# Patient Record
Sex: Male | Born: 1955 | Race: Black or African American | Hispanic: No | Marital: Single
Health system: Southern US, Community
[De-identification: ages and names within clinical notes are randomized; demographics above are authoritative.]

## PROBLEM LIST (undated history)

## (undated) DIAGNOSIS — M1990 Unspecified osteoarthritis, unspecified site: Secondary | ICD-10-CM

## (undated) DIAGNOSIS — I10 Essential (primary) hypertension: Secondary | ICD-10-CM

## (undated) DIAGNOSIS — F419 Anxiety disorder, unspecified: Secondary | ICD-10-CM

## (undated) DIAGNOSIS — F32A Depression, unspecified: Secondary | ICD-10-CM

## (undated) DIAGNOSIS — F329 Major depressive disorder, single episode, unspecified: Secondary | ICD-10-CM

## (undated) DIAGNOSIS — Z89619 Acquired absence of unspecified leg above knee: Secondary | ICD-10-CM

## (undated) HISTORY — PX: OTHER SURGICAL HISTORY: SHX169

## (undated) HISTORY — DX: Essential (primary) hypertension: I10

## (undated) HISTORY — DX: Acquired absence of unspecified leg above knee: Z89.619

## (undated) HISTORY — PX: INGUINAL HERNIA REPAIR: SUR1180

---
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
## 2006-02-09 ENCOUNTER — Ambulatory Visit: Payer: Self-pay | Admitting: Internal Medicine

## 2006-02-16 ENCOUNTER — Ambulatory Visit: Payer: Self-pay | Admitting: Internal Medicine

## 2007-08-16 DIAGNOSIS — Z89519 Acquired absence of unspecified leg below knee: Secondary | ICD-10-CM | POA: Insufficient documentation

## 2007-08-16 DIAGNOSIS — Z87898 Personal history of other specified conditions: Secondary | ICD-10-CM | POA: Insufficient documentation

## 2007-08-22 ENCOUNTER — Ambulatory Visit: Payer: Self-pay | Admitting: Internal Medicine

## 2007-08-22 DIAGNOSIS — E669 Obesity, unspecified: Secondary | ICD-10-CM

## 2007-08-28 LAB — CONVERTED CEMR LAB
BUN: 11 mg/dL (ref 6–23)
Basophils Absolute: 0 10*3/uL (ref 0.0–0.1)
CO2: 30 meq/L (ref 19–32)
Cholesterol: 222 mg/dL (ref 0–200)
Creatinine, Ser: 1.1 mg/dL (ref 0.4–1.5)
Eosinophils Relative: 2.9 % (ref 0.0–5.0)
GFR calc Af Amer: 91 mL/min
Glucose, Bld: 80 mg/dL (ref 70–99)
HCT: 41.5 % (ref 39.0–52.0)
HDL: 45.2 mg/dL (ref >39.0)
Hemoglobin: 13.9 g/dL (ref 13.0–17.0)
Lymphocytes Relative: 41.1 % (ref 12.0–46.0)
MCHC: 33.5 g/dL (ref 30.0–36.0)
MCV: 83.2 fL (ref 78.0–100.0)
Monocytes Absolute: 0.4 10*3/uL (ref 0.2–0.7)
Neutro Abs: 2.8 10*3/uL (ref 1.4–7.7)
Neutrophils Relative %: 48.5 % (ref 43.0–77.0)
Potassium: 4.2 meq/L (ref 3.5–5.1)
RBC: 4.99 M/uL (ref 4.22–5.81)
VLDL: 23 mg/dL (ref 0–40)
WBC: 5.7 10*3/uL (ref 4.5–10.5)

## 2007-09-11 ENCOUNTER — Encounter: Payer: Self-pay | Admitting: Internal Medicine

## 2007-10-26 DIAGNOSIS — I10 Essential (primary) hypertension: Secondary | ICD-10-CM

## 2007-10-26 HISTORY — DX: Essential (primary) hypertension: I10

## 2008-04-25 ENCOUNTER — Encounter: Payer: Self-pay | Admitting: Internal Medicine

## 2008-07-24 ENCOUNTER — Telehealth: Payer: Self-pay | Admitting: Internal Medicine

## 2008-07-31 ENCOUNTER — Ambulatory Visit: Payer: Self-pay | Admitting: Internal Medicine

## 2008-07-31 DIAGNOSIS — I10 Essential (primary) hypertension: Secondary | ICD-10-CM | POA: Insufficient documentation

## 2008-08-06 LAB — CONVERTED CEMR LAB
Calcium: 9.5 mg/dL (ref 8.4–10.5)
GFR calc Af Amer: 82 mL/min
GFR calc non Af Amer: 68 mL/min

## 2008-08-28 ENCOUNTER — Encounter (INDEPENDENT_AMBULATORY_CARE_PROVIDER_SITE_OTHER): Payer: Self-pay | Admitting: *Deleted

## 2008-08-30 ENCOUNTER — Encounter: Payer: Self-pay | Admitting: Internal Medicine

## 2008-10-09 ENCOUNTER — Telehealth: Payer: Self-pay | Admitting: Internal Medicine

## 2009-02-26 ENCOUNTER — Encounter: Payer: Self-pay | Admitting: Internal Medicine

## 2009-03-20 ENCOUNTER — Encounter: Payer: Self-pay | Admitting: Internal Medicine

## 2009-03-21 ENCOUNTER — Telehealth (INDEPENDENT_AMBULATORY_CARE_PROVIDER_SITE_OTHER): Payer: Self-pay | Admitting: *Deleted

## 2009-08-27 ENCOUNTER — Ambulatory Visit: Payer: Self-pay | Admitting: Internal Medicine

## 2009-09-15 ENCOUNTER — Encounter: Payer: Self-pay | Admitting: Internal Medicine

## 2009-09-29 ENCOUNTER — Encounter: Payer: Self-pay | Admitting: Internal Medicine

## 2010-01-29 ENCOUNTER — Encounter: Payer: Self-pay | Admitting: Internal Medicine

## 2010-01-29 ENCOUNTER — Telehealth (INDEPENDENT_AMBULATORY_CARE_PROVIDER_SITE_OTHER): Payer: Self-pay | Admitting: *Deleted

## 2010-01-30 ENCOUNTER — Encounter (INDEPENDENT_AMBULATORY_CARE_PROVIDER_SITE_OTHER): Payer: Self-pay | Admitting: *Deleted

## 2010-04-22 ENCOUNTER — Ambulatory Visit: Payer: Self-pay | Admitting: Internal Medicine

## 2010-04-22 DIAGNOSIS — F528 Other sexual dysfunction not due to a substance or known physiological condition: Secondary | ICD-10-CM | POA: Insufficient documentation

## 2010-04-28 ENCOUNTER — Encounter (INDEPENDENT_AMBULATORY_CARE_PROVIDER_SITE_OTHER): Payer: Self-pay | Admitting: *Deleted

## 2010-05-06 ENCOUNTER — Ambulatory Visit: Payer: Self-pay | Admitting: Internal Medicine

## 2010-05-07 ENCOUNTER — Ambulatory Visit: Payer: Self-pay | Admitting: Internal Medicine

## 2010-05-07 DIAGNOSIS — S0003XA Contusion of scalp, initial encounter: Secondary | ICD-10-CM | POA: Insufficient documentation

## 2010-05-07 DIAGNOSIS — S0083XA Contusion of other part of head, initial encounter: Secondary | ICD-10-CM

## 2010-05-07 DIAGNOSIS — S1093XA Contusion of unspecified part of neck, initial encounter: Secondary | ICD-10-CM

## 2010-05-08 LAB — CONVERTED CEMR LAB
ALT: 17 units/L (ref 0–53)
Basophils Absolute: 0 10*3/uL (ref 0.0–0.1)
Basophils Relative: 0.6 % (ref 0.0–3.0)
Calcium: 9.6 mg/dL (ref 8.4–10.5)
Cholesterol: 244 mg/dL — ABNORMAL HIGH (ref 0–200)
Creatinine, Ser: 1.1 mg/dL (ref 0.4–1.5)
Eosinophils Relative: 2.7 % (ref 0.0–5.0)
Glucose, Bld: 98 mg/dL (ref 70–99)
HCT: 43.3 % (ref 39.0–52.0)
Hemoglobin: 14.1 g/dL (ref 13.0–17.0)
Lymphocytes Relative: 30.5 % (ref 12.0–46.0)
Lymphs Abs: 1.8 10*3/uL (ref 0.7–4.0)
MCHC: 32.4 g/dL (ref 30.0–36.0)
MCV: 85.3 fL (ref 78.0–100.0)
Monocytes Absolute: 0.5 10*3/uL (ref 0.1–1.0)
Monocytes Relative: 9.1 % (ref 3.0–12.0)
Neutro Abs: 3.3 10*3/uL (ref 1.4–7.7)
Neutrophils Relative %: 57.1 % (ref 43.0–77.0)
Platelets: 178 10*3/uL (ref 150.0–400.0)
Potassium: 4.1 meq/L (ref 3.5–5.1)
Total CHOL/HDL Ratio: 5
Triglycerides: 130 mg/dL (ref 0.0–149.0)

## 2010-05-15 ENCOUNTER — Telehealth: Payer: Self-pay | Admitting: Family Medicine

## 2010-05-16 ENCOUNTER — Ambulatory Visit: Payer: Self-pay | Admitting: Family Medicine

## 2010-05-16 DIAGNOSIS — R209 Unspecified disturbances of skin sensation: Secondary | ICD-10-CM | POA: Insufficient documentation

## 2010-05-16 DIAGNOSIS — S060XAA Concussion with loss of consciousness status unknown, initial encounter: Secondary | ICD-10-CM | POA: Insufficient documentation

## 2010-05-16 DIAGNOSIS — S060X9A Concussion with loss of consciousness of unspecified duration, initial encounter: Secondary | ICD-10-CM

## 2010-05-28 ENCOUNTER — Telehealth (INDEPENDENT_AMBULATORY_CARE_PROVIDER_SITE_OTHER): Payer: Self-pay | Admitting: *Deleted

## 2010-06-05 ENCOUNTER — Telehealth: Payer: Self-pay | Admitting: Internal Medicine

## 2010-06-08 ENCOUNTER — Encounter: Admission: RE | Admit: 2010-06-08 | Discharge: 2010-06-08 | Payer: Self-pay | Admitting: Internal Medicine

## 2010-06-08 ENCOUNTER — Encounter: Payer: Self-pay | Admitting: Internal Medicine

## 2010-06-15 ENCOUNTER — Ambulatory Visit: Payer: Self-pay | Admitting: Internal Medicine

## 2010-06-16 ENCOUNTER — Encounter: Payer: Self-pay | Admitting: Internal Medicine

## 2010-08-13 ENCOUNTER — Encounter: Payer: Self-pay | Admitting: Internal Medicine

## 2010-09-01 ENCOUNTER — Encounter: Payer: Self-pay | Admitting: Internal Medicine

## 2010-11-24 NOTE — Consult Note (Signed)
Summary: ulnar neuropathy, RX [facility]  [facility]   Imported By: Lanelle Bal 08/25/2010 10:16:03  _____________________________________________________________________  External Attachment:    Type:   Image     Comment:   External Document

## 2010-11-24 NOTE — Assessment & Plan Note (Signed)
Summary: headache/cbs   Vital Signs:  Patient profile:   55 year old male Weight:      254 pounds Temp:     97.3 degrees F oral Pulse rate:   67 / minute Pulse rhythm:   regular BP sitting:   140 / 88  (left arm) Cuff size:   large  Vitals Entered By: [name] CMA (May 07, 2010 2:10 PM) CC: Pt here hit head 3-4 weeks ago had a HA that went away now has returned.  Comments - He feels it on the top of his head. -Waking up with it in the am.    History of Present Illness: 4 weeks ago while going down the stairs, he had an accident and fell forward, he hit the wall with the top of his head. It hurt, he felt dizzy the rest of the day. The majority of the  pain went away in 48 hours however since then, on and off, he has a pain in the top of his head right at the place where he hit the wall.  ROS No further dizziness No neck pain No nausea vomiting  Allergies (verified): No Known Drug Allergies  Past History:  Past Medical History: Reviewed history from 07/31/2008 and no changes required. 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 ***R***  BKA PMH-FH-SH reviewed for relevance  Social History: Reviewed history from 04/22/2010 and no changes required. Single 4 kids, boys  tobacco-- no ETOH-- no exercise-- active  diet--  does watch  occupation-- painter   Review of Systems      See HPI  Physical Exam  General:  alert, well-developed, and well-nourished.   Head:  palpation of the top of the head showed no mass, crepitus, deformities Neurologic:  alert & oriented X3, cranial nerves II-XII intact, strength normal in all extremities, and gait normal.   Psych:  Oriented X3, good eye contact, not anxious appearing, and not depressed appearing.     Impression & Recommendations:  Problem # 1:  CONTUSION, HEAD (ICD-920) head contusion, pain seems more local (  skeletal)  that an  actual  brain contusion plan: Ibuprofen CT of the head without to be sure that area is okay If the CT is negative and the pain continued for more than 4 weeks, he will let me know  Orders: Radiology Referral (Radiology)  Complete Medication List: 1)  Prosthesis For The Right Leg  .... Reason: loss of le volume, current prosthesis is loose 2)  Felodipine 5 Mg Xr24h-tab (Felodipine) .[name].. 1 by mouth once daily 3)  Viagra 100 Mg Tabs (Sildenafil citrate) .... Half or one tablet as directed once a day

## 2010-11-24 NOTE — Progress Notes (Signed)
Summary: MRI ORDER FROM 05-16-2010  Phone Note Call from Patient Call back at [phone]   Caller: Patient Summary of Call: Pt left voice message requesting a return call re: status of MRI appt.  See phone note of 05/28/10.  Left message on machine to return my call. Nicki Guadalajara Fergerson CMA Duncan Dull)  June 05, 2010 12:05 PM   Follow-up for Phone Call        Pt needs his MRI scheduled- he states he has not heard anything. [name] CMA  June 05, 2010 2:21 PM   Additional Follow-up for Phone Call Additional follow up Details #1::        FYI......Marland KitchenDr. Drue Novel authorized MRI to be ordered through phone note on 05-28-2010, but MRI was suggested on 05-16-2010 when patient was seen by Seymour Bars, DO.  Referral was never entered into EMR, but per phone note 05-28-2010, looks like info was faxed to [facility] for them to contact patient, they do not contact patient's to schedule.  It has been almost 3 weeks since MRI was supposed to be scheduled.  Ok for me to enter the referral? Additional Follow-up by: Magdalen Spatz [facility],  June 05, 2010 2:49 PM    Additional Follow-up for Phone Call Additional follow up Details #2::    Yes. [name] CMA  June 05, 2010 2:52 PM yes , see previos note Jerami Tammen E. Jeslyn Amsler MD  June 07, 2010 10:14 AM     Additional Follow-up for Phone Call Additional follow up Details #3:: Details for Additional Follow-up Action Taken: PATIENT'S APPT IS 06-08-2010, ARRIVE 8:45PM FOR A 9PM MRI.  ALSO, APPROVED BY INSURANCE.  I WILL INFORM PATIENT.  Additional Follow-up by: Magdalen Spatz [facility],  June 08, 2010 9:00 AM

## 2010-11-24 NOTE — Letter (Signed)
Summary: [facility]  [facility]   Imported By: Lanelle Bal 02/04/2010 08:49:51  _____________________________________________________________________  External Attachment:    Type:   Image     Comment:   External Document

## 2010-11-24 NOTE — Letter (Signed)
Summary: *Referral Letter  [facility]  [address] [place], [place] [address]   Phone: [phone]  Fax: [phone]    06/16/2010 Neurology Thank you in advance for agreeing to see my patient:  Russell Garcia [address] [place], [place]  [address]  Phone: [phone]  Reason for Referral:  55 year old gentleman who fell down stairs ( late June) and injured his head; since then has  developed a headache . He was seen at the office 4 weeks after the accident on 05-07-10, a CT of the head was done and it was normal. He was re-evaluated later with numbness in the upper extremity, a  MRI was done  06-08-10 and is reported as follows:  Impression 1.  No evidence of acute ischemia. 2.  Non specific Frieze matter changes probably represent areas of ischemic gliosis due to small vessel disease due to hypertension and/or diabetes with less likely possibility of a demyelinating process or vasculitis. 3. Moderate inflammatory thickening of the mucosa of the paranasal sinuses. 4.  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  please evaluate the patient in reference to his persistent headache and upper extremity numbness.      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  Sincerely,  Jose E. Paz MD

## 2010-11-24 NOTE — Letter (Signed)
Summary: [facility]: Immunoassay Fecal Occult Blood (iFOB) Order Form  [facility]  [address] [place], [place] [address]   Phone: [phone]  Fax: [phone]       Lab: Immunoassay Fecal Occult Blood (iFOB) Order Form   April 28, 2010 MRN: [id number]   Kamryn Brisbon [birth year]   Physicican Name:____jose,paz md_____________________  Diagnosis Code:_____v76.51_____________________      [name] CMA

## 2010-11-24 NOTE — Assessment & Plan Note (Signed)
Summary: finger numbness///lch   Vital Signs:  Patient profile:   55 year old male Height:      72 inches Weight:      253.50 pounds BMI:     34.51 O2 Sat:      97 % on Room air Temp:     98.2 degrees F oral Pulse rate:   77 / minute BP sitting:   138 / 90  (left arm) Cuff size:   large  Vitals Entered By: Margaret Pyle, CMA (May 16, 2010 9:49 AM)  O2 Flow:  Room air CC: Numbness & Tingling of LT ring, little finger x 4-6 wks/ DBD   Primary Care Provider:  Nolon Rod. Paz MD  CC:  Numbness & Tingling of LT ring and little finger x 4-6 wks/ DBD.  History of Present Illness: 55 yo AAM presents for a head injury 6 wks ago.  He was walking up steps and hit his head on a brick wall over the top of his head.  He had a CT scan done 3 wks ago for new onset HAs and it was normal.  He started having L 5th lateral 4th digit on the L numbness and tingling x 4 days.  Denies any weakness in the hand.  he is R handed.  His headaches have continued.  Taking ASA and tylenol.  Has a little occiptial neck pain.  Denies any vision change.  No nausea.  Unsure of any memory problems.  He had no LOC with the original injury.  Allergies (verified): No Known Drug Allergies  Past History:  Past Medical History: Reviewed history from 07/31/2008 and no changes required. 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 ***R***  BKA  Social History: Reviewed history from 04/22/2010 and no changes required. Single 4 kids, boys  tobacco-- no ETOH-- no exercise-- active  diet--  does watch  occupation-- painter   Review of Systems      See HPI  Physical Exam  General:  alert, well-developed, well-nourished, and well-hydrated.   Head:  normocephalic and atraumatic.  tender midline parietal region Eyes:  pupils equal, pupils round, and pupils reactive to light.   Mouth:  pharynx pink and  moist.   Neck:  supple, full ROM, and no masses.   Lungs:  Normal respiratory effort, chest expands symmetrically. Lungs are clear to auscultation, no crackles or wheezes. Heart:  normal rate, regular rhythm, and no murmur.   Msk:  full L UE active ROM Pulses:  2+ radial and ulnar  pulses Extremities:  no UE edema Neurologic:  cranial nerves II-XII intact and gait normal.  subjective decreased sensation to touch over the L 4th and 5th digits with full strength and ROM Skin:  color normal.   Psych:  good eye contact, not anxious appearing, and not depressed appearing.     Impression & Recommendations:  Problem # 1:  NUMBNESS (ICD-782.0) Assessment New 4th and 5th digit subjective nubmenss, new with normal strength and ROM in the L hand.  BP at goal.  CT brain normal 3 wks ago.  Will need MRI brain to r/o central process.   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  Reviewed his normal Ct brain from 3 wks ago.  Will start him  on Topiramate for chronic daily HA - 25 mg at bedtime x 1 wk then increase to 50 mg at bedtime in order to cut back on use of ASA and NSAIDs.  He is to f/u with Dr Drue Novel in the next wk.  Should be able to taper off Topiramate as post concussive HAs improve. Orders: T-MRI Head w/o contrast ([address])  Complete Medication List: 1)  Prosthesis For The Right Leg  .... Reason: loss of le volume, current prosthesis is loose 2)  Felodipine 5 Mg Xr24h-tab (Felodipine) .[name].. 1 by mouth once daily 3)  Viagra 100 Mg Tabs (Sildenafil citrate) .... Half or one tablet as directed once a day 4)  Topiramate 25 Mg Tabs (Topiramate) .[name].. 1 tab by mouth at bedtime x 1 wk then increase to 2 tabs by mouth qhs 5)  Tramadol Hcl 50 Mg Tabs (Tramadol hcl) .[name].. 1 tab by mouth two times a day as needed headache pain  Patient Instructions: 1)  Will set you MRI brain for next  wk thru Dr Leta Jungling office. 2)  Start on Topiramate at bedtime for HA prevention. 3)  Take 1 tab for the first wk then go up to 2 tabs. 4)  For HA pain, use Tramadol as needed. 5)  Return to see Dr Drue Novel for post concussive syndrome, finger numbness  next wk. Prescriptions: TRAMADOL HCL 50 MG TABS (TRAMADOL HCL) 1 tab by mouth two times a day as needed headache pain  #40 x 0   Entered and Authorized by:   Seymour Bars DO   Signed by:   Seymour Bars DO on 05/16/2010   Method used:   [facility]        [facility] [address] [phone]* (retail)       [address]       [place], [place]  [address]       Ph: [phone] or [phone]       Fax: [phone]   RxID:   [id number] TOPIRAMATE 25 MG TABS (TOPIRAMATE) 1 tab by mouth at bedtime x 1 wk then increase to 2 tabs by mouth qhs  #60 x 0   Entered and Authorized by:   Seymour Bars DO   Signed by:   Seymour Bars DO on 05/16/2010   Method used:   [facility]        [facility] [address] [phone]* (retail)       [address]       [place], [place]  [address]       Ph: [phone] or [phone]       Fax: [phone]   RxID:   [id number]

## 2010-11-24 NOTE — Miscellaneous (Signed)
Summary: Orders Update   Clinical Lists Changes  Orders: Added new Referral order of Radiology Referral (Radiology) - Signed 

## 2010-11-24 NOTE — Progress Notes (Signed)
Summary: Finger numbness  Phone Note Call from Patient Call back at Home Phone [phone]   Caller: Patient Summary of Call: Patient called c/o of numbesss in 3 of fingers in his left hand. He also need his BP medicine refilled because he lost it. FELODIPINE 5 MG XR24H-TAB. Please advise. Initial call taken by: Harold Barban,  May 15, 2010 3:31 PM  Follow-up for Phone Call        pt needs to be seen at Saturday clinic or UC for evaluation of sxs.  unable to give advice over the phone w/out hx- elbow/shoulder/neck/wrist injury that could be compressing nerve.  ok to refill meds Follow-up by: Neena Rhymes MD,  May 15, 2010 3:53 PM  Additional Follow-up for Phone Call Additional follow up Details #1::        Patient is ok with a Saturday clinic appt.   [facility] [address]  Additional Follow-up by: Harold Barban,  May 15, 2010 4:17 PM    Additional Follow-up for Phone Call Additional follow up Details #2::    Patient appt has been made for sat clinic and per Dr. Beverely Low it is ok to refill. Done. Follow-up by: Lucious Groves CMA,  May 15, 2010 4:23 PM  Prescriptions: FELODIPINE 5 MG XR24H-TAB (FELODIPINE) 1 by mouth once daily  #30 Tablet x 1   Entered by:   Lucious Groves CMA   Authorized by:   Neena Rhymes MD   Signed by:   Lucious Groves CMA on 05/15/2010   Method used:   [facility]        [facility] [address] [phone]* (retail)       [address]       [place], [place]  [address]       Ph: [phone] or [phone]       Fax: [phone]   RxID:   [id number]

## 2010-11-24 NOTE — Progress Notes (Signed)
Summary: due cpx  Phone Note Outgoing Call Call back at [facility] Phone [phone] Call back at Work Phone [phone]   Summary of Call: Patient is due CPX with Dr. Alanson Aly [facility]  January 29, 2010 8:55 AM     Additional Follow-up for Phone Call Additional follow up Details #2::    LMTCB Follow-up by: Barb Merino,  January 29, 2010 8:57 AM  Additional Follow-up for Phone Call Additional follow up Details #3:: Details for Additional Follow-up Action Taken: mailed a letter Additional Follow-up by: Barb Merino,  January 30, 2010 10:26 AM

## 2010-11-24 NOTE — Assessment & Plan Note (Signed)
Summary: discuss MRI results//lch   Vital Signs:  Patient profile:   55 year old male Weight:      259 pounds Pulse rate:   90 / minute Pulse rhythm:   regular BP sitting:   132 / 80  (left arm) Cuff size:   large  Vitals Entered By: [name] CMA (June 15, 2010 8:32 AM) CC: Discuss MRI results- Fasting    History of Present Illness: here to discuss the recent brain MRI   Current Medications (verified): 1)  Prosthesis For The Right Leg .... Reason: Loss of Le Volume, Current Prosthesis Is Loose 2)  Felodipine 5 Mg Xr24h-Tab (Felodipine) .[name].. 1 By Mouth Once Daily 3)  Viagra 100 Mg Tabs (Sildenafil Citrate) .... Half or One Tablet As Directed Once A Day 4)  Tramadol Hcl 50 Mg Tabs (Tramadol Hcl) .[name].. 1 Tab By Mouth Two Times A Day As Needed Headache Pain  Allergies (verified): No Known Drug Allergies  Past History:  Past Medical History: Reviewed history from 07/31/2008 and no changes required. 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 ***R***  BKA  Social History: Reviewed history from 04/22/2010 and no changes required. Single 4 kids, boys  tobacco-- no ETOH-- no exercise-- active  diet--  does watch  occupation-- painter   Review of Systems       Continue with headaches daily he also developed   left hand numbness, symptoms resolved except for persistent numbness of the fourth and fifth left fingers Mild neck pain, denies left elbow pain he was prescribed Topamax for prevention of headaches but he never tried them He was prescribed Ultram for pain as well but it doesn't help any better than an aspirin does. Patient self discontinued  Physical Exam  General:  alert and well-developed.   Neurologic:  alert & oriented X3.   EOMI motor and face symetric DTRs UEs normal pinprick UE normal as well  Psych:  Oriented X3, memory intact for recent  and remote, normally interactive, good eye contact, not anxious appearing, and not depressed appearing.     Impression & Recommendations:  Problem # 1:  CONCUSSION (ICD-850.9)  persistent headache after fall and head injury now also complaining of left upper extremity paresthesias MRI is not completely normal ----> see report  Plan: neurology referral, see   letter  Orders: Neurology Referral (Neuro)  Problem # 2:  NUMBNESS (ICD-782.0) see #1  Complete Medication List: 1)  Prosthesis For The Right Leg  .... Reason: loss of le volume, current prosthesis is loose 2)  Felodipine 5 Mg Xr24h-tab (Felodipine) .[name].. 1 by mouth once daily 3)  Viagra 100 Mg Tabs (Sildenafil citrate) .... Half or one tablet as directed once a day 4)  Tramadol Hcl 50 Mg Tabs (Tramadol hcl) .[name].. 1 tab by mouth two times a day as needed headache pain

## 2010-11-24 NOTE — Assessment & Plan Note (Signed)
Summary: CPX,WILL BE FASTING, [employer] INS/RH......   Vital Signs:  Patient profile:   55 year old male Height:      72 inches Weight:      255.50 pounds BMI:     34.78 Pulse rate:   74 / minute Pulse rhythm:   regular BP sitting:   142 / 88  (left arm) Cuff size:   large  Vitals Entered By: [name] CMA (April 22, 2010 12:46 PM) CC: CPX: not fasting   History of Present Illness: CPX c/o ED since he started BP meds decrease quality of erections no problems w/  libido   Allergies (verified): No Known Drug Allergies  Past History:  Past Medical History: Reviewed history from 07/31/2008 and no changes required. 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 leukemia-- F MI-- mother (early 55 y/o?) DM--no colon ca--no prostate ca--no  Social History: Single 4 kids, boys  tobacco-- no ETOH-- no exercise-- active  diet--  does watch  occupation-- Education administrator   Review of Systems General:  Denies fatigue and fever; some wt loss, eating healthier . CV:  Denies chest pain or discomfort and swelling of feet. Resp:  Denies cough and shortness of breath. GI:  Denies bloody stools, diarrhea, nausea, and vomiting. GU:  Denies dysuria, hematuria, urinary frequency, and urinary hesitancy. Psych:  Denies anxiety and depression.  Physical Exam  General:  alert and well-developed.   Neck:  no masses, no thyromegaly, and normal carotid upstroke.   Lungs:  normal respiratory effort, no intercostal retractions, no accessory muscle use, and normal breath sounds.   Heart:  normal rate, regular rhythm, and no murmur.   Abdomen:  soft, non-tender, no distention, no masses, no guarding, and no rigidity.   Rectal:  No external abnormalities noted. Normal sphincter tone. No rectal masses or tenderness. Hemoccult negative Prostate:  Prostate gland firm  and smooth, no enlargement, nodularity, tenderness, mass, asymmetry or induration. Extremities:  no lower extremity edema on the left Psych:  Cognition and judgment appear intact. Alert and cooperative with normal attention span and concentration. not anxious appearing and not depressed appearing.     Impression & Recommendations:  Problem # 1:  HEALTH SCREENING (ICD-V70.0) Td 07 never Cscope  Colonoscopy Vs.iFOB cards reviewed w/ pt. Provided  iFOB but he will  call if he decides to have a  colonoscopy  encouraged to continue his healthy lifestyle, labs  Problem # 2:  HYPERTENSION (ICD-401.9) see instructions His updated medication list for this problem includes:    Felodipine 5 Mg Xr24h-tab (Felodipine) .[name]... 1 by mouth once daily  BP today: 142/88 Prior BP: 170/110 (08/27/2009)  Labs Reviewed: K+: 3.7 (07/31/2008) Creat: : 1.2 (07/31/2008)   Chol: 222 (08/22/2007)   HDL: 45.2 (08/22/2007)   LDL: DEL (08/22/2007)   TG: 113 (08/22/2007)  Problem # 5:  ERECTILE DYSFUNCTION, NON-ORGANIC (ICD-302.72) problems with erections since he stopped taking BP meds Trial with Viagra, how to use it  and side effects discussed  His updated medication list for this problem includes:    Viagra 100 Mg Tabs (Sildenafil citrate) ..... Half or one tablet as directed once a day  Problem # 6:  AMPUTATION, BELOW KNEE, RIGHT, HX OF (ICD-V49.75) has a relatively new prosthesis Complaining of sweating a lot with it He will call if he needs a prescription to switch to  a new model that make sweating less of a problem  Complete Medication List: 1)  Prosthesis For The Right Leg  .... Reason: loss of le volume, current prosthesis is loose 2)  Felodipine 5 Mg Xr24h-tab (Felodipine) .[name].. 1 by mouth once daily 3)  Viagra 100 Mg Tabs (Sildenafil citrate) .... Half or one tablet as directed once a day  Patient Instructions: 1)  Check your blood pressure 2 or 3 times a week. If it is more than 140/85  consistently,please let us know 2)  ------------------------------------------------ 3)  come back fasting for blood work on him 4)  Dx V70: FLP, AST, ALT, BMP, CBC, TSH, PSA 5)  ------------------------------------------------ 6)  Please schedule a follow-up appointment in 6 months .  Prescriptions: VIAGRA 100 MG TABS (SILDENAFIL CITRATE) half or one tablet as directed once a day  #10 x 6   Entered and Authorized by:   Elita Quick E. Paz MD   Signed by:   Nolon Rod. Paz MD on 04/22/2010   Method used:   Print then Give to Patient   RxID:   [id number]

## 2010-11-24 NOTE — Letter (Signed)
Summary: Primary Care Appointment Letter  [facility]  [address] [place], [place] [address]   Phone: [phone]  Fax: [phone]    01/30/2010 MRN: [id number]  [facility] [place] [address] [place], [place]  [address]  Dear Mr. TIPPS,   Your Primary Care Physician Mayfield E. Paz MD has indicated that:    ____x___it is time to schedule an appointment. Due for a physical    _______you missed your appointment on______ and need to call and          reschedule.    _______you need to have lab work done.    _______you need to schedule an appointment discuss lab or test results.    _______you need to call to reschedule your appointment that is                       scheduled on _________.     Please call our office as soon as possible. Our phone number is [phone]______. Please press option 1. Our office is open 8a-12noon and 1p-5p, Monday through Friday.     Thank you,    [facility]

## 2010-11-24 NOTE — Progress Notes (Signed)
Summary: MRI  Phone Note Call from Patient Call back at Home Phone [phone]   Caller: Patient Summary of Call: Patient called about his MRI that was supposed to sch. He was seen @ the Saturday clinic on 7.23.11 for numbness in his fingers. The order was put in for the MRI but never set up. Not sure if patient was given the order to go to the hospital to have it done or if it was overlooked. Please adivse.  Initial call taken by: Harold Barban,  May 28, 2010 9:24 AM  Follow-up for Phone Call        okay to schedule the MRI as planned Be sure he has a followup with me after the MRI tell patient that if symptoms severe or getting worse, he needs to call us Follow-up by: [facility] E. Paz MD,  May 29, 2010 10:33 AM  Additional Follow-up for Phone Call Additional follow up Details #1::        Left detailed message informing him the order is sent to [facility] and they will be contacting him to sch appt.  Additional Follow-up by: Harold Barban,  May 29, 2010 10:40 AM

## 2011-02-19 ENCOUNTER — Other Ambulatory Visit: Payer: Self-pay | Admitting: Internal Medicine

## 2011-03-09 ENCOUNTER — Encounter: Payer: Self-pay | Admitting: Internal Medicine

## 2011-03-09 ENCOUNTER — Ambulatory Visit (INDEPENDENT_AMBULATORY_CARE_PROVIDER_SITE_OTHER): Payer: BC Managed Care – PPO | Admitting: Internal Medicine

## 2011-03-09 DIAGNOSIS — L309 Dermatitis, unspecified: Secondary | ICD-10-CM

## 2011-03-09 DIAGNOSIS — L259 Unspecified contact dermatitis, unspecified cause: Secondary | ICD-10-CM

## 2011-03-09 DIAGNOSIS — S0003XA Contusion of scalp, initial encounter: Secondary | ICD-10-CM

## 2011-03-09 DIAGNOSIS — S1093XA Contusion of unspecified part of neck, initial encounter: Secondary | ICD-10-CM

## 2011-03-09 DIAGNOSIS — Z0000 Encounter for general adult medical examination without abnormal findings: Secondary | ICD-10-CM | POA: Insufficient documentation

## 2011-03-09 DIAGNOSIS — E785 Hyperlipidemia, unspecified: Secondary | ICD-10-CM

## 2011-03-09 LAB — LIPID PANEL
Cholesterol: 194 mg/dL (ref 0–200)
LDL Cholesterol: 125 mg/dL — ABNORMAL HIGH (ref 0–99)
Total CHOL/HDL Ratio: 4
VLDL: 18.6 mg/dL (ref 0.0–40.0)

## 2011-03-09 NOTE — Assessment & Plan Note (Signed)
LDL in the 160s, + FH of CAD Recheck cholesterol, goal LDL <130, 100 if possible

## 2011-03-09 NOTE — Assessment & Plan Note (Signed)
He was seen last year after a concussion with persistent headache and upper extremity paresthesias. MRI 05/2010 was as follows: 1.  No evidence of acute ischemia. 2.  Non specific [place] matter changes probably represent areas of ischemic gliosis due to small vessel disease due to hypertension and/or diabetes with less likely possibility of a demyelinating process or vasculitis. 3. Moderate inflammatory thickening of the mucosa of the paranasal sinuses. 4.  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 He subsequently saw neurology. 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 It was felt that the problem was not related with a head concussion.

## 2011-03-09 NOTE — Progress Notes (Signed)
  Subjective:    Patient ID: Russell Garcia, male    DOB: 02-Nov-1955, 55 y.o.   MRN: [id number]  HPI Recently he lost a friend, he is quite concerned about his own health. Having a lot of problems with the skin in the stump, R leg . Has been using OTC cortisone creams. They are manufacturing a new prosthesis but in the meantime he is concerned. He wonders about skin cancer. He also has a skin lesion on the right eye that has been there for a while and would like it out. Also request a colonoscopy.  Past Medical History  Diagnosis Date  . Hypertension 2009  . 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 R bka      Review of Systems No chest pain or shortness of breath No nausea vomiting or diarrhea. Labs are reviewed, cholesterol was noted to be elevated in the past, he has a history of heart disease in the family. See assessment and plan.    Objective:   Physical Exam Alert oriented in no apparent distress. Skin at the tip of the right stump with chronic changes, slight maceration, no hyperpigmentation. He has a 2 mm cystlike lesion in the corner of the right eye.         Assessment & Plan:

## 2011-03-09 NOTE — Assessment & Plan Note (Signed)
Due for a CPX 6-12 but likes to go ahead and be referred for a Cscope----> done

## 2011-03-09 NOTE — Assessment & Plan Note (Addendum)
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 cyst in the corner of the right eye.  Refer to derm to be sure nothing serious is going on on the stump and for consideration of excision of a cyst close to the R eye although that may need to be performed by ophthalmology.

## 2011-03-10 ENCOUNTER — Encounter: Payer: Self-pay | Admitting: Internal Medicine

## 2011-03-11 ENCOUNTER — Telehealth: Payer: Self-pay | Admitting: *Deleted

## 2011-03-11 NOTE — Telephone Encounter (Signed)
Message copied by [name] on Thu Mar 11, 2011  9:49 AM ------      Message from: Russell Garcia      Created: Thu Mar 11, 2011  6:38 AM       Advise patient:      Cholesterol is better, good results!

## 2011-03-11 NOTE — Telephone Encounter (Signed)
Message left for patient to return my call.  

## 2011-03-12 NOTE — Telephone Encounter (Signed)
Message left for patient to return my call.  

## 2011-03-15 NOTE — Telephone Encounter (Signed)
Message left for patient to return my call.  

## 2011-03-17 ENCOUNTER — Encounter: Payer: Self-pay | Admitting: *Deleted

## 2011-03-17 NOTE — Telephone Encounter (Signed)
Will mail letter.  

## 2011-03-30 ENCOUNTER — Other Ambulatory Visit: Payer: Self-pay | Admitting: Internal Medicine

## 2011-03-31 ENCOUNTER — Telehealth: Payer: Self-pay | Admitting: *Deleted

## 2011-03-31 MED ORDER — FELODIPINE ER 5 MG PO TB24: 5.0000 mg | ORAL_TABLET | Freq: Every day | ORAL | Status: DC

## 2011-03-31 NOTE — Telephone Encounter (Signed)
Has CPX and labs for 05/31/2011

## 2011-03-31 NOTE — Telephone Encounter (Signed)
Sorry- sent in a 3 month supply. Yes just needs august appt.

## 2011-03-31 NOTE — Telephone Encounter (Signed)
Does patient need followup appt from 5/15 visit??   or ----can he wait until August because directions on 5/15 visit say to "return in about 3 months (around 06/09/2011) for a physical, fasting."  He says he keeps getting a one-month supply for his Felodipine instead of three months---if he makes an August appt, can he get a three month supply??

## 2011-03-31 NOTE — Telephone Encounter (Signed)
Pt is due for an appt.  

## 2011-04-05 ENCOUNTER — Telehealth: Payer: Self-pay | Admitting: *Deleted

## 2011-04-05 NOTE — Telephone Encounter (Signed)
Pt called and would like to speak only with doctor about recent visit and referral that was done. Please call pt.

## 2011-04-06 NOTE — Telephone Encounter (Signed)
LMOM. Asked pt to call in AM

## 2011-04-09 ENCOUNTER — Encounter: Payer: Self-pay | Admitting: Gastroenterology

## 2011-04-09 ENCOUNTER — Ambulatory Visit (AMBULATORY_SURGERY_CENTER): Payer: BC Managed Care – PPO | Admitting: *Deleted

## 2011-04-09 VITALS — Ht 72.0 in | Wt 262.0 lb

## 2011-04-09 DIAGNOSIS — Z1211 Encounter for screening for malignant neoplasm of colon: Secondary | ICD-10-CM

## 2011-04-09 MED ORDER — PEG-KCL-NACL-NASULF-NA ASC-C 100 G PO SOLR: ORAL | Status: DC

## 2011-04-23 ENCOUNTER — Other Ambulatory Visit: Payer: BC Managed Care – PPO | Admitting: Gastroenterology

## 2011-04-30 ENCOUNTER — Telehealth: Payer: Self-pay

## 2011-04-30 ENCOUNTER — Encounter: Payer: Self-pay | Admitting: Internal Medicine

## 2011-04-30 ENCOUNTER — Other Ambulatory Visit: Payer: BC Managed Care – PPO | Admitting: Internal Medicine

## 2011-04-30 NOTE — Telephone Encounter (Signed)
Multiple unsuccessful attempts made to reach pt

## 2011-05-31 ENCOUNTER — Encounter: Payer: BC Managed Care – PPO | Admitting: Internal Medicine

## 2011-05-31 DIAGNOSIS — Z0289 Encounter for other administrative examinations: Secondary | ICD-10-CM

## 2011-06-08 ENCOUNTER — Telehealth: Payer: Self-pay | Admitting: Internal Medicine

## 2011-06-08 NOTE — Telephone Encounter (Signed)
Pt called left msg on voicemail says he was returning Dr. Leta Jungling phone call don't see any information to indicate that Dr. Drue Novel had called except for on 04/05/11.   Left msg on voicemail to have pt return call.

## 2011-09-10 ENCOUNTER — Other Ambulatory Visit: Payer: Self-pay | Admitting: Internal Medicine

## 2011-09-10 MED ORDER — FELODIPINE ER 5 MG PO TB24: 5.0000 mg | ORAL_TABLET | Freq: Every day | ORAL | Status: DC

## 2011-09-10 NOTE — Telephone Encounter (Signed)
DONE

## 2011-11-27 ENCOUNTER — Other Ambulatory Visit: Payer: Self-pay | Admitting: Internal Medicine

## 2011-11-29 NOTE — Telephone Encounter (Signed)
Refill done.  

## 2012-02-02 ENCOUNTER — Telehealth: Payer: Self-pay | Admitting: Internal Medicine

## 2012-02-02 NOTE — Telephone Encounter (Signed)
Thank you :)

## 2012-02-02 NOTE — Telephone Encounter (Signed)
Patient made a CPE 5.17.13 @ 1pm, please note patient no longer has insurance & I did tell him he would have to pay $145.00 up front. I also told him to call pharmacy for refills, they would send electronic.

## 2012-02-27 ENCOUNTER — Other Ambulatory Visit: Payer: Self-pay | Admitting: Internal Medicine

## 2012-02-28 NOTE — Telephone Encounter (Signed)
Refill done.  

## 2012-03-10 ENCOUNTER — Encounter: Payer: BC Managed Care – PPO | Admitting: Internal Medicine

## 2012-03-10 DIAGNOSIS — Z0289 Encounter for other administrative examinations: Secondary | ICD-10-CM

## 2012-03-14 ENCOUNTER — Ambulatory Visit (INDEPENDENT_AMBULATORY_CARE_PROVIDER_SITE_OTHER): Payer: Self-pay | Admitting: Internal Medicine

## 2012-03-14 VITALS — BP 148/98 | HR 68 | Temp 97.5°F | Wt 267.0 lb

## 2012-03-14 DIAGNOSIS — S88119A Complete traumatic amputation at level between knee and ankle, unspecified lower leg, initial encounter: Secondary | ICD-10-CM

## 2012-03-14 DIAGNOSIS — I10 Essential (primary) hypertension: Secondary | ICD-10-CM

## 2012-03-14 MED ORDER — FELODIPINE ER 5 MG PO TB24: 5.0000 mg | ORAL_TABLET | Freq: Every day | ORAL | Status: DC

## 2012-03-14 NOTE — Assessment & Plan Note (Signed)
Restart medications, prescription provided. See  instructions.

## 2012-03-14 NOTE — Progress Notes (Signed)
  Subjective:    Patient ID: Russell Garcia, male    DOB: 25-Jan-1956, 56 y.o.   MRN: [id number]  HPI Routine office visit Hypertension, run out of BP meds few months ago, not ambulatory BPs here at BP today elevated. He has chronic leg pain, wonders about disability.  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 Tobacco-- no EtOH--no Occupation --Education administrator    Review of Systems  no chest pain or shortness of breath Occasional headache in the mornings.     Objective:   Physical Exam General -- alert, well-developed.No apparent distress.  Lungs -- normal respiratory effort, no intercostal retractions, no accessory muscle use, and normal breath sounds.   Heart-- normal rate, regular rhythm, no murmur, and no gallop.   Neurologic-- alert & oriented X3 Psych-- Cognition and judgment appear intact. Alert and cooperative with normal attention span and concentration.  not anxious appearing and not depressed appearing.      Assessment & Plan:

## 2012-03-14 NOTE — Patient Instructions (Addendum)
Check the  blood pressure 4 times a week, be sure it is between 110/60 and 140/85. If it is consistently higher or lower, let me know Schedule a fasting physical exam in 4 weeks

## 2012-03-14 NOTE — Assessment & Plan Note (Signed)
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 Wonders about disability. I recommended first to contact a lawyer if he is going to start the process. He will need a full PT/OT eval at some point

## 2012-03-15 ENCOUNTER — Encounter: Payer: Self-pay | Admitting: Internal Medicine

## 2012-06-21 ENCOUNTER — Ambulatory Visit: Payer: Self-pay | Admitting: Internal Medicine

## 2012-06-21 DIAGNOSIS — Z0289 Encounter for other administrative examinations: Secondary | ICD-10-CM

## 2012-09-20 ENCOUNTER — Telehealth: Payer: Self-pay | Admitting: Internal Medicine

## 2012-09-20 NOTE — Telephone Encounter (Signed)
Spoke with pt & he states he has personal questions for Dr. Drue Novel. Pt would also like to know if Dr. Drue Novel has received the papers for disability. Pt states that he does not have insurance at the time but will be getting some type of disability insurance and would like to if Dr. Drue Novel will continue to be his PCP. If so, the pt states he has noticed some blood in his urine & would like to schedule an appointment. Please advise.

## 2012-09-20 NOTE — Telephone Encounter (Signed)
Please schedule an appointment; if he has fever, chills or blood in the urine that is persistent or severe, needs to go to the ER.

## 2012-09-20 NOTE — Telephone Encounter (Signed)
Left detailed msg on pt's vmail.  

## 2012-09-20 NOTE — Telephone Encounter (Signed)
Pt called requesting to speak with Dr. Drue Novel regarding his prosthetic leg. Please call back at [phone]

## 2012-09-20 NOTE — Telephone Encounter (Signed)
lmovm for pt to return call.  

## 2012-09-22 ENCOUNTER — Telehealth: Payer: Self-pay | Admitting: Internal Medicine

## 2012-09-22 ENCOUNTER — Ambulatory Visit (INDEPENDENT_AMBULATORY_CARE_PROVIDER_SITE_OTHER): Payer: Self-pay | Admitting: Internal Medicine

## 2012-09-22 VITALS — BP 150/90 | HR 78 | Temp 97.7°F | Wt 260.0 lb

## 2012-09-22 DIAGNOSIS — S88119A Complete traumatic amputation at level between knee and ankle, unspecified lower leg, initial encounter: Secondary | ICD-10-CM

## 2012-09-22 DIAGNOSIS — R310 Gross hematuria: Secondary | ICD-10-CM

## 2012-09-22 DIAGNOSIS — I10 Essential (primary) hypertension: Secondary | ICD-10-CM

## 2012-09-22 LAB — POCT URINALYSIS DIPSTICK
Blood, UA: NEGATIVE
Glucose, UA: NEGATIVE
Nitrite, UA: NEGATIVE
Urobilinogen, UA: 0.2
pH, UA: 5

## 2012-09-22 MED ORDER — FELODIPINE ER 5 MG PO TB24: 10.0000 mg | ORAL_TABLET | Freq: Every day | ORAL | Status: DC

## 2012-09-22 NOTE — Assessment & Plan Note (Signed)
2 episodes of painless, gross hematuria this week, no other symptoms. Udip (-). Will check a urine culture, if culture negative will need further eval.

## 2012-09-22 NOTE — Telephone Encounter (Signed)
Patient returning a call, had a message that he needed to see Dr. Drue Novel but wasn't given a time today.  Per EPIC review, he had called on Wedneday with blood in his urine.  Scheduled for 1345 today with Dr. Drue Novel.

## 2012-09-22 NOTE — Assessment & Plan Note (Signed)
BP today slightly elevated, BP at home 140-150/85 Plan: Increase Plendil to 2 tablets a day. Strongly encouraged to come back for a physical within a month.

## 2012-09-22 NOTE — Patient Instructions (Addendum)
Increase plendil to 2 tablets daily. Check the  blood pressure 2 or 3 times a week, be sure it is between 110/60 and 140/80. If it is consistently higher or lower, let me know. You are overdue for a physical exam. Please schedule that within a month

## 2012-09-22 NOTE — Assessment & Plan Note (Signed)
To have a disability eval next week.

## 2012-09-22 NOTE — Progress Notes (Signed)
  Subjective:    Patient ID: Russell Garcia, male    DOB: 1956-03-06, 56 y.o.   MRN: [id number]  HPI Acute visit 2 days ago saw blood in the urine twice, since then the urine color is normal. No history of kidney stones but reports that he did have hematuria "sometimes" when he was a boy, unclear if he had a workup Also, he will have a disability eval next week related to the below knee amputation He still has some skin issues and make very difficult to use the prosthesis. BP noted to be elevated, see a/p   Past Medical History  Diagnosis Date  . Hypertension 2009  . 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 R bka   . Inguinal hernia repair    Past Surgical History  Procedure Date  . R bka   . Inguinal hernia repair      Review of Systems Denies fever or chills No nausea, vomiting, diarrhea. No abdominal pain, flank pain. No difficulty urinating.     Objective:   Physical Exam General -- alert, well-developed, and well-nourished.   Lungs -- normal respiratory effort, no intercostal retractions, no accessory muscle use, and normal breath sounds.   Heart-- normal rate, regular rhythm, no murmur, and no gallop.   Abdomen--soft, non-tender, no distention, no masses, no HSM, no guarding, and no rigidity.  No CVA tenderness Extremities-- stump with some chronic skin changes, at the tip. Some skin irritation throughout. Rectal-- No external abnormalities noted. Normal sphincter tone. No rectal masses or tenderness. Brown stool Prostate:  Prostate gland firm and smooth, no enlargement, nodularity, tenderness, mass, asymmetry or induration. Psych-- Cognition and judgment appear intact. Alert and cooperative with normal attention span and concentration.  not anxious appearing and not depressed appearing.       Assessment & Plan:

## 2012-09-24 ENCOUNTER — Encounter: Payer: Self-pay | Admitting: Internal Medicine

## 2012-09-24 LAB — URINE CULTURE: Organism ID, Bacteria: NO GROWTH

## 2012-09-28 NOTE — Addendum Note (Signed)
Addended by: Edwena Felty T on: 09/28/2012 03:25 PM   Modules accepted: Orders

## 2012-11-24 ENCOUNTER — Encounter: Payer: Self-pay | Admitting: Internal Medicine

## 2012-12-04 ENCOUNTER — Telehealth: Payer: Self-pay | Admitting: Internal Medicine

## 2012-12-04 ENCOUNTER — Encounter: Payer: Self-pay | Admitting: Internal Medicine

## 2012-12-04 NOTE — Telephone Encounter (Signed)
In reference to Urology referral entered on 09/28/12, patient was contacted to schedule an appointment with [facility] and did not, stated he would call them back in January-2014 to do so.  I have since left messages for patient, and mailed him a letter.  Patient will not respond.

## 2012-12-04 NOTE — Telephone Encounter (Signed)
I will send another letter, thx

## 2014-11-22 DIAGNOSIS — Z1211 Encounter for screening for malignant neoplasm of colon: Secondary | ICD-10-CM | POA: Diagnosis not present

## 2014-11-22 DIAGNOSIS — D123 Benign neoplasm of transverse colon: Secondary | ICD-10-CM | POA: Diagnosis not present

## 2014-11-22 DIAGNOSIS — K648 Other hemorrhoids: Secondary | ICD-10-CM | POA: Diagnosis not present

## 2014-11-22 DIAGNOSIS — K635 Polyp of colon: Secondary | ICD-10-CM | POA: Diagnosis not present

## 2014-11-22 LAB — HM COLONOSCOPY

## 2015-01-12 DIAGNOSIS — Y999 Unspecified external cause status: Secondary | ICD-10-CM | POA: Diagnosis not present

## 2015-01-12 DIAGNOSIS — S20362A Insect bite (nonvenomous) of left front wall of thorax, initial encounter: Secondary | ICD-10-CM | POA: Diagnosis not present

## 2015-01-12 DIAGNOSIS — S80862A Insect bite (nonvenomous), left lower leg, initial encounter: Secondary | ICD-10-CM | POA: Diagnosis not present

## 2015-01-12 DIAGNOSIS — S1096XA Insect bite of unspecified part of neck, initial encounter: Secondary | ICD-10-CM | POA: Diagnosis not present

## 2015-01-12 DIAGNOSIS — I10 Essential (primary) hypertension: Secondary | ICD-10-CM | POA: Diagnosis not present

## 2016-04-07 DIAGNOSIS — L03317 Cellulitis of buttock: Secondary | ICD-10-CM | POA: Insufficient documentation

## 2016-04-07 DIAGNOSIS — Z7982 Long term (current) use of aspirin: Secondary | ICD-10-CM | POA: Insufficient documentation

## 2016-04-07 DIAGNOSIS — I10 Essential (primary) hypertension: Secondary | ICD-10-CM | POA: Insufficient documentation

## 2016-04-07 DIAGNOSIS — E785 Hyperlipidemia, unspecified: Secondary | ICD-10-CM | POA: Insufficient documentation

## 2016-04-07 DIAGNOSIS — Z79899 Other long term (current) drug therapy: Secondary | ICD-10-CM | POA: Insufficient documentation

## 2016-04-07 NOTE — ED Notes (Signed)
Pt in with co abscess to left buttocks since yest, has drained some.

## 2016-04-08 ENCOUNTER — Emergency Department
Admission: EM | Admit: 2016-04-08 | Discharge: 2016-04-08 | Disposition: A | Discharge status: Home / Self Care | Payer: Medicare Other | Attending: Emergency Medicine | Admitting: Emergency Medicine

## 2016-04-08 DIAGNOSIS — L03317 Cellulitis of buttock: Secondary | ICD-10-CM

## 2016-04-08 MED ORDER — CEPHALEXIN 500 MG PO CAPS: 500.0000 mg | ORAL_CAPSULE | Freq: Four times a day (QID) | ORAL | Status: AC

## 2016-04-08 MED ORDER — SULFAMETHOXAZOLE-TRIMETHOPRIM 800-160 MG PO TABS
2.0000 | ORAL_TABLET | Freq: Once | ORAL | Status: AC
  Administered 2016-04-08: 2 via ORAL
  Filled 2016-04-08: qty 2

## 2016-04-08 MED ORDER — CEPHALEXIN 500 MG PO CAPS
500.0000 mg | ORAL_CAPSULE | Freq: Once | ORAL | Status: AC
  Administered 2016-04-08: 500 mg via ORAL
  Filled 2016-04-08: qty 1

## 2016-04-08 MED ORDER — SULFAMETHOXAZOLE-TRIMETHOPRIM 800-160 MG PO TABS: 2.0000 | ORAL_TABLET | Freq: Two times a day (BID) | ORAL | Status: DC

## 2016-04-08 NOTE — ED Provider Notes (Signed)
[facility] Emergency Department Provider Note   ____________________________________________  Time seen: Approximately 1:30 AM  I have reviewed the triage vital signs and the nursing notes.   HISTORY  Chief Complaint Abscess   HPI Russell Garcia is a 60 y.o. male with a history of hypertension who is presenting to the emergency department today with a left buttock abscess. He says there has been a small amount of drainage of pus and blood. He says that the skin around the areas hardened. He said he is also had itching to the area and thinks that something bit him there. He says that he actually felt the bite at the time but did not see the bug. Denies any fever. Denies having diabetes.   Past Medical History  Diagnosis Date  . Hypertension 2009  . 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 Gross hematuria 09/22/2012  . General medical examination 03/09/2011  . Dermatitis 03/09/2011  . Hyperlipidemia 03/09/2011  . NUMBNESS 05/16/2010  . 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 HYPERTENSION 07/31/2008  . OBESITY NOS 08/22/2007  . HERNIA, HX OF 08/16/2007  . AMPUTATION, BELOW KNEE, RIGHT, HX OF 08/16/2007    Past Surgical History  Procedure Laterality Date  . R bka    . Inguinal hernia repair      Current Outpatient Rx  Name  Route  Sig  Dispense  Refill  . aspirin 81 MG tablet   Oral   Take 81 mg by mouth every other day.         . felodipine (PLENDIL) 5 MG 24 hr tablet   Oral   Take 2 tablets (10 mg total) by mouth daily.   60 tablet   1   . Omega-3 Fatty Acids (FISH OIL) 500 MG CAPS   Oral   Take 1 capsule by mouth daily.             Allergies Review of patient's allergies indicates no known allergies.  Family History  Problem Relation Age of Onset  . Leukemia Father   . Heart attack Mother 61  . Diabetes Neg Hx   . Colon cancer Neg Hx    . Prostate cancer Neg Hx     Social History Social History  Substance Use Topics  . Smoking status: Never Smoker   . Smokeless tobacco: Never Used  . Alcohol Use: No    Review of Systems Constitutional: No fever/chills Eyes: No visual changes. ENT: No sore throat. Cardiovascular: Denies chest pain. Respiratory: Denies shortness of breath. Gastrointestinal: No abdominal pain.  No nausea, no vomiting.  No diarrhea.  No constipation. Genitourinary: Negative for dysuria. Musculoskeletal: Negative for back pain. Skin: As above Neurological: Negative for headaches, focal weakness or numbness.  10-point ROS otherwise negative.  ____________________________________________   PHYSICAL EXAM:  VITAL SIGNS: ED Triage Vitals  Enc Vitals Group     BP 04/07/16 2306 184/121 mmHg     Pulse Rate 04/07/16 2305 69     Resp 04/07/16 2305 18     Temp 04/07/16 2305 97.5 F (36.4 C)     Temp Source 04/07/16 2305 Oral     SpO2 04/07/16 2305 97 %     Weight 04/07/16 2305 265 lb (120.203 kg)     Height 04/07/16 2305 6' (1.829 m)     Head Cir --  Peak Flow --      Pain Score 04/07/16 2306 2     Pain Loc --      Pain Edu? --      Excl. in [place]? --     Constitutional: Alert and oriented. Well appearing and in no acute distress. Eyes: Conjunctivae are normal. PERRL. EOMI. Head: Atraumatic. Nose: No congestion/rhinnorhea. Mouth/Throat: Mucous membranes are moist.   Neck: No stridor.   Cardiovascular: Normal rate, regular rhythm. Grossly normal heart sounds.   Respiratory: Normal respiratory effort.  No retractions. Lungs CTAB. Gastrointestinal: Soft and nontender. No distention.  Musculoskeletal: No lower extremity tenderness nor edema.  No joint effusions. Neurologic:  Normal speech and language. No gross focal neurologic deficits are appreciated. No gait instability. Skin:  Left buttock with medial and superior induration of an area about 4 x 10 cm. No fluctuance. Towards the  superior end of the induration and there is a small head about 2 mm in diameter and round with a very small amount of pus overlying. I tried to express pus and was unsuccessful. Psychiatric: Mood and affect are normal. Speech and behavior are normal.  ____________________________________________   LABS (all labs ordered are listed, but only abnormal results are displayed)  Labs Reviewed - No data to display ____________________________________________  EKG   ____________________________________________  RADIOLOGY   ____________________________________________   PROCEDURES    ____________________________________________   INITIAL IMPRESSION / ASSESSMENT AND PLAN / ED COURSE  Pertinent labs & imaging results that were available during my care of the patient were reviewed by me and considered in my medical decision making (see chart for details).  Patient with cellulitis. We'll give Keflex as well as Bactrim. Given strict return precautions and knows to return if any worsening or concerning symptoms especially in the next 24-48 hours. I do not see a drainable pocket of pus. Examined his only cellulitis at this time without abscess. ____________________________________________   FINAL CLINICAL IMPRESSION(S) / ED DIAGNOSES  Left buttock cellulitis.    NEW MEDICATIONS STARTED DURING THIS VISIT:  New Prescriptions   No medications on file     Note:  This document was prepared using Dragon voice recognition software and may include unintentional dictation errors.    Orbie Pyo, MD 04/08/16 [phone]

## 2016-04-08 NOTE — Discharge Instructions (Signed)

## 2016-05-14 ENCOUNTER — Ambulatory Visit (INDEPENDENT_AMBULATORY_CARE_PROVIDER_SITE_OTHER): Payer: Medicare Other | Admitting: Family Medicine

## 2016-05-14 ENCOUNTER — Ambulatory Visit (INDEPENDENT_AMBULATORY_CARE_PROVIDER_SITE_OTHER): Payer: Medicare Other

## 2016-05-14 ENCOUNTER — Encounter: Payer: Self-pay | Admitting: Family Medicine

## 2016-05-14 ENCOUNTER — Other Ambulatory Visit: Payer: Self-pay | Admitting: Family Medicine

## 2016-05-14 VITALS — BP 196/136 | HR 76 | Ht 72.0 in | Wt 279.0 lb

## 2016-05-14 DIAGNOSIS — M949 Disorder of cartilage, unspecified: Secondary | ICD-10-CM

## 2016-05-14 DIAGNOSIS — F528 Other sexual dysfunction not due to a substance or known physiological condition: Secondary | ICD-10-CM | POA: Diagnosis not present

## 2016-05-14 DIAGNOSIS — M899 Disorder of bone, unspecified: Secondary | ICD-10-CM

## 2016-05-14 DIAGNOSIS — M25551 Pain in right hip: Secondary | ICD-10-CM

## 2016-05-14 DIAGNOSIS — S79911A Unspecified injury of right hip, initial encounter: Secondary | ICD-10-CM | POA: Diagnosis not present

## 2016-05-14 DIAGNOSIS — M5416 Radiculopathy, lumbar region: Secondary | ICD-10-CM | POA: Diagnosis not present

## 2016-05-14 DIAGNOSIS — M1611 Unilateral primary osteoarthritis, right hip: Secondary | ICD-10-CM

## 2016-05-14 DIAGNOSIS — I10 Essential (primary) hypertension: Secondary | ICD-10-CM

## 2016-05-14 DIAGNOSIS — E785 Hyperlipidemia, unspecified: Secondary | ICD-10-CM

## 2016-05-14 DIAGNOSIS — M2578 Osteophyte, vertebrae: Secondary | ICD-10-CM

## 2016-05-14 DIAGNOSIS — R739 Hyperglycemia, unspecified: Secondary | ICD-10-CM

## 2016-05-14 DIAGNOSIS — M545 Low back pain: Secondary | ICD-10-CM | POA: Diagnosis not present

## 2016-05-14 DIAGNOSIS — R209 Unspecified disturbances of skin sensation: Secondary | ICD-10-CM

## 2016-05-14 MED ORDER — LISINOPRIL-HYDROCHLOROTHIAZIDE 10-12.5 MG PO TABS: 1.0000 | ORAL_TABLET | Freq: Every day | ORAL | Status: DC

## 2016-05-14 NOTE — Progress Notes (Signed)
Russell Garcia is a 60 y.o. male who presents to [name] today for establish care, discuss right hip pain, left leg radiating pain, and hypertension.  Patient has a long history of hypertension. In the past she's been treated with calcium channel blockers that he did not tolerate because of worsening erectile dysfunction. He is asymptomatic with his hypertension would be chest pains palpitations or shortness of breath. He feels well.  He does note however right hip pain for the last 3 months. He denies any injury. He notes the pain is primarily located in the groin and is worse with hip motion and activity. He denies any radiating pain. 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 He's never had much problems with his hips until recently.  Additionally he notes left lumbar radiculopathy. He notes a pain and numbness radiating down the posterior leg to the lateral calf and foot. He denies any injury weakness or bowel bladder dysfunction. No fevers or chills.   Past Medical History  Diagnosis Date  . Hypertension 2009  . 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 R bka    . Inguinal hernia repair     Social History  Substance Use Topics  . Smoking status: Never Smoker   . Smokeless tobacco: Never Used  . Alcohol Use: No   family history includes Heart attack (age of onset: 62) in his mother; Leukemia in his father. There is no history of Diabetes, Colon cancer, or Prostate cancer.  ROS as above: No headache, visual changes, nausea, vomiting, diarrhea, constipation, dizziness, abdominal pain, skin rash, fevers, chills, night sweats, weight loss, swollen lymph nodes, body aches, joint swelling, muscle aches, chest pain, shortness of breath, mood  changes, visual or auditory hallucinations.    Medications: Current Outpatient Prescriptions  Medication Sig Dispense Refill  . Aspirin-Salicylamide-Caffeine (ARTHRITIS STRENGTH BC POWDER PO) Take by mouth.    [name] aspirin 81 MG tablet Take 81 mg by mouth every other day. Reported on 05/14/2016    . lisinopril-hydrochlorothiazide (PRINZIDE,ZESTORETIC) 10-12.5 MG tablet Take 1 tablet by mouth daily. 30 tablet 1   No current facility-administered medications for this visit.   No Known Allergies   Exam:  BP 196/136 mmHg  Pulse 76  Ht 6' (1.829 m)  Wt 279 lb (126.554 kg)  BMI 37.83 kg/m2 Gen: Well NAD HEENT: EOMI,  MMM Lungs: Normal work of breathing. CTABL Heart: RRR no MRG Abd: NABS, Soft. Nondistended, Nontender Exts: Brisk capillary refill, warm and well perfused Left Right BKA  MSK: Right hip normal-appearing nontender. Slight decreased range of motion in flexion and internal and external rotation limited by pain Strength is intact   L-spine: Nontender Normal motion. Normal strength Abnormal gait   Procedure: Real-time Ultrasound Guided Injection of right hip  Device: GE Logiq E  Images permanently stored and available for review in the ultrasound unit. Verbal informed consent obtained. Discussed risks and benefits of procedure. Warned about infection bleeding damage to structures skin hypopigmentation and fat atrophy among others. Patient expresses understanding and agreement Time-out conducted.  Noted no overlying erythema, induration, or other signs of local infection.  Skin prepped in a sterile fashion.  Local anesthesia: Topical Ethyl chloride.  With sterile technique and under real time ultrasound guidance: 80 mg  of Kenalog and 4 mL of Marcaine injected easily.  Completed without difficulty  Pain immediately resolved suggesting accurate placement of the medication.  Advised to call if fevers/chills, erythema, induration, drainage, or persistent  bleeding.  Images permanently stored and available for review in the ultrasound unit.  Impression: Technically successful ultrasound guided injection.  X-ray right hip:  Significant DJD present Awaiting formal radiology review  X-ray L-spine:  DJD present normal anatomical alignment. Awaiting formal radiology review  No results found for this or any previous visit (from the past 24 hour(s)). No results found.    Assessment and Plan: 60 y.o. male with   1) hypertension: Not well controlled: Plan to obtain basic fasting labs and start lisinopril/hydrochlorothiazide. Recheck in one month.  2) right hip pain: Almost certainly due to DJD. Injection helped today. If injection does not last very long I suspect hip replacements probably the best option.  3) left lumbar radiculopathy: Very likely due to the left L5 nerve root. Patient has degenerative changes and this is a very bothersome problem for him. He has failed some conservative management including home exercise program. Plan to obtain an MRI to further evaluate the cause of the pain.  4) erectile dysfunction: Check testosterone   Discussed warning signs or symptoms. Please see discharge instructions. Patient expresses understanding.

## 2016-05-14 NOTE — Progress Notes (Signed)
Quick Note:  Significant hip arthritis is presently we discussed. ______

## 2016-05-14 NOTE — Patient Instructions (Addendum)
Thank you for coming in today. Get morning fasting labs soon.  Start the blood pressure medicines.  Return a few days after MRI to discuss results.  Get MRI soon.   Call or go to the ER if you develop a large red swollen joint with extreme pain or oozing puss.  Come back or go to the emergency room if you notice new weakness new numbness problems walking or bowel or bladder problems.  Hydrochlorothiazide, HCTZ; Lisinopril tablets What is this medicine? HYDROCHLOROTHIAZIDE; LISINOPRIL (hye droe klor oh THYE a zide; lyse IN oh pril) is a combination of a diuretic and an ACE inhibitor. It is used to treat high blood pressure. This medicine may be used for other purposes; ask your health care provider or pharmacist if you have questions. What should I tell my health care provider before I take this medicine? They need to know if you have any of these conditions: -bone marrow disease -decreased urine -heart or blood vessel disease -if you are on a special diet like a low salt diet -immune system problems, like lupus -kidney disease -liver disease -previous swelling of the tongue, face, or lips with difficulty breathing, difficulty swallowing, hoarseness, or tightening of the throat -recent heart attack or stroke -an unusual or allergic reaction to lisinopril, hydrochlorothiazide, sulfa drugs, other medicines, insect venom, foods, dyes, or preservatives -pregnant or trying to get pregnant -breast-feeding How should I use this medicine? Take this medicine by mouth with a glass of water. Follow the directions on the prescription label. You can take it with or without food. If it upsets your stomach, take it with food. Take your medicine at regular intervals. Do not take it more often than directed. Do not stop taking except on your doctor's advice. Talk to your pediatrician regarding the use of this medicine in children. Special care may be needed. Overdosage: If you think you have taken too  much of this medicine contact a poison control center or emergency room at once. NOTE: This medicine is only for you. Do not share this medicine with others. What if I miss a dose? If you miss a dose, take it as soon as you can. If it is almost time for your next dose, take only that dose. Do not take double or extra doses. What may interact with this medicine? -barbiturates like phenobarbital -blood pressure medicines -corticosteroids like prednisone -diabetic medications -diuretics, especially triamterene, spironolactone or amiloride -lithium -NSAIDs like ibuprofen -potassium salts or potassium supplements -prescription pain medicines -skeletal muscle relaxants like tubocurarine -some cholesterol lowering medications like cholestyramine or colestipol This list may not describe all possible interactions. Give your health care provider a list of all the medicines, herbs, non-prescription drugs, or dietary supplements you use. Also tell them if you smoke, drink alcohol, or use illegal drugs. Some items may interact with your medicine. What should I watch for while using this medicine? Visit your doctor or health care professional for regular checks on your progress. Check your blood pressure as directed. Ask your doctor or health care professional what your blood pressure should be and when you should contact him or her. Call your doctor or health care professional if you notice an irregular or fast heart beat. You must not get dehydrated. Ask your doctor or health care professional how much fluid you need to drink a day. Check with him or her if you get an attack of severe diarrhea, nausea and vomiting, or if you sweat a lot. The loss of too  much body fluid can make it dangerous for you to take this medicine. Women should inform their doctor if they wish to become pregnant or think they might be pregnant. There is a potential for serious side effects to an unborn child. Talk to your health care  professional or pharmacist for more information. You may get drowsy or dizzy. Do not drive, use machinery, or do anything that needs mental alertness until you know how this drug affects you. Do not stand or sit up quickly, especially if you are an older patient. This reduces the risk of dizzy or fainting spells. Alcohol can make you more drowsy and dizzy. Avoid alcoholic drinks. This medicine may affect your blood sugar level. If you have diabetes, check with your doctor or health care professional before changing the dose of your diabetic medicine. Avoid salt substitutes unless you are told otherwise by your doctor or health care professional. This medicine can make you more sensitive to the sun. Keep out of the sun. If you cannot avoid being in the sun, wear protective clothing and use sunscreen. Do not use sun lamps or tanning beds/booths. Do not treat yourself for coughs, colds, or pain while you are taking this medicine without asking your doctor or health care professional for advice. Some ingredients may increase your blood pressure. What side effects may I notice from receiving this medicine? Side effects that you should report to your doctor or health care professional as soon as possible: -changes in vision -confusion, dizziness, light headedness or fainting spells -decreased amount of urine passed -difficulty breathing or swallowing, hoarseness, or tightening of the throat -eye pain -fast or irregular heart beat, palpitations, or chest pain -muscle cramps -nausea and vomiting -persistent dry cough -redness, blistering, peeling or loosening of the skin, including inside the mouth -stomach pain -swelling of your face, lips, tongue, hands, or feet -unusual rash, bleeding or bruising, or pinpoint red spots on the skin -worsened gout pain -yellowing of the eyes or skin Side effects that usually do not require medical attention (report to your doctor or health care professional if they  continue or are bothersome): -change in sex drive or performance -cough -headache This list may not describe all possible side effects. Call your doctor for medical advice about side effects. You may report side effects to FDA at 1-800-FDA-1088. Where should I keep my medicine? Keep out of the reach of children. Store at room temperature between 20 and 25 degrees C (68 and 77 degrees F). Protect from moisture and excessive light. Keep container tightly closed. Throw away any unused medicine after the expiration date. NOTE: This sheet is a summary. It may not cover all possible information. If you have questions about this medicine, talk to your doctor, pharmacist, or health care provider.    2016, [employer]vier/[employer]. (2010-07-01 13:33:52)

## 2016-05-14 NOTE — Progress Notes (Signed)
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 ______

## 2016-05-31 ENCOUNTER — Ambulatory Visit (INDEPENDENT_AMBULATORY_CARE_PROVIDER_SITE_OTHER): Payer: Medicare Other

## 2016-05-31 DIAGNOSIS — M4807 Spinal stenosis, lumbosacral region: Secondary | ICD-10-CM | POA: Diagnosis not present

## 2016-05-31 DIAGNOSIS — M4806 Spinal stenosis, lumbar region: Secondary | ICD-10-CM | POA: Diagnosis not present

## 2016-05-31 DIAGNOSIS — M5136 Other intervertebral disc degeneration, lumbar region: Secondary | ICD-10-CM

## 2016-05-31 DIAGNOSIS — M5416 Radiculopathy, lumbar region: Secondary | ICD-10-CM

## 2016-06-01 ENCOUNTER — Ambulatory Visit (INDEPENDENT_AMBULATORY_CARE_PROVIDER_SITE_OTHER): Payer: Medicare Other | Admitting: Family Medicine

## 2016-06-01 ENCOUNTER — Encounter: Payer: Self-pay | Admitting: Family Medicine

## 2016-06-01 VITALS — BP 170/110 | HR 65 | Wt 278.0 lb

## 2016-06-01 DIAGNOSIS — E785 Hyperlipidemia, unspecified: Secondary | ICD-10-CM

## 2016-06-01 DIAGNOSIS — M25551 Pain in right hip: Secondary | ICD-10-CM | POA: Diagnosis not present

## 2016-06-01 DIAGNOSIS — I10 Essential (primary) hypertension: Secondary | ICD-10-CM | POA: Diagnosis not present

## 2016-06-01 DIAGNOSIS — M5416 Radiculopathy, lumbar region: Secondary | ICD-10-CM

## 2016-06-01 MED ORDER — LISINOPRIL-HYDROCHLOROTHIAZIDE 20-25 MG PO TABS: 1.0000 | ORAL_TABLET | Freq: Every day | ORAL | 1 refills | Status: DC

## 2016-06-01 NOTE — Patient Instructions (Signed)
Thank you for coming in today. Get fasting labs soon Attend physical therapy and back injections Increased blood pressure medications Return in 1 month.] Call or go to the emergency room if you get worse, have trouble breathing, have chest pains, or palpitations.

## 2016-06-01 NOTE — Progress Notes (Signed)
Russell Garcia is a 60 y.o. male who presents to [facility]: [place] today for follow-up hypertension, right hip pain, lumbar radiculopathy.  Hypertension: Doing well with lisinopril/hydrochlorothiazide 10/12.5. No chest pains palpitations or shortness of breath.  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 He had a diagnostic and therapeutic ultrasound guided interarticular right hip injection. He noted immediate pain relief on the injection lasted a day or 2. His pain has returned and continues to be problematic. The pain is worse with motion and better with rest..  Lumbar radiculopathy: Patient notes ongoing pain radiating from his back to his buttocks to his lateral calf and foot. Symptoms are worse with activity and sometimes better with rest. No weakness or numbness or loss of function. In the interim he has had an MRI which showed degenerative disc disease with neural impingement especially at the left L5 and S1 nerve roots. He notes continued ongoing pain that is bothersome.   Past Medical History:  Diagnosis Date  . 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 Hypertension 2009   Past Surgical History:  Procedure Laterality Date  . INGUINAL HERNIA REPAIR    . R BKA     Social History  Substance Use Topics  . Smoking status: Never Smoker  . Smokeless tobacco: Never Used  . Alcohol use No   family history includes Heart attack (age of onset: 71) in his mother; Leukemia in his father.  ROS as above:  Medications: Current Outpatient Prescriptions  Medication Sig Dispense Refill  . aspirin 81 MG tablet Take 81 mg by mouth every other day. Reported on 05/14/2016    . Aspirin-Salicylamide-Caffeine (ARTHRITIS STRENGTH BC POWDER PO) Take by mouth.    [name] lisinopril-hydrochlorothiazide (PRINZIDE,ZESTORETIC) 20-25 MG  tablet Take 1 tablet by mouth daily. 30 tablet 1   No current facility-administered medications for this visit.    No Known Allergies   Exam:  BP (!) 170/110   Pulse 65   Wt 278 lb (126.1 kg)   BMI 37.70 kg/m  Gen: Well NAD HEENT: EOMI,  MMM Lungs: Normal work of breathing. CTABL Heart: RRR no MRG Abd: NABS, Soft. Nondistended, Nontender Exts: Brisk capillary refill, warm and well perfused. Left lower extremity. Right BKA Right hip: Pain with motion Antalgic gait.  No results found for this or any previous visit (from the past 24 hour(s)). Mr Lumbar Spine Wo Contrast  Result Date: 05/31/2016 CLINICAL DATA:  Low back pain with left leg pain extending down of the foot. Numbness and burning. Symptoms approximately 9 months. EXAM: MRI LUMBAR SPINE WITHOUT CONTRAST TECHNIQUE: Multiplanar, multisequence MR imaging of the lumbar spine was performed. No intravenous contrast was administered. COMPARISON:  05/14/2016 FINDINGS: Segmentation: The lowest lumbar type non-rib-bearing vertebra is labeled as L5. Alignment:  Unremarkable Vertebrae: Nonspecific 6 mm focus of T2 signal hyperintensity in knee left posterior L4 vertebral body near the pedicle, image 14/5, technically nonspecific although statistically likely to be a benign vascular lesion or small atypical hemangioma. Mild disc desiccation at the T12-L1 and L4-5 levels. Congenitally short pedicles in the lumbar spine. Conus medullaris: Extends to the L1 level and appears normal. Paraspinal and other soft tissues: Bilateral renal fluid signal intensity lesions favor cysts. Disc levels: L1-2: Borderline bilateral foraminal narrowing due to the short pedicles. L2-3: Mild central narrowing of the thecal sac along  with mild left and borderline right foraminal stenosis due to short pedicles and mild disc bulge. L3-4: Prominent central narrowing of the thecal sac with moderate left and mild right foraminal stenosis due to short pedicles, disc bulge, and  facet arthropathy. Cross-sectional area of the thecal sac is 0.4 cm^2. Mild displacement of the L3 nerves in the lateral extraforaminal space due to the disc bulge. Small left facet joint effusion. Small synovial cyst from the left facet joint extends posteriorly. L4-5: Prominent central narrowing of the thecal sac with moderate right and mild left foraminal stenosis due to disc bulge, congenitally short pedicles, and facet arthropathy. Cross-sectional area of the thecal sac 0.3 cm^2. L5-S1: Moderate left and mild right foraminal stenosis with mild left subarticular lateral recess stenosis and borderline central narrowing of the thecal sac due to short pedicles, disc bulge, and left greater than right facet arthropathy. IMPRESSION: 1. Lumbar congenitally short pedicles, spondylosis, and degenerative disc disease cause prominent impingement at L3-4 and L4-5; moderate impingement at L5-S1; and mild impingement at L2- 3, as detailed above. 2. 6 mm T2 hyperintense lesion in the left posterior L4 vertebral body, probably a small atypical hemangioma or similar benign vascular lesion, although technically nonspecific. Electronically Signed   By: Van Clines M.D.   On: 05/31/2016 09:12      Assessment and Plan: 60 y.o. male with   1) hypertension: Improved but still not well controlled. Increase lisinopril/hydrochlorothiazide. Obtain fasting labs. Recheck in 1 month  2) right hip pain: DJD. Discussed options. Consider referral to orthopedic surgery for total hip replacement.  3) left lumbar radiculopathy. Plan for epidural steroid injection and physical therapy. Recheck in one month.   Orders Placed This Encounter  Procedures  . DG Epidurography    Order Specific Question:   Reason for Exam (SYMPTOM  OR DIAGNOSIS REQUIRED)    Answer:   Left L5 or S1 nerve root    Order Specific Question:   Preferred imaging location?    Answer:   [id number]  . Ambulatory referral to Physical Therapy     Referral Priority:   Routine    Referral Type:   Physical Medicine    Referral Reason:   Specialty Services Required    Requested Specialty:   Physical Therapy    Number of Visits Requested:   1    Discussed warning signs or symptoms. Please see discharge instructions. Patient expresses understanding.

## 2016-06-15 ENCOUNTER — Ambulatory Visit (INDEPENDENT_AMBULATORY_CARE_PROVIDER_SITE_OTHER): Payer: Medicare Other | Admitting: Family Medicine

## 2016-06-15 VITALS — BP 170/100 | HR 64 | Resp 16 | Wt 278.0 lb

## 2016-06-15 DIAGNOSIS — Z23 Encounter for immunization: Secondary | ICD-10-CM | POA: Diagnosis not present

## 2016-06-15 DIAGNOSIS — M25551 Pain in right hip: Secondary | ICD-10-CM | POA: Diagnosis not present

## 2016-06-15 MED ORDER — METOPROLOL SUCCINATE ER 50 MG PO TB24: 50.0000 mg | ORAL_TABLET | Freq: Every day | ORAL | 1 refills | Status: DC

## 2016-06-15 NOTE — Progress Notes (Signed)
Russell Garcia is a 60 y.o. male who presents to [facility]: [place] today for follow-up right hip pain, and blood pressure.   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 He was seen by first for this or x-ray showed no significant DJD. He received a ultrasound guided interarticular right hip injection which had immediate pain relief which lasted about 2 weeks. He notes the pain has returned and become severe in the right groin. Pain is worse with motion. He has a pertinent orthopedic issue with right BKA.   Hypertension: Patient notes continued hypertension. He denies any chest pain palpitations shortness of breath. No lightheadedness or dizziness. He currently takes lisinopril/hydrochlorothiazide 20/25 mg.    Past Medical History:  Diagnosis Date  . 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 Hypertension 2009   Past Surgical History:  Procedure Laterality Date  . INGUINAL HERNIA REPAIR    . R BKA     Social History  Substance Use Topics  . Smoking status: Never Smoker  . Smokeless tobacco: Never Used  . Alcohol use No   family history includes Heart attack (age of onset: 75) in his mother; Leukemia in his father.  ROS as above:  Medications: Current Outpatient Prescriptions  Medication Sig Dispense Refill  . aspirin 81 MG tablet Take 81 mg by mouth every other day. Reported on 05/14/2016    . Aspirin-Salicylamide-Caffeine (ARTHRITIS STRENGTH BC POWDER PO) Take by mouth.    [name] lisinopril-hydrochlorothiazide (PRINZIDE,ZESTORETIC) 20-25 MG tablet Take 1 tablet by mouth daily. 30 tablet 1  . metoprolol succinate (TOPROL-XL) 50 MG 24 hr tablet Take 1 tablet (50 mg total) by mouth daily. Take with or immediately following a meal. 30 tablet 1   No current facility-administered medications for this visit.    No Known Allergies   Exam:  BP  (!) 170/100   Pulse 64   Resp 16   Wt 278 lb (126.1 kg)   SpO2 99%   BMI 37.70 kg/m   Gen: Well NAD HEENT: EOMI,  MMM Lungs: Normal work of breathing. CTABL Heart: RRR no MRG Abd: NABS, Soft. Nondistended, Nontender Exts: Brisk capillary refill, warm and well perfused Left leg Right hip: Pain with motion including flexion and external and internal rotation  Procedure: Real-time Ultrasound Guided Injection of Right Hip  Device: GE Logiq E  Images permanently stored and available for review in the ultrasound unit. Verbal informed consent obtained. Discussed risks and benefits of procedure. Warned about infection bleeding damage to structures skin hypopigmentation and fat atrophy among others. Patient expresses understanding and agreement Time-out conducted.  Noted no overlying erythema, induration, or other signs of local infection.  Skin prepped in a sterile fashion.  Local anesthesia: Topical Ethyl chloride.  With sterile technique and under real time ultrasound guidance: 80mg  Kenalog and 4 mL of Marcaine injected easily.  Completed without difficulty  Pain immediately resolved suggesting accurate placement of the medication.  Advised to call if fevers/chills, erythema, induration, drainage, or persistent bleeding.  Images permanently stored and available for review in the ultrasound unit.  Impression: Technically successful ultrasound guided injection.  Lot Numbers Kenalog: [id number] Marcaine: [id number]  No results found for this or any previous visit (from the past 24 hour(s)). No results found.    Assessment and Plan: 60 y.o. male with  1) right hip pain: Very likely DJD  related. Refer to orthopedic surgery for evaluation of THR. Repeat Marcaine/Kenalog injection today for pain control until hip replacement can be arranged.  2) hypertension: Continue lisinopril/hydrochlorothiazide. Add metoprolol. Recheck in one month.   Orders Placed This Encounter    Procedures  . Tdap vaccine greater than or equal to 7yo IM  . Flu Vaccine QUAD 36+ mos PF IM (Fluarix & Fluzone Quad PF)  . Ambulatory referral to Orthopedic Surgery    Referral Priority:   Routine    Referral Type:   Surgical    Referral Reason:   Specialty Services Required    Referred to Provider:   Mcarthur Rossetti, MD    Requested Specialty:   Orthopedic Surgery    Number of Visits Requested:   1    Discussed warning signs or symptoms. Please see discharge instructions. Patient expresses understanding.

## 2016-06-15 NOTE — Patient Instructions (Signed)
Thank you for coming in today. Return in 1 month.  Get those fasting labs.  You should hear from [facility] soon.   Call or go to the ER if you develop a large red swollen joint with extreme pain or oozing puss.

## 2016-06-16 ENCOUNTER — Ambulatory Visit (INDEPENDENT_AMBULATORY_CARE_PROVIDER_SITE_OTHER): Payer: Medicare Other | Admitting: Physical Therapy

## 2016-06-16 ENCOUNTER — Encounter: Payer: Self-pay | Admitting: Physical Therapy

## 2016-06-16 DIAGNOSIS — M5442 Lumbago with sciatica, left side: Secondary | ICD-10-CM

## 2016-06-16 DIAGNOSIS — M6281 Muscle weakness (generalized): Secondary | ICD-10-CM

## 2016-06-16 DIAGNOSIS — M25551 Pain in right hip: Secondary | ICD-10-CM

## 2016-06-16 NOTE — Therapy (Signed)
[place], [place], [address] Phone: [phone]   Fax:  [phone]  Physical Therapy Evaluation  Patient Details  Name: Russell Garcia MRN: [id number] Date of Birth: 04-Oct-1956 Referring Provider: Dr Georgina Snell  Encounter Date: 06/16/2016      PT End of Session - 06/16/16 1017    Visit Number 1   Number of Visits 12   Date for PT Re-Evaluation 07/28/16   PT Start Time 1017   PT Stop Time 1129   PT Time Calculation (min) 72 min   Activity Tolerance Patient tolerated treatment well      Past Medical History:  Diagnosis Date  . 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 Hypertension 2009    Past Surgical History:  Procedure Laterality Date  . INGUINAL HERNIA REPAIR    . R BKA      There were no vitals filed for this visit.       Subjective Assessment - 06/16/16 1018    Subjective Pt report he has a bulging disc in his back, he can walk about 5-6 min then the Lt leg goes numb to the foot and he has to sit. 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 Currently works  part time. The back has been an ongoing issue however now the Rt hip is really bothering him, he is seeing an ortho MD to see if he needs a replacement.     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    How long can you sit comfortably? no problems   How long can you stand comfortably? worse than walking   How long can you walk comfortably? 5-6 min   Diagnostic tests MRI - showed some disc issues   Patient Stated Goals relieve some pain before the surgery   Currently in Pain? Yes   Pain Score 2   no back pain at this time.    Pain Location Hip   Pain Orientation Right   Pain Descriptors / Indicators Aching   Pain Type Chronic pain   Aggravating Factors  walking and lifting   Pain Relieving Factors rest,  medication over the counter.             [facility] PT Assessment - 06/16/16 0001      Assessment   Medical Diagnosis lumbar radiculopathy   Referring Provider Dr Georgina Snell   Onset Date/Surgical Date 06/17/15   Prior Therapy not for his back     Precautions   Precautions None     Balance Screen   Has the patient fallen in the past 6 months No   Has the patient had a decrease in activity level because of a fear of falling?  No   Is the patient reluctant to leave their home because of a fear of falling?  No     Home Environment   Living Environment Private residence   Home Access Stairs to enter   [place] One level     Prior Function   Level of Independence Independent   Vocation Part time employment   Vocation Requirements lifting as needed for work   Leisure travel,      Observation/Other Assessments   Focus on Therapeutic Outcomes (FOTO)  62% limited  Posture/Postural Control   Posture/Postural Control Postural limitations   Postural Limitations Increased lumbar lordosis     ROM / Strength   AROM / PROM / Strength Strength;AROM     AROM   AROM Assessment Site Lumbar   Lumbar Flexion WNL   Lumbar Extension decreased 50% per his baseling   Lumbar - Right Side Bend WNL   Lumbar - Left Side Bend WNL  hip pain   Lumbar - Right Rotation WNL   Lumbar - Left Rotation WNL     Strength   Overall Strength Comments bilat LE's WNL   Strength Assessment Site Lumbar   Lumbar Flexion --  TA poor bilat   Lumbar Extension --  multifidi Lt fair, Rt poor     Palpation   Spinal mobility hypomobile in lumbar spine, pain with CPA mobs L3-4 and Lt UPA mobs L3-4   Palpation comment tightness in Lt lumbar paraspinals and upper gluts.      Special Tests    Special Tests --  (-) SLR and slump Lt                    [facility] Adult PT Treatment/Exercise - 06/16/16 0001      Exercises   Exercises Lumbar     Lumbar Exercises: Stretches   Double Knee to Chest Stretch  1 rep   Lower Trunk Rotation --  10 reps     Lumbar Exercises: Supine   Clam 10 reps   Bent Knee Raise 10 reps     Lumbar Exercises: Prone   Other Prone Lumbar Exercises prone on elbows, pelvis press x 10 and pelvic press with hip ext x 10      Modalities   Modalities Electrical Stimulation;Cryotherapy     Cryotherapy   Number Minutes Cryotherapy 15 Minutes   Cryotherapy Location Lumbar Spine;Hip  Rt hip   Type of Cryotherapy Ice pack     Electrical Stimulation   Electrical Stimulation Location Rt hip   Electrical Stimulation Action IFC    Electrical Stimulation Parameters to tolerance   Electrical Stimulation Goals Pain                PT Education - 06/16/16 1104    Education provided Yes   Education Details HEP , TENs machine   Person(s) Educated Patient   Methods Explanation;Demonstration;Handout   Comprehension Returned demonstration;Verbalized understanding             PT Long Term Goals - 06/16/16 1125      PT LONG TERM GOAL #1   Title I with advanced HEP ( 07/28/16)   Time 6   Period Weeks   Status New     PT LONG TERM GOAL #2   Title be able to walk through a store without having pain/symptoms into his Lt LE (07/28/16)    Time 6   Period Weeks   Status New     PT LONG TERM GOAL #3   Title perform core stability exercise with good pelvic stability/alignment ( 07/28/16)    Time 6   Period Weeks   Status New     PT LONG TERM GOAL #4   Title improve FOTO =/< 44% limited, CK level ( 07/28/16)    Time 6   Period Weeks   Status New               Plan - 06/16/16 1121    Clinical Impression Statement 60 yo male presents with c/o  back pain with radiculopathy into Lt LE, he also has severe Rt hip OA - needs a THA and is 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  He has his prosthesis adjusted as needed to [facility] proper length.  He has significant core weakness, extra abdominal girth, hypomobility in the lumbar spine with muscle tightness  and gait abnormalities due to Rt hip pain/issues.    Rehab Potential Good   PT Frequency 2x / week   PT Duration 6 weeks   PT Treatment/Interventions Moist Heat;Traction;Ultrasound;Therapeutic exercise;Dry needling;Taping;Manual techniques;Cryotherapy;Electrical Stimulation;Patient/family education   PT Next Visit Plan core stability, manual work to lumbar spine, possible TDN   Consulted and Agree with Plan of Care Patient      Patient will benefit from skilled therapeutic intervention in order to improve the following deficits and impairments:  Decreased strength, Pain, Hypomobility, Obesity, Increased muscle spasms, Difficulty walking  Visit Diagnosis: Lumbago with sciatica, left side - Plan: PT plan of care cert/re-cert  Pain in right hip - Plan: PT plan of care cert/re-cert  Muscle weakness (generalized) - Plan: PT plan of care cert/re-cert     Problem List Patient Active Problem List   Diagnosis Date Noted  . Right hip pain 05/14/2016  . Lumbar radiculopathy, chronic 05/14/2016  . Gross hematuria 09/22/2012  . Dermatitis 03/09/2011  . Hyperlipidemia 03/09/2011  . NUMBNESS 05/16/2010  . 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 Essential hypertension 07/31/2008  . OBESITY NOS 08/22/2007  . HERNIA, HX OF 08/16/2007  . AMPUTATION, BELOW KNEE, RIGHT, HX OF 08/16/2007    Jeral Pinch PT  06/16/2016, 11:38 AM  [facility] [place], [place], [address] Phone: [phone]   Fax:  [phone]  Name: Russell Garcia MRN: [id number] Date of Birth: 01-28-1956

## 2016-06-16 NOTE — Patient Instructions (Addendum)
Elbow Prop (Extension)    Prop body up on elbows for __60__ seconds. Slowly lower it. Repeat __1__ times. Do _1-2___ sessions per day.  Pelvic Press    Place hands under belly between navel and pubic bone, palms up. Feel pressure on hands. Increase pressure on hands by pressing pelvis down. This is NOT a pelvic tilt. Hold __5_ seconds. Relax. Repeat _10__ times.  Leg Lift: One-Leg    Press pelvis down. Keep knee straight; lengthen and lift one leg (from waist). Do not twist body. Keep other leg down. Hold _1-2__ seconds. Relax. Repeat 10 time. Repeat with other leg. Repeat once a day.   Knee to Chest: Transverse Plane Stability   Bring one knee up, then return. Be sure pelvis does not roll side to side. Keep pelvis still. Lift knee __10_ times each leg. Restabilize pelvis. Repeat with other leg. Do _1-2__ sets, _1__ times per day.  Hip External Rotation With Pillow: Transverse Plane Stability   One knee bent, one leg straight, on pillow. Slowly roll bent knee out. Be sure pelvis does not rotate. Do _10__ times. Restabilize pelvis. Repeat with other leg. Do _1-2__ sets, _1__ times per day.  Trunk: Knees to Chest - can use a strap under knees to help get more pull     Lie on firm, flat surface. Keep head and shoulders flat on surface. Tuck hands behind knees and pull to chest. Hold _30-60___ seconds. Repeat _1-2___ times. Do __1__ sessions per day. CAUTION: Movement should be gentle and slow.   Lower Trunk Rotation Stretch    Keeping back flat and feet together, rotate knees to left side. Hold _3-5___ seconds. Rotate to the other side Repeat __10__ times per set. Do __1__ sets per session. Do __2__ sessions per day. Copyright  VHI. All rights reserved.  [facility] [place], [place] [address]  [phone] (office) [phone] (fax)   TENS UNIT: This is helpful for muscle pain and spasm.    Search and Purchase a TENS 7000 2nd edition at www.tenspros.com. It should be less than $30.     TENS unit instructions: Do not shower or bathe with the unit on Turn the unit off before removing electrodes or batteries If the electrodes lose stickiness add a drop of water to the electrodes after they are disconnected from the unit and place on plastic sheet. If you continued to have difficulty, call the TENS unit company to purchase more electrodes. Do not apply lotion on the skin area prior to use. Make sure the skin is clean and dry as this will help prolong the life of the electrodes. After use, always check skin for unusual red areas, rash or other skin difficulties. If there are any skin problems, does not apply electrodes to the same area. Never remove the electrodes from the unit by pulling the wires. Do not use the TENS unit or electrodes other than as directed. Do not change electrode placement without consultating your therapist or physician. Keep 2 fingers with between each electrode. Wear time ratio is 2:1, on to off times.    For example on for 30 minutes off for 15 minutes and then on for 30 minutes off for 15 minutes

## 2016-06-17 ENCOUNTER — Encounter: Payer: Self-pay | Admitting: Family Medicine

## 2016-06-22 ENCOUNTER — Ambulatory Visit (INDEPENDENT_AMBULATORY_CARE_PROVIDER_SITE_OTHER): Payer: Medicare Other | Admitting: Physical Therapy

## 2016-06-22 DIAGNOSIS — M6281 Muscle weakness (generalized): Secondary | ICD-10-CM

## 2016-06-22 DIAGNOSIS — M5442 Lumbago with sciatica, left side: Secondary | ICD-10-CM | POA: Diagnosis present

## 2016-06-22 DIAGNOSIS — M25551 Pain in right hip: Secondary | ICD-10-CM

## 2016-06-22 NOTE — Therapy (Addendum)
[facility] [place] [address] [place], [place], [address] Phone: [phone]   Fax:  [phone]  Physical Therapy Treatment  Patient Details  Name: Russell Garcia MRN: [id number] Date of Birth: November 30, 1955 Referring Provider: Dr Denyse Amass  Encounter Date: 06/22/2016      PT End of Session - 06/22/16 1204    Visit Number 2   Number of Visits 12   Date for PT Re-Evaluation 07/28/16   PT Start Time 1201   PT Stop Time 1248   PT Time Calculation (min) 47 min   Activity Tolerance Patient tolerated treatment well      Past Medical History:  Diagnosis Date  . 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 Hypertension 2009    Past Surgical History:  Procedure Laterality Date  . INGUINAL HERNIA REPAIR    . R BKA      There were no vitals filed for this visit.      Subjective Assessment - 06/22/16 1202    Subjective Pt worked today so he is in pain, saw ortho MD and they are doing to schedule a Rt THA.    Patient Stated Goals relieve some pain before the surgery   Currently in Pain? Yes   Pain Score 6    Pain Location Back   Pain Orientation Right   Pain Descriptors / Indicators Aching;Burning   Pain Type Chronic pain   Pain Radiating Towards Lt leg   Pain Onset More than a month ago   Pain Frequency Constant   Aggravating Factors  lifting   Pain Relieving Factors rest                         [facility] Adult PT Treatment/Exercise - 06/22/16 0001      Lumbar Exercises: Supine   Bridge 10 reps   Straight Leg Raise 10 reps  each side   Isometric Hip Flexion 10 reps;5 seconds  each side, contralateral & ipsolateral      Lumbar Exercises: Prone   Opposite Arm/Leg Raise Left arm/Right leg;Right arm/Left leg;20 reps   Other Prone Lumbar Exercises POE then 5 reps press ups.    Other Prone Lumbar Exercises 20 reps upper body lifts     Modalities   Modalities Traction     Traction   Type of Traction Lumbar   Min (lbs) 35   Max (lbs) 50   Hold Time 60   Rest Time 20   Time 12                PT Education - 06/22/16 1206    Education provided Yes   Education Details TENS unit   Person(s) Educated Patient   Methods Explanation;Handout   Comprehension Verbalized understanding             PT Long Term Goals - 06/22/16 1210      PT LONG TERM GOAL #1   Title I with advanced HEP ( 07/28/16)   Status On-going     PT LONG TERM GOAL #2   Title be able to walk through a store without having pain/symptoms into his Lt LE (07/28/16)    Status On-going     PT LONG TERM GOAL #3   Title perform core stability exercise with good pelvic stability/alignment ( 07/28/16)    Status On-going     PT LONG TERM GOAL #4   Title  improve FOTO =/< 44% limited, CK level ( 07/28/16)    Status On-going               Plan - 06/22/16 1211    Clinical Impression Statement This is Russell Garcia's second visit, he worked yesterday and it flared up his pain.  Responds well to extension based exercise.    Rehab Potential Good   PT Frequency 2x / week   PT Duration 6 weeks   PT Treatment/Interventions Moist Heat;Traction;Ultrasound;Therapeutic exercise;Dry needling;Taping;Manual techniques;Cryotherapy;Electrical Stimulation;Patient/family education   PT Next Visit Plan assess tolerance to traction - he will be out of town for the next week, driving to [employer] and Agree with Plan of Care Patient      Patient will benefit from skilled therapeutic intervention in order to improve the following deficits and impairments:  Decreased strength, Pain, Hypomobility, Obesity, Increased muscle spasms, Difficulty walking  Visit Diagnosis: Lumbago with sciatica, left side  Pain in right hip  Muscle weakness (generalized)     Problem List Patient Active Problem List   Diagnosis Date Noted  . Right hip pain 05/14/2016  . Lumbar radiculopathy, chronic 05/14/2016  . Gross  hematuria 09/22/2012  . Dermatitis 03/09/2011  . Hyperlipidemia 03/09/2011  . NUMBNESS 05/16/2010  . 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 Essential hypertension 07/31/2008  . OBESITY NOS 08/22/2007  . HERNIA, HX OF 08/16/2007  . AMPUTATION, BELOW KNEE, RIGHT, HX OF 08/16/2007    Jeral Pinch PT  06/22/2016, 1:02 PM  [facility] [place], [place], [address] Phone: [phone]   Fax:  [phone]  Name: Russell Garcia MRN: [id number] Date of Birth: 1956-03-15   PHYSICAL THERAPY DISCHARGE SUMMARY  Visits from Start of Care: 2  Current functional level related to goals / functional outcomes: unknown   Remaining deficits: unknown   Education / Equipment: Initial HEP Plan:                                                    Patient goals were not met. Patient is being discharged due to not returning since the last visit.  ?????    Jeral Pinch, PT 07/26/16 10:33 AM

## 2016-06-22 NOTE — Patient Instructions (Signed)

## 2016-06-23 ENCOUNTER — Encounter: Payer: Medicare Other | Admitting: Physical Therapy

## 2016-06-25 ENCOUNTER — Encounter: Payer: Medicare Other | Admitting: Physical Therapy

## 2016-06-29 ENCOUNTER — Encounter: Payer: Medicare Other | Admitting: Physical Therapy

## 2016-07-02 ENCOUNTER — Ambulatory Visit: Payer: Medicare Other | Admitting: Family Medicine

## 2016-07-02 ENCOUNTER — Encounter: Payer: Medicare Other | Admitting: Physical Therapy

## 2016-08-04 ENCOUNTER — Other Ambulatory Visit: Payer: Self-pay | Admitting: Family Medicine

## 2016-08-25 ENCOUNTER — Other Ambulatory Visit: Payer: Self-pay | Admitting: Family Medicine

## 2016-09-14 ENCOUNTER — Other Ambulatory Visit: Payer: Self-pay | Admitting: Family Medicine

## 2016-10-28 ENCOUNTER — Other Ambulatory Visit: Payer: Self-pay | Admitting: *Deleted

## 2016-10-28 MED ORDER — LISINOPRIL-HYDROCHLOROTHIAZIDE 10-12.5 MG PO TABS: ORAL_TABLET | ORAL | 0 refills | Status: DC

## 2016-10-29 ENCOUNTER — Other Ambulatory Visit: Payer: Self-pay | Admitting: *Deleted

## 2016-12-06 ENCOUNTER — Telehealth: Payer: Self-pay | Admitting: Family Medicine

## 2016-12-06 NOTE — Telephone Encounter (Signed)
I called and left a VM for patient to call and schedule a f/u on BP appt with Dr.Corey in order to get BP meds refilled

## 2017-06-01 ENCOUNTER — Telehealth (INDEPENDENT_AMBULATORY_CARE_PROVIDER_SITE_OTHER): Payer: Self-pay

## 2017-06-01 NOTE — Telephone Encounter (Signed)
Patient called stating that he needs a Rx sent to [facility], [place] for liners and sleeve.  Cb# is [phone].  Please advise.  Thank You.

## 2017-06-03 ENCOUNTER — Telehealth (INDEPENDENT_AMBULATORY_CARE_PROVIDER_SITE_OTHER): Payer: Self-pay | Admitting: Orthopedic Surgery

## 2017-06-03 ENCOUNTER — Encounter (INDEPENDENT_AMBULATORY_CARE_PROVIDER_SITE_OTHER): Payer: Self-pay | Admitting: Family

## 2017-06-03 ENCOUNTER — Ambulatory Visit (INDEPENDENT_AMBULATORY_CARE_PROVIDER_SITE_OTHER): Payer: Medicare Other | Admitting: Family

## 2017-06-03 VITALS — Ht 72.0 in | Wt 278.0 lb

## 2017-06-03 DIAGNOSIS — Z89511 Acquired absence of right leg below knee: Secondary | ICD-10-CM | POA: Diagnosis not present

## 2017-06-03 NOTE — Progress Notes (Signed)
Office Visit Note   Patient: Russell Garcia           Date of Birth: 1955/12/25           MRN: [id number] Visit Date: 06/03/2017              Requested by: Gregor Hams, [address] [place], [place] [address] PCP: Gregor Hams, MD  Chief Complaint  Patient presents with  . 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 Currently is in a him right below the knee prosthetic. 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 He was last seen by Dr. Ninfa Linden in 2017. States his prosthetic is 61 years old and has been giving him several problems.  Complaining of clicking and a loose ankle. The socket gets loose and he must use increased apply. Currently is in a liner that has completely broken down there are tears and holes. His socket is cracked and broken down.  The patient is a very active and high demand individual.  Assessment & Plan: Visit Diagnoses:  1. Status post below knee amputation of right lower extremity ([place])     Plan: Provided him with an order to [facility] for new liners and sleeves. Will benefit from a new prosthetic and the long term. I provided an order for this as well.   Follow-Up Instructions: Return if symptoms worsen or fail to improve.   Ortho Exam  Patient is alert, oriented, no adenopathy, well-dressed, normal affect, normal respiratory effort. On examination of the right residual limb this is well consolidated well healed no callus no broken down skin no redness.  Imaging: No results found. No images are attached to the encounter.  Labs: Lab Results  Component Value Date   LABORGA NO GROWTH 09/22/2012    Orders:  No orders of the defined types were placed in this encounter.  No orders of the defined types were placed in this encounter.    Procedures: No  procedures performed  Clinical Data: No additional findings.  ROS:  All other systems negative, except as noted in the HPI. Review of Systems  Objective: Vital Signs: Ht 6' (1.829 m)   Wt 278 lb (126.1 kg)   BMI 37.70 kg/m   Specialty Comments:  No specialty comments available.  PMFS History: Patient Active Problem List   Diagnosis Date Noted  . Right hip pain 05/14/2016  . Lumbar radiculopathy, chronic 05/14/2016  . Gross hematuria 09/22/2012  . Dermatitis 03/09/2011  . Hyperlipidemia 03/09/2011  . NUMBNESS 05/16/2010  . 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 Essential hypertension 07/31/2008  . OBESITY NOS 08/22/2007  . HERNIA, HX OF 08/16/2007  . Status post below knee amputation ([place]) 08/16/2007   Past Medical History:  Diagnosis Date  . 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 Hypertension 2009    Family History  Problem Relation Age of Onset  . Leukemia Father   . Heart attack Mother 53  . Diabetes Neg Hx   . Colon cancer Neg Hx   . Prostate cancer Neg Hx     Past Surgical History:  Procedure Laterality Date  . INGUINAL HERNIA REPAIR    . R BKA  Social History   Occupational History  . painter    Social History Main Topics  . Smoking status: Never Smoker  . Smokeless tobacco: Never Used  . Alcohol use No  . Drug use: No  . Sexual activity: Yes    Partners: Female

## 2017-06-03 NOTE — Telephone Encounter (Signed)
Can you please call pt and make appt? He has not been seen in the office since 11/09/12 by Dr. Sharol Given and 06/21/16 by Dr. Ninfa Linden. For insurance he needs to be seen each year. Please make appt.

## 2017-06-03 NOTE — Telephone Encounter (Signed)
Patient called asking for more prosthetic liners and sleeves. CB # [phone]

## 2017-06-06 DIAGNOSIS — L538 Other specified erythematous conditions: Secondary | ICD-10-CM | POA: Diagnosis not present

## 2017-06-06 DIAGNOSIS — I10 Essential (primary) hypertension: Secondary | ICD-10-CM | POA: Diagnosis not present

## 2017-06-07 NOTE — Telephone Encounter (Signed)
Rx sent this morning to [facility]

## 2017-07-05 ENCOUNTER — Encounter (HOSPITAL_COMMUNITY): Payer: Self-pay

## 2017-07-05 DIAGNOSIS — W57XXXA Bitten or stung by nonvenomous insect and other nonvenomous arthropods, initial encounter: Secondary | ICD-10-CM | POA: Insufficient documentation

## 2017-07-05 DIAGNOSIS — L259 Unspecified contact dermatitis, unspecified cause: Secondary | ICD-10-CM | POA: Diagnosis not present

## 2017-07-05 DIAGNOSIS — I10 Essential (primary) hypertension: Secondary | ICD-10-CM | POA: Diagnosis not present

## 2017-07-05 DIAGNOSIS — R21 Rash and other nonspecific skin eruption: Secondary | ICD-10-CM | POA: Diagnosis present

## 2017-07-05 DIAGNOSIS — Z79899 Other long term (current) drug therapy: Secondary | ICD-10-CM | POA: Insufficient documentation

## 2017-07-05 DIAGNOSIS — Z89519 Acquired absence of unspecified leg below knee: Secondary | ICD-10-CM | POA: Diagnosis not present

## 2017-07-05 DIAGNOSIS — S20369A Insect bite (nonvenomous) of unspecified front wall of thorax, initial encounter: Secondary | ICD-10-CM | POA: Diagnosis not present

## 2017-07-05 NOTE — ED Triage Notes (Signed)
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  Onset 3-4 days ago pt bombed house with bug spray, next day red raised bumps, widespread, itches.  Pt started Benadryl yesterday, last dose 1pm today, not effective.  Pt BP high, reports took BP med yesterday, pt reports he sometimes forgets to take BP.

## 2017-07-06 ENCOUNTER — Emergency Department (HOSPITAL_COMMUNITY)
Admission: EM | Admit: 2017-07-06 | Discharge: 2017-07-06 | Disposition: A | Discharge status: Home / Self Care | Payer: Medicare Other | Attending: Emergency Medicine | Admitting: Emergency Medicine

## 2017-07-06 DIAGNOSIS — L259 Unspecified contact dermatitis, unspecified cause: Secondary | ICD-10-CM

## 2017-07-06 DIAGNOSIS — I10 Essential (primary) hypertension: Secondary | ICD-10-CM

## 2017-07-06 DIAGNOSIS — W57XXXA Bitten or stung by nonvenomous insect and other nonvenomous arthropods, initial encounter: Secondary | ICD-10-CM

## 2017-07-06 LAB — I-STAT CHEM 8, ED
BUN: 19 mg/dL (ref 6–20)
CHLORIDE: 103 mmol/L (ref 101–111)
CREATININE: 1.5 mg/dL — AB (ref 0.61–1.24)
Calcium, Ion: 1.23 mmol/L (ref 1.15–1.40)
GLUCOSE: 104 mg/dL — AB (ref 65–99)
HEMATOCRIT: 41 % (ref 39.0–52.0)
HEMOGLOBIN: 13.9 g/dL (ref 13.0–17.0)
POTASSIUM: 3.7 mmol/L (ref 3.5–5.1)
Sodium: 142 mmol/L (ref 135–145)
TCO2: 25 mmol/L (ref 22–32)

## 2017-07-06 MED ORDER — CLONIDINE HCL 0.2 MG PO TABS: 0.2000 mg | ORAL_TABLET | Freq: Two times a day (BID) | ORAL | 0 refills | Status: DC

## 2017-07-06 MED ORDER — CLONIDINE HCL 0.2 MG PO TABS
0.2000 mg | ORAL_TABLET | Freq: Once | ORAL | Status: AC
  Administered 2017-07-06: 0.2 mg via ORAL
  Filled 2017-07-06: qty 1

## 2017-07-06 MED ORDER — METHYLPREDNISOLONE 4 MG PO TBPK: ORAL_TABLET | ORAL | 0 refills | Status: DC

## 2017-07-06 MED ORDER — AMLODIPINE BESYLATE 5 MG PO TABS
5.0000 mg | ORAL_TABLET | Freq: Once | ORAL | Status: AC
  Administered 2017-07-06: 5 mg via ORAL
  Filled 2017-07-06: qty 1

## 2017-07-06 MED ORDER — HYDROXYZINE HCL 25 MG PO TABS: 25.0000 mg | ORAL_TABLET | Freq: Four times a day (QID) | ORAL | 0 refills | Status: DC

## 2017-07-06 MED ORDER — HYDROXYZINE HCL 25 MG PO TABS: 25.0000 mg | ORAL_TABLET | Freq: Four times a day (QID) | ORAL | 0 refills | Status: DC | PRN

## 2017-07-06 MED ORDER — PREDNISONE 20 MG PO TABS
60.0000 mg | ORAL_TABLET | Freq: Once | ORAL | Status: AC
  Administered 2017-07-06: 60 mg via ORAL
  Filled 2017-07-06: qty 3

## 2017-07-06 NOTE — ED Provider Notes (Signed)
[facility] DEPT Provider Note   CSN: [id number] Arrival date & time: 07/05/17  1944     History   Chief Complaint Chief Complaint  Patient presents with  . Rash    HPI Russell Garcia is a 61 y.o. male.  Patient presents to the emergency department for evaluation of rash. Patient reports that he did some work in a house that had many roaches and bedbugs. He set off a bug bomb in the house and they went back the next day to do work in the house. He was in the house for some time. The next day started having itching all over his body. He has not had any tongue swelling, throat swelling, difficulty breathing.      Past Medical History:  Diagnosis Date  . 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 Hypertension 2009    Patient Active Problem List   Diagnosis Date Noted  . Right hip pain 05/14/2016  . Lumbar radiculopathy, chronic 05/14/2016  . Gross hematuria 09/22/2012  . Dermatitis 03/09/2011  . Hyperlipidemia 03/09/2011  . NUMBNESS 05/16/2010  . 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 Essential hypertension 07/31/2008  . OBESITY NOS 08/22/2007  . HERNIA, HX OF 08/16/2007  . 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 INGUINAL HERNIA REPAIR    . R BKA         Home Medications    Prior to Admission medications   Medication Sig Start Date End Date Taking? Authorizing Provider  lisinopril-hydrochlorothiazide (PRINZIDE,ZESTORETIC) 10-12.5 MG tablet TAKE 1 TABLET BY MOUTH DAILY. 08/04/16  Yes Gregor Hams, MD  cloNIDine (CATAPRES) 0.2 MG tablet Take 1 tablet (0.2 mg total) by mouth 2 (two) times daily. 07/06/17   Orpah Greek, MD  hydrOXYzine (ATARAX/VISTARIL) 25 MG tablet Take 1 tablet (25 mg total) by mouth every 6 (six) hours. 07/06/17   Orpah Greek, MD  lisinopril-hydrochlorothiazide (PRINZIDE,ZESTORETIC) 20-25 MG tablet Take 1 tablet by mouth  daily. Patient not taking: Reported on 07/06/2017 06/01/16   Gregor Hams, MD  methylPREDNISolone (MEDROL DOSEPAK) 4 MG TBPK tablet As directed 07/06/17   Orpah Greek, MD  metoprolol succinate (TOPROL-XL) 50 MG 24 hr tablet Take 1 tablet (50 mg total) by mouth daily. Take with or immediately following a meal. Patient not taking: Reported on 07/06/2017 06/15/16   Gregor Hams, MD    Family History Family History  Problem Relation Age of Onset  . Leukemia Father   . Heart attack Mother 50  . Diabetes Neg Hx   . Colon cancer Neg Hx   . Prostate cancer Neg Hx     Social History Social History  Substance Use Topics  . Smoking status: Never Smoker  . Smokeless tobacco: Never Used  . Alcohol use No     Allergies   Patient has no known allergies.   Review of Systems Review of Systems  Skin: Positive for rash.  All other systems reviewed and are negative.    Physical Exam Updated Vital Signs BP (!) 180/98   Pulse 82   Temp 97.9 F (36.6 C) (Oral)   Resp 18   Ht 6' (1.829 m)   Wt 122.5 kg (270 lb)   SpO2 (!) 85%   BMI 36.62 kg/m   Physical Exam  Constitutional: He is oriented to person, place, and time. He appears well-developed and  well-nourished. No distress.  HENT:  Head: Normocephalic and atraumatic.  Right Ear: Hearing normal.  Left Ear: Hearing normal.  Nose: Nose normal.  Mouth/Throat: Oropharynx is clear and moist and mucous membranes are normal.  Eyes: Pupils are equal, round, and reactive to light. Conjunctivae and EOM are normal.  Neck: Normal range of motion. Neck supple.  Cardiovascular: Regular rhythm, S1 normal and S2 normal.  Exam reveals no gallop and no friction rub.   No murmur heard. Pulmonary/Chest: Effort normal and breath sounds normal. No respiratory distress. He exhibits no tenderness.  Abdominal: Soft. Normal appearance and bowel sounds are normal. There is no hepatosplenomegaly. There is no tenderness. There is no rebound, no  guarding, no tenderness at McBurney's point and negative Murphy's sign. No hernia.  Musculoskeletal: Normal range of motion.  Neurological: He is alert and oriented to person, place, and time. He has normal strength. No cranial nerve deficit or sensory deficit. Coordination normal. GCS eye subscore is 4. GCS verbal subscore is 5. GCS motor subscore is 6.  Skin: Skin is warm, dry and intact. Rash noted. No cyanosis.  Numerous raised, slightly erythematous lesions on extremities with excoriations  Raised, erythematous, confluent patches on chest and back  Psychiatric: He has a normal mood and affect. His speech is normal and behavior is normal. Thought content normal.  Nursing note and vitals reviewed.    ED Treatments / Results  Labs (all labs ordered are listed, but only abnormal results are displayed) Labs Reviewed  I-STAT CHEM 8, ED - Abnormal; Notable for the following:       Result Value   Creatinine, Ser 1.50 (*)    Glucose, Bld 104 (*)    All other components within normal limits    EKG  EKG Interpretation None       Radiology No results found.  Procedures Procedures (including critical care time)  Medications Ordered in ED Medications  predniSONE (DELTASONE) tablet 60 mg (not administered)  amLODipine (NORVASC) tablet 5 mg (5 mg Oral Given 07/06/17 0327)  cloNIDine (CATAPRES) tablet 0.2 mg (0.2 mg Oral Given 07/06/17 0523)     Initial Impression / Assessment and Plan / ED Course  I have reviewed the triage vital signs and the nursing notes.  Pertinent labs & imaging results that were available during my care of the patient were reviewed by me and considered in my medical decision making (see chart for details).     Patient with rash on arms that looks very consistent with bedbug bites. These areas are very itchy, no sign of infection. He also, however, has a raised, patchy rash on his chest and back does not look consistent with dates. Cannot rule out allergic  reaction to the chemical that he used the other day, but no difficulty breathing, tongue swelling. Patient is very hypertensive today. He reports that he takes lisinopril. He has not had any recent changes in his medications. No headache, blurred vision, chest pain, shortness of breath.  Baseline blood work performed. He does have a creatinine 1.5. I do not have any previous labs since 2012, at which time his creatinine was 1.1. I suspect that he has been hypertensive for some time. He was administered Norvasc and clonidine in the ER with only some improvement. Suspect that he will require multiple medication adjustments to gain good blood pressure control. Will prescribe clonidine to be used as an outpatient until he can follow-up with primary care doctor.  Final Clinical Impressions(s) / ED Diagnoses  Final diagnoses:  Contact dermatitis, unspecified contact dermatitis type, unspecified trigger  Bedbug bite, initial encounter  Essential hypertension    New Prescriptions New Prescriptions   CLONIDINE (CATAPRES) 0.2 MG TABLET    Take 1 tablet (0.2 mg total) by mouth 2 (two) times daily.   HYDROXYZINE (ATARAX/VISTARIL) 25 MG TABLET    Take 1 tablet (25 mg total) by mouth every 6 (six) hours.   METHYLPREDNISOLONE (MEDROL DOSEPAK) 4 MG TBPK TABLET    As directed     Orpah Greek, MD 07/06/17 [phone]

## 2018-11-01 DIAGNOSIS — R69 Illness, unspecified: Secondary | ICD-10-CM | POA: Diagnosis not present

## 2018-12-12 DIAGNOSIS — I10 Essential (primary) hypertension: Secondary | ICD-10-CM | POA: Diagnosis not present

## 2018-12-12 DIAGNOSIS — I160 Hypertensive urgency: Secondary | ICD-10-CM | POA: Diagnosis not present

## 2018-12-12 DIAGNOSIS — Z125 Encounter for screening for malignant neoplasm of prostate: Secondary | ICD-10-CM | POA: Diagnosis not present

## 2018-12-12 DIAGNOSIS — G4733 Obstructive sleep apnea (adult) (pediatric): Secondary | ICD-10-CM | POA: Diagnosis not present

## 2018-12-12 DIAGNOSIS — Z131 Encounter for screening for diabetes mellitus: Secondary | ICD-10-CM | POA: Diagnosis not present

## 2018-12-12 DIAGNOSIS — M25551 Pain in right hip: Secondary | ICD-10-CM | POA: Diagnosis not present

## 2018-12-12 DIAGNOSIS — R351 Nocturia: Secondary | ICD-10-CM | POA: Diagnosis not present

## 2018-12-12 DIAGNOSIS — Z136 Encounter for screening for cardiovascular disorders: Secondary | ICD-10-CM | POA: Diagnosis not present

## 2018-12-13 ENCOUNTER — Other Ambulatory Visit: Payer: Self-pay | Admitting: Internal Medicine

## 2018-12-13 ENCOUNTER — Ambulatory Visit
Admission: RE | Admit: 2018-12-13 | Discharge: 2018-12-13 | Disposition: A | Discharge status: Home / Self Care | Payer: Medicare HMO | Source: Ambulatory Visit | Attending: Internal Medicine | Admitting: Internal Medicine

## 2018-12-13 DIAGNOSIS — M25551 Pain in right hip: Secondary | ICD-10-CM

## 2018-12-13 DIAGNOSIS — M1611 Unilateral primary osteoarthritis, right hip: Secondary | ICD-10-CM | POA: Diagnosis not present

## 2018-12-15 DIAGNOSIS — R69 Illness, unspecified: Secondary | ICD-10-CM | POA: Diagnosis not present

## 2018-12-19 ENCOUNTER — Encounter (INDEPENDENT_AMBULATORY_CARE_PROVIDER_SITE_OTHER): Payer: Self-pay | Admitting: Orthopedic Surgery

## 2018-12-19 ENCOUNTER — Ambulatory Visit (INDEPENDENT_AMBULATORY_CARE_PROVIDER_SITE_OTHER): Payer: Medicare Other | Admitting: Orthopedic Surgery

## 2018-12-19 VITALS — Ht 72.0 in | Wt 270.0 lb

## 2018-12-19 DIAGNOSIS — M1611 Unilateral primary osteoarthritis, right hip: Secondary | ICD-10-CM

## 2018-12-19 DIAGNOSIS — R351 Nocturia: Secondary | ICD-10-CM | POA: Diagnosis not present

## 2018-12-19 DIAGNOSIS — G4733 Obstructive sleep apnea (adult) (pediatric): Secondary | ICD-10-CM | POA: Diagnosis not present

## 2018-12-19 DIAGNOSIS — M25551 Pain in right hip: Secondary | ICD-10-CM | POA: Diagnosis not present

## 2018-12-19 DIAGNOSIS — Z89511 Acquired absence of right leg below knee: Secondary | ICD-10-CM

## 2018-12-19 DIAGNOSIS — I160 Hypertensive urgency: Secondary | ICD-10-CM | POA: Diagnosis not present

## 2018-12-19 DIAGNOSIS — I10 Essential (primary) hypertension: Secondary | ICD-10-CM | POA: Diagnosis not present

## 2018-12-19 NOTE — Progress Notes (Signed)
Office Visit Note   Patient: Russell Garcia           Date of Birth: [birth year]           MRN: [id number] Visit Date: 12/19/2018              Requested by: Gregor Hams, MD [address] [place], [place] [address] PCP: Gregor Hams, MD  Chief Complaint  Patient presents with  . Right Leg - Follow-up    BKA [employer] prosthetic evaluation       HPI: Patient is a 63 year old gentleman who presents for 2 separate issues #1 he has a worn-out prosthesis on the right and needs a new socket new liner new sleeves.  Patient also complains of increasing pain in the right hip he has pain with activities of daily living.  Patient states that his socket is cracked and due to decreased residual volume he is unable to safely ambulate in his current socket.  Assessment & Plan: Visit Diagnoses:  1. Unilateral primary osteoarthritis, right hip   2. S/P below knee amputation, right (HCC)     Plan: Prescription was provided for [employer] for new socket new liner new sleeve.  Discussed with the patient his degenerative arthritic changes of the right hip.  Discussed that he would not be a candidate for an anterior approach since he has a transtibial amputation.  Recommended proceeding with a posterior approach for the total hip arthroplasty discussed that he is at an increased risk of infection neurovascular injury DVT risk for dislocation.  Patient states he understands and wishes to proceed at this time.  Follow-Up Instructions: Return in about 2 weeks (around 01/02/2019).   Ortho Exam  Patient is alert, oriented, no adenopathy, well-dressed, normal affect, normal respiratory effort. Examination patient has an antalgic gait.  He is only about 30 degrees of internal and external rotation of right hip and this reproduces pain.  He has a negative straight leg raise.  Review of the radiographs of the right hip shows destructive arthritic changes with bone-on-bone contact large bony cyst and  shortening of the leg.  Patient's residual leg has a stable transtibial amputation he has no rotational stability with the socket due to loss of residual volume.  He does have an antalgic gait.  Imaging: No results found. No images are attached to the encounter.  Labs: Lab Results  Component Value Date   LABORGA NO GROWTH 09/22/2012     No results found for: ALBUMIN, PREALBUMIN, LABURIC  Body mass index is 36.62 kg/m.  Orders:  No orders of the defined types were placed in this encounter.  No orders of the defined types were placed in this encounter.    Procedures: No procedures performed  Clinical Data: No additional findings.  ROS:  All other systems negative, except as noted in the HPI. Review of Systems  Objective: Vital Signs: Ht 6' (1.829 m)   Wt 270 lb (122.5 kg)   BMI 36.62 kg/m   Specialty Comments:  No specialty comments available.  PMFS History: Patient Active Problem List   Diagnosis Date Noted  . Right hip pain 05/14/2016  . Lumbar radiculopathy, chronic 05/14/2016  . Gross hematuria 09/22/2012  . Dermatitis 03/09/2011  . Hyperlipidemia 03/09/2011  . NUMBNESS 05/16/2010  . 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 Essential hypertension 07/31/2008  . OBESITY NOS 08/22/2007  . HERNIA, HX OF 08/16/2007  . 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 Hypertension 2009    Family History  Problem Relation Age of Onset  . Leukemia Father   . Heart attack Mother 66  . Diabetes Neg Hx   . Colon cancer Neg Hx   . Prostate cancer Neg Hx     Past Surgical History:  Procedure Laterality Date  . INGUINAL HERNIA REPAIR    . R BKA     Social History   Occupational History  . Occupation: Curator  Tobacco Use  . Smoking status: Never Smoker  . Smokeless tobacco: Never Used  Substance and Sexual Activity  . Alcohol  use: No    Alcohol/week: 0.0 standard drinks  . Drug use: No  . Sexual activity: Yes    Partners: Female

## 2018-12-26 ENCOUNTER — Ambulatory Visit (INDEPENDENT_AMBULATORY_CARE_PROVIDER_SITE_OTHER): Payer: Medicare Other | Admitting: Orthopaedic Surgery

## 2018-12-26 ENCOUNTER — Other Ambulatory Visit (HOSPITAL_BASED_OUTPATIENT_CLINIC_OR_DEPARTMENT_OTHER): Payer: Self-pay

## 2018-12-26 DIAGNOSIS — R5383 Other fatigue: Secondary | ICD-10-CM

## 2018-12-26 DIAGNOSIS — G4700 Insomnia, unspecified: Secondary | ICD-10-CM

## 2018-12-26 DIAGNOSIS — R0683 Snoring: Secondary | ICD-10-CM

## 2019-01-08 DIAGNOSIS — R399 Unspecified symptoms and signs involving the genitourinary system: Secondary | ICD-10-CM | POA: Diagnosis not present

## 2019-01-08 DIAGNOSIS — I160 Hypertensive urgency: Secondary | ICD-10-CM | POA: Diagnosis not present

## 2019-01-08 DIAGNOSIS — Z77090 Contact with and (suspected) exposure to asbestos: Secondary | ICD-10-CM | POA: Diagnosis not present

## 2019-01-08 DIAGNOSIS — Z23 Encounter for immunization: Secondary | ICD-10-CM | POA: Diagnosis not present

## 2019-01-08 DIAGNOSIS — M25551 Pain in right hip: Secondary | ICD-10-CM | POA: Diagnosis not present

## 2019-01-08 DIAGNOSIS — J302 Other seasonal allergic rhinitis: Secondary | ICD-10-CM | POA: Diagnosis not present

## 2019-01-08 DIAGNOSIS — Z0001 Encounter for general adult medical examination with abnormal findings: Secondary | ICD-10-CM | POA: Diagnosis not present

## 2019-01-08 DIAGNOSIS — G4733 Obstructive sleep apnea (adult) (pediatric): Secondary | ICD-10-CM | POA: Diagnosis not present

## 2019-01-08 DIAGNOSIS — Z136 Encounter for screening for cardiovascular disorders: Secondary | ICD-10-CM | POA: Diagnosis not present

## 2019-01-08 DIAGNOSIS — I10 Essential (primary) hypertension: Secondary | ICD-10-CM | POA: Diagnosis not present

## 2019-01-15 ENCOUNTER — Encounter (HOSPITAL_BASED_OUTPATIENT_CLINIC_OR_DEPARTMENT_OTHER): Payer: Medicare HMO

## 2019-02-05 DIAGNOSIS — I10 Essential (primary) hypertension: Secondary | ICD-10-CM | POA: Diagnosis not present

## 2019-02-05 DIAGNOSIS — Z23 Encounter for immunization: Secondary | ICD-10-CM | POA: Diagnosis not present

## 2019-02-05 DIAGNOSIS — Z136 Encounter for screening for cardiovascular disorders: Secondary | ICD-10-CM | POA: Diagnosis not present

## 2019-02-05 DIAGNOSIS — Z77090 Contact with and (suspected) exposure to asbestos: Secondary | ICD-10-CM | POA: Diagnosis not present

## 2019-02-05 DIAGNOSIS — J302 Other seasonal allergic rhinitis: Secondary | ICD-10-CM | POA: Diagnosis not present

## 2019-02-05 DIAGNOSIS — I160 Hypertensive urgency: Secondary | ICD-10-CM | POA: Diagnosis not present

## 2019-02-05 DIAGNOSIS — G4733 Obstructive sleep apnea (adult) (pediatric): Secondary | ICD-10-CM | POA: Diagnosis not present

## 2019-02-05 DIAGNOSIS — Z1211 Encounter for screening for malignant neoplasm of colon: Secondary | ICD-10-CM | POA: Diagnosis not present

## 2019-02-05 DIAGNOSIS — R399 Unspecified symptoms and signs involving the genitourinary system: Secondary | ICD-10-CM | POA: Diagnosis not present

## 2019-02-05 DIAGNOSIS — Z131 Encounter for screening for diabetes mellitus: Secondary | ICD-10-CM | POA: Diagnosis not present

## 2019-03-15 ENCOUNTER — Ambulatory Visit: Payer: Medicare HMO | Admitting: Orthopaedic Surgery

## 2019-03-15 ENCOUNTER — Other Ambulatory Visit: Payer: Self-pay

## 2019-03-15 ENCOUNTER — Encounter: Payer: Self-pay | Admitting: Orthopaedic Surgery

## 2019-03-15 VITALS — Ht 73.5 in | Wt 295.2 lb

## 2019-03-15 DIAGNOSIS — M1611 Unilateral primary osteoarthritis, right hip: Secondary | ICD-10-CM | POA: Diagnosis not present

## 2019-03-15 DIAGNOSIS — M25551 Pain in right hip: Secondary | ICD-10-CM

## 2019-03-15 DIAGNOSIS — Z89511 Acquired absence of right leg below knee: Secondary | ICD-10-CM | POA: Diagnosis not present

## 2019-03-15 NOTE — Progress Notes (Signed)
Office Visit Note   Patient: Russell Garcia           Date of Birth: October 16, 1956           MRN: [id number] Visit Date: 03/15/2019              Requested by: Gregor Hams, MD [address] [place], [place] [address] PCP: Gregor Hams, MD   Assessment & Plan: Visit Diagnoses:  1. Unilateral primary osteoarthritis, right hip   2. Pain of right hip joint     Plan: We had a long and thorough discussion about hip replacement surgery.  He would like this done through direct anterior approach.  The only complicating feature is the fact that he does have a below-knee prosthesis on his right side.  It is a well fitted socket prosthesis.  It is one that we can easily put a traction boot on and we feel comfortable with pulling traction through that leg based on what we did in the office.  I had my partner Dr. Sharol Given even pull hard on the leg to see if we could separated and we could not.  With that being said I feel that if we leave the prosthesis on and even secure it more at the time of surgery that we would be able to still have the leverage we need to perform direct anterior hip surgery on the [facility] operative table.  I showed him a hip model explained in detail what the surgery involves.  We had a long and thorough discussion about the risk and benefits as well.  I feel confident and comfortable being able to do this the direct anterior approach with his prosthetic leg in place.  All questions concerns were answered and addressed.  We will work on getting this scheduled.  If for some reason we are not successful on the femur side at the time of surgery we could certainly replace the femoral side we do a second incision through an anterior lateral approach.  We just need to move into a separate operating table for that but hopefully that is an extremely low likelihood of that needing to happen.  Follow-Up Instructions: Return for 2 weeks post-op.   Orders:  No orders of the defined types were  placed in this encounter.  No orders of the defined types were placed in this encounter.     Procedures: No procedures performed   Clinical Data: No additional findings.   Subjective: Chief Complaint  Patient presents with  . Right Hip - Pain  The patient is very well-known to me.  He is a 63 year old gentleman who is been dealing with worsening right hip arthritis for the last 5 years.  We first saw him in 2015 for this.  His x-rays for the last 5 years have shown worsening bone-on-bone wear of the right hip with a normal left hip.  At this point his right hip is essentially shows no joint space remaining.  At this point his pain can be 10 out of 10.  It is detrimentally affect his mobility, his quality of life and his activities daily living.  He has had multiple intra-articular injections over the years of a steroid under fluoroscopy into the right hip.  He is not a diabetic.  His BMI is 38.  At this point given the failure of conservative treatment for over 5 years he does wish to proceed with a total hip arthroplasty if that is possible.  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  He has a well fitting socket based prosthesis on his right lower extremity which is the side that he has the debilitating arthritis in his right hip.  HPI  Review of Systems He currently denies any headache, chest pain, shortness of breath, fever, chills, nausea, vomiting  Objective: Vital Signs: Ht 6' 1.5" (1.867 m)   Wt 295 lb 3.2 oz (133.9 kg)   BMI 38.42 kg/m   Physical Exam He is alert and orient x3 and in no acute distress Ortho Exam Examination of his left hip is normal examination of his right hip shows actually pretty decent rotation but certainly severe pain with internal and external rotation.  There is stiffness with rotation as well. Specialty Comments:  No specialty comments available.  Imaging:  No results found. X-rays from as far back as 2015 to his most recent in February of this year show severe end-stage arthritis of the right hip.  There is complete loss of the joint space.  There are sclerotic changes as well as large particular osteophytes.  There is also cystic changes on both sides of the joint.  PMFS History: Patient Active Problem List   Diagnosis Date Noted  . Unilateral primary osteoarthritis, right hip 03/15/2019  . Right hip pain 05/14/2016  . Lumbar radiculopathy, chronic 05/14/2016  . Gross hematuria 09/22/2012  . Dermatitis 03/09/2011  . Hyperlipidemia 03/09/2011  . NUMBNESS 05/16/2010  . 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 Essential hypertension 07/31/2008  . OBESITY NOS 08/22/2007  . HERNIA, HX OF 08/16/2007  . 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 Hypertension 2009    Family History  Problem Relation Age of Onset  . Leukemia Father   . Heart attack Mother 49  . Diabetes Neg Hx   . Colon cancer Neg Hx   . Prostate cancer Neg Hx     Past Surgical History:  Procedure Laterality Date  . INGUINAL HERNIA REPAIR    . R BKA     Social History   Occupational History  . Occupation: Curator  Tobacco Use  . Smoking status: Never Smoker  . Smokeless tobacco: Never Used  Substance and Sexual Activity  . Alcohol use: No    Alcohol/week: 0.0 standard drinks  . Drug use: No  . Sexual activity: Yes    Partners: Female

## 2019-03-24 ENCOUNTER — Telehealth: Payer: Self-pay | Admitting: *Deleted

## 2019-03-24 DIAGNOSIS — Z20822 Contact with and (suspected) exposure to covid-19: Secondary | ICD-10-CM

## 2019-03-24 NOTE — Telephone Encounter (Signed)
I called pt and let him know that he may have been potentially exposed to an employee who later tested positive for COVID-19.  We are offering free testing. He did want to be tested so I scheduled him for Sunday Mar 25, 2019 at 10:00 at the [facility] location in [place]. I instructed him to stay in the car and wear a mask.  I sent this to the [facility] pool so the agents could put the appt on the schedule.   I placed the order.

## 2019-03-25 ENCOUNTER — Other Ambulatory Visit: Payer: Medicare HMO

## 2019-03-25 DIAGNOSIS — Z20822 Contact with and (suspected) exposure to covid-19: Secondary | ICD-10-CM

## 2019-03-26 LAB — NOVEL CORONAVIRUS, NAA: SARS-CoV-2, NAA: NOT DETECTED

## 2019-04-11 ENCOUNTER — Other Ambulatory Visit: Payer: Self-pay

## 2019-04-12 ENCOUNTER — Other Ambulatory Visit: Payer: Self-pay | Admitting: Physician Assistant

## 2019-04-19 ENCOUNTER — Encounter (HOSPITAL_COMMUNITY): Payer: Self-pay

## 2019-04-19 ENCOUNTER — Encounter (HOSPITAL_COMMUNITY)
Admission: RE | Admit: 2019-04-19 | Discharge: 2019-04-19 | Disposition: A | Discharge status: Home / Self Care | Payer: Medicare HMO | Source: Ambulatory Visit | Attending: Orthopaedic Surgery | Admitting: Orthopaedic Surgery

## 2019-04-19 ENCOUNTER — Other Ambulatory Visit: Payer: Self-pay

## 2019-04-19 DIAGNOSIS — Z01818 Encounter for other preprocedural examination: Secondary | ICD-10-CM | POA: Diagnosis not present

## 2019-04-19 DIAGNOSIS — Z1159 Encounter for screening for other viral diseases: Secondary | ICD-10-CM | POA: Insufficient documentation

## 2019-04-19 HISTORY — DX: Anxiety disorder, unspecified: F41.9

## 2019-04-19 HISTORY — DX: Unspecified osteoarthritis, unspecified site: M19.90

## 2019-04-19 HISTORY — DX: Depression, unspecified: F32.A

## 2019-04-19 LAB — BASIC METABOLIC PANEL
Anion gap: 11 (ref 5–15)
BUN: 20 mg/dL (ref 8–23)
CO2: 25 mmol/L (ref 22–32)
Calcium: 9.7 mg/dL (ref 8.9–10.3)
Chloride: 105 mmol/L (ref 98–111)
Creatinine, Ser: 1.48 mg/dL — ABNORMAL HIGH (ref 0.61–1.24)
GFR calc Af Amer: 58 mL/min — ABNORMAL LOW (ref >60)
GFR calc non Af Amer: 50 mL/min — ABNORMAL LOW (ref >60)
Glucose, Bld: 94 mg/dL (ref 70–99)
Potassium: 3.8 mmol/L (ref 3.5–5.1)
Sodium: 141 mmol/L (ref 135–145)

## 2019-04-19 LAB — CBC
HCT: 42.6 % (ref 39.0–52.0)
Hemoglobin: 13.3 g/dL (ref 13.0–17.0)
MCH: 26.5 pg (ref 26.0–34.0)
MCHC: 31.2 g/dL (ref 30.0–36.0)
MCV: 84.9 fL (ref 80.0–100.0)
Platelets: 176 10*3/uL (ref 150–400)
RBC: 5.02 MIL/uL (ref 4.22–5.81)
RDW: 13.8 % (ref 11.5–15.5)
WBC: 5.1 10*3/uL (ref 4.0–10.5)
nRBC: 0 % (ref 0.0–0.2)

## 2019-04-19 LAB — SURGICAL PCR SCREEN
MRSA, PCR: NEGATIVE
Staphylococcus aureus: POSITIVE — AB

## 2019-04-19 NOTE — Progress Notes (Signed)
This patient scored at an elevated risk an elevated for OSA during a pre-surgical visit using the STOP BANG TOOL. A score of 5 or greater is considered an elevated risk.He scored a 6.

## 2019-04-19 NOTE — Progress Notes (Signed)
This patient has screened at an elevated risk for obstructive sleep apnea using the STOP-Bang tool during a pre-surgical visit.

## 2019-04-19 NOTE — Progress Notes (Signed)
I called a prescription for Mupirocin ointment to [facility] [address], [place].

## 2019-04-19 NOTE — Progress Notes (Signed)
PCP - Sherene Sires MD Cardiologist - none  Chest x-ray -  EKG - 04-19-2019 Stress Test - n/a ECHO - n/a Cardiac Cath -n/a   Sleep Study -n/a  Positive score on OSA screening CPAP -   Fasting Blood Sugar - n/a Checks Blood Sugar _____ times a day  Blood Thinner Instructions n/a: Aspirin Instructions:Instructed to call surgeon's office for instructions  Anesthesia review:   Patient denies shortness of breath, fever, cough and chest pain at PAT appointment   Patient verbalized understanding of instructions that were given to them at the PAT appointment. Patient was also instructed that they will need to review over the PAT instructions again at home before surgery.

## 2019-04-19 NOTE — Progress Notes (Signed)
Sleep apnea score sent to PCP.

## 2019-04-19 NOTE — Pre-Procedure Instructions (Signed)
Russell Garcia  04/19/2019      [id number] [place] [place] [address] [place] [address] Phone: [phone] Fax: [phone]    Your procedure is scheduled on 04-24-2019  Tuesday   Report to [facility] Admitting at [facility] this number if you have problems the morning of surgery:  [phone]   Remember:  Do not eat food  after midnight.     You may drink clear liquids until 11:15 AM.  Clear liquids allowed are:                    Water, Juice (non-citric and without pulp), Carbonated beverages, Clear Tea, Black Coffee only, Gatorade and Plain Popsicles only    Complete the pre surgical  drink you were given at your pre-admission appointment by 11:15 AM    Take these medicines the morning of surgery with A SIP OF WATER  Flonase nasal spry if needed Labetalol(Normodyne)   STOP TAKING ANY ASPIRIN (UNLESS OTHERWISE INSTRUCTED BY YOUR SURGEON),ANTIINFLAMATORIES (IBUPROFEN,ALEVE,MOTRIN,ADVIL,GOODY'S POWDERS),HERBAL SUPPLEMENTS,FISH OIL,AND VITAMINS 5-7 DAYS PRIOR TO SURGERY  Stop mobic  If no instructions were given call your surgeon's office for instructions      Do not wear jewelry, .  Do not wear lotions, powders, or perfumes, or deodorant.   .  Men may shave face and neck.  Do not bring valuables to the hospital.  [facility] is not responsible for any belongings or valuables.  Contacts, dentures or bridgework may not be worn into surgery.   .  For patients admitted to the hospital, discharge time will be determined by your treatment team.  Patients discharged the day of surgery will not be allowed to drive home.    [facility] - Preparing for Surgery  Before surgery, you can play an important role.  Because skin is not sterile, your skin needs to be as free of germs as possible.  You can reduce the number of germs on you skin by washing with CHG  (chlorahexidine gluconate) soap before surgery.  CHG is an antiseptic cleaner which kills germs and bonds with the skin to continue killing germs even after washing.  Oral Hygiene is also important in reducing the risk of infection.  Remember to brush your teeth with your regular toothpaste the morning of surgery.  Please DO NOT use if you have an allergy to CHG or antibacterial soaps.  If your skin becomes reddened/irritated stop using the CHG and inform your nurse when you arrive at Short Stay.  Do not shave (including legs and underarms) for at least 48 hours prior to the first CHG shower.  You may shave your face.  Please follow these instructions carefully:   1.  Shower with CHG Soap the night before surgery and the morning of Surgery.  2.  If you choose to wash your hair, wash your hair first as usual with your normal shampoo.  3.  After you shampoo, rinse your hair and body thoroughly to remove the shampoo. 4.  Use CHG as you would any other liquid soap.  You can apply chg directly to the skin and wash gently with a      scrungie or washcloth.           5.  Apply the CHG Soap to your body ONLY FROM THE NECK DOWN.   Do not use on open  wounds or open sores. Avoid contact with your eyes, ears, mouth and genitals (private parts).  Wash genitals (private parts) with your normal soap.  6.  Wash thoroughly, paying special attention to the area where your surgery will be performed.  7.  Thoroughly rinse your body with warm water from the neck down.  8.  DO NOT shower/wash with your normal soap after using and rinsing off the CHG Soap.  9.  Pat yourself dry with a clean towel.            10.  Wear clean pajamas.            11.  Place clean sheets on your bed the night of your first shower and do not sleep with pets.  Day of Surgery  Do not apply any lotions/deoderants the morning of surgery.   Please wear clean clothes to the hospital/surgery center. Remember to brush your teeth with  toothpaste.    Please read over the following fact sheets that you were given. Pain Booklet and Surgical Site Infection Prevention

## 2019-04-20 ENCOUNTER — Other Ambulatory Visit (HOSPITAL_COMMUNITY)
Admission: RE | Admit: 2019-04-20 | Discharge: 2019-04-20 | Disposition: A | Discharge status: Home / Self Care | Payer: Medicare HMO | Source: Ambulatory Visit | Attending: Orthopaedic Surgery | Admitting: Orthopaedic Surgery

## 2019-04-20 ENCOUNTER — Encounter (HOSPITAL_COMMUNITY): Payer: Self-pay | Admitting: Vascular Surgery

## 2019-04-20 DIAGNOSIS — Z1159 Encounter for screening for other viral diseases: Secondary | ICD-10-CM | POA: Diagnosis not present

## 2019-04-20 DIAGNOSIS — Z01818 Encounter for other preprocedural examination: Secondary | ICD-10-CM | POA: Diagnosis not present

## 2019-04-20 LAB — SARS CORONAVIRUS 2 (TAT 6-24 HRS): SARS Coronavirus 2: NEGATIVE

## 2019-04-20 NOTE — Progress Notes (Signed)
Anesthesia Chart Review:  Case: [id number] Date/Time: 04/24/19 1350   Procedure: RIGHT TOTAL HIP ARTHROPLASTY ANTERIOR APPROACH (Right )   Anesthesia type: Spinal   Pre-op diagnosis: Osteoarthritis right hip   Location: [facility] ROOM 04 / [facility]   Surgeon: Mcarthur Rossetti, MD      DISCUSSION: Patient is a 63 year old male scheduled for the above procedure.  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 No reported CKD, but Cr 1.48 is similar to labs in 2018.   Presurgical COVID test on 04/20/19.  BP Readings from Last 3 Encounters:  04/19/19 (!) 184/106  07/06/17 (!) 196/120  06/15/16 (!) 170/100  Med list includes: labetalol 300 mg TID.  I attempted to contact patient to clarify his PCP and BP trends as a significantly elevated BP on the day of surgery could lead to case cancellation. I don't have any recent BP readings to compare the one reading of 184/106 done at PAT, but historically has had some similarly high reading in the past. Will re-attempt to contract patient on Monday and forward BP reading to Dr. Ninfa Linden.   VS: BP (!) 184/106 Comment: notified Cynthia RN  Pulse 63   Temp 36.6 C   Resp 20   Ht 6' (1.829 m)   SpO2 98%   BMI 40.04 kg/m  Unfortunately, BP not rechecked at PAT.   PROVIDERS: Audley Hose, MD is listed as PCP. PAT RN listed Lynne Leader, MD as PCP, but last visit seen was on 06/15/16.    LABS: Preoperative labs noted. Cr 1.48, previously 1.50 on 07/06/17. (all labs ordered are listed, but only abnormal results are displayed)  Labs Reviewed  SURGICAL PCR SCREEN - Abnormal; Notable for the following components:      Result Value   Staphylococcus aureus POSITIVE (*)    All other components within normal limits  BASIC METABOLIC PANEL - Abnormal; Notable for the following components:   Creatinine, Ser 1.48 (*)    GFR calc non Af Amer 50 (*)    GFR calc Af Amer 58 (*)    All other components within normal limits  CBC     EKG: 04/19/19: Sinus bradycardia at 57 BPM Nonspecific T wave abnormality Abnormal ECG No old tracing to compare Confirmed by Candee Furbish [phone]) on 04/20/2019 2:23:06 PM   CV: Denied.  Past Medical History:  Diagnosis Date  . Anxiety   . Arthritis   . Complication of anesthesia   . Depression   . 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 Hypertension 2009    Past Surgical History:  Procedure Laterality Date  . INGUINAL HERNIA REPAIR    . R BKA      MEDICATIONS: . aspirin EC 81 MG tablet  . fluticasone (FLONASE) 50 MCG/ACT nasal spray  . labetalol (NORMODYNE) 300 MG tablet  . meloxicam (MOBIC) 15 MG tablet   No current facility-administered medications for this encounter.     Myra Gianotti, PA-C Surgical Short Stay/Anesthesiology [facility] Phone [phone] [facility] Phone [phone] 04/20/2019 5:19 PM

## 2019-04-23 MED ORDER — DEXTROSE 5 % IV SOLN
3.0000 g | INTRAVENOUS | Status: DC
  Filled 2019-04-23 (×2): qty 3000

## 2019-04-24 ENCOUNTER — Ambulatory Visit (HOSPITAL_COMMUNITY): Admission: RE | Admit: 2019-04-24 | Payer: Medicare HMO | Source: Home / Self Care | Admitting: Orthopaedic Surgery

## 2019-04-24 ENCOUNTER — Encounter (HOSPITAL_COMMUNITY): Admission: RE | Payer: Self-pay | Source: Home / Self Care

## 2019-04-24 SURGERY — ARTHROPLASTY, HIP, TOTAL, ANTERIOR APPROACH
Anesthesia: Spinal | Laterality: Right

## 2019-04-26 ENCOUNTER — Encounter: Payer: Self-pay | Admitting: Family Medicine

## 2019-04-26 ENCOUNTER — Other Ambulatory Visit: Payer: Self-pay

## 2019-04-26 ENCOUNTER — Ambulatory Visit (INDEPENDENT_AMBULATORY_CARE_PROVIDER_SITE_OTHER): Payer: Medicare HMO | Admitting: Family Medicine

## 2019-04-26 VITALS — BP 170/115 | HR 64 | Temp 98.0°F | Wt 294.0 lb

## 2019-04-26 DIAGNOSIS — I10 Essential (primary) hypertension: Secondary | ICD-10-CM | POA: Diagnosis not present

## 2019-04-26 DIAGNOSIS — M1611 Unilateral primary osteoarthritis, right hip: Secondary | ICD-10-CM | POA: Diagnosis not present

## 2019-04-26 MED ORDER — LISINOPRIL-HYDROCHLOROTHIAZIDE 20-25 MG PO TABS: 1.0000 | ORAL_TABLET | Freq: Every day | ORAL | 0 refills | Status: DC

## 2019-04-26 MED ORDER — CARVEDILOL 12.5 MG PO TABS: 12.5000 mg | ORAL_TABLET | Freq: Two times a day (BID) | ORAL | 3 refills | Status: DC

## 2019-04-26 MED ORDER — AMLODIPINE BESYLATE 10 MG PO TABS: 10.0000 mg | ORAL_TABLET | Freq: Every day | ORAL | 1 refills | Status: DC

## 2019-04-26 NOTE — Progress Notes (Signed)
Russell Garcia is a 63 y.o. male who presents to [facility]: [facility] today for hypertension.  I last saw [name] about 3 years ago.  He was having hip pain and hypertension.  He was somewhat lost to follow-up as he transitioned his medical care to a different primary care provider.  However he like to reestablish care.  He is just about to be scheduled for right total hip replacement however it was canceled because his blood pressure was too high.  He is currently managed with labetalol 300 mg 3 times daily and lisinopril/hydrochlorothiazide 20/25.  He also occasionally takes meloxicam for pain.  He does not check his blood pressure regularly.  He denies chest pain palpitation shortness of breath.    ROS as above:  Exam:  BP (!) 170/115   Pulse 64   Temp 98 F (36.7 C) (Oral)   Wt 294 lb (133.4 kg)   BMI 39.87 kg/m  Wt Readings from Last 5 Encounters:  04/26/19 294 lb (133.4 kg)  03/15/19 295 lb 3.2 oz (133.9 kg)  12/19/18 270 lb (122.5 kg)  07/05/17 270 lb (122.5 kg)  06/03/17 278 lb (126.1 kg)    Gen: Well NAD HEENT: EOMI,  MMM Lungs: Normal work of breathing. CTABL Heart: RRR no MRG Abd: NABS, Soft. Nondistended, Nontender Extremities: Nonedematous left lower extremity.  Right below the knee amputation   Lab and Radiology Results Recent Results (from the past 2160 hour(s))  Novel Coronavirus, NAA ([facility])     Status: None   Collection Time: 03/25/19  9:25 AM  Result Value Ref Range   SARS-CoV-2, NAA Not Detected Not Detected    Comment: This test was developed and its performance characteristics determined by [employer]. This test has not been FDA cleared or approved. This test has been authorized by FDA under an Emergency Use Authorization (EUA). This test is only authorized for the duration of time the declaration that circumstances exist justifying  the authorization of the emergency use of in vitro diagnostic tests for detection of SARS-CoV-2 virus and/or diagnosis of COVID-19 infection under section 564(b)(1) of the Act, 21 U.S.C. 235TIR-4(E)(3), unless the authorization is terminated or revoked sooner. When diagnostic testing is negative, the possibility of a false negative result should be considered in the context of a patient's recent exposures and the presence of clinical signs and symptoms consistent with COVID-19. An individual without symptoms of COVID-19 and who is not shedding SARS-CoV-2 virus would expect to have a negative (not detected) result in this assay.   Basic metabolic panel     Status: Abnormal   Collection Time: 04/19/19 12:59 PM  Result Value Ref Range   Sodium 141 135 - 145 mmol/L   Potassium 3.8 3.5 - 5.1 mmol/L   Chloride 105 98 - 111 mmol/L   CO2 25 22 - 32 mmol/L   Glucose, Bld 94 70 - 99 mg/dL   BUN 20 8 - 23 mg/dL   Creatinine, Ser 1.48 (H) 0.61 - 1.24 mg/dL   Calcium 9.7 8.9 - 10.3 mg/dL   GFR calc non Af Amer 50 (L) >60 mL/min   GFR calc Af Amer 58 (L) >60 mL/min   Anion gap 11 5 - 15    Comment: Performed at [facility], [place] [address] [place], [place] [address]  CBC     Status: None   Collection Time: 04/19/19 12:59 PM  Result Value Ref Range  WBC 5.1 4.0 - 10.5 K/uL   RBC 5.02 4.22 - 5.81 MIL/uL   Hemoglobin 13.3 13.0 - 17.0 g/dL   HCT 42.6 39.0 - 52.0 %   MCV 84.9 80.0 - 100.0 fL   MCH 26.5 26.0 - 34.0 pg   MCHC 31.2 30.0 - 36.0 g/dL   RDW 13.8 11.5 - 15.5 %   Platelets 176 150 - 400 K/uL    Comment: REPEATED TO VERIFY   nRBC 0.0 0.0 - 0.2 %    Comment: Performed at [facility], [place] [address] [place], [place] [address]  Surgical pcr screen     Status: Abnormal   Collection Time: 04/19/19 12:59 PM   Specimen: Nasal Mucosa; Nasal Swab  Result Value Ref Range   MRSA, PCR NEGATIVE NEGATIVE   Staphylococcus aureus POSITIVE (A) NEGATIVE    Comment: (NOTE) The  Xpert SA Assay (FDA approved for NASAL specimens in patients 38 years of age and older), is one component of a comprehensive surveillance program. It is not intended to diagnose infection nor to guide or monitor treatment. Performed at [facility], [place] [address], [place], [place] [address]   SARS Coronavirus 2 (Performed in [place] hospital lab)     Status: None   Collection Time: 04/20/19  9:43 AM   Specimen: Nasal Swab  Result Value Ref Range   SARS Coronavirus 2 NEGATIVE NEGATIVE    Comment: (NOTE) SARS-CoV-2 target nucleic acids are NOT DETECTED. The SARS-CoV-2 RNA is generally detectable in upper and lower respiratory specimens during the acute phase of infection. Negative results do not preclude SARS-CoV-2 infection, do not rule out co-infections with other pathogens, and should not be used as the sole basis for treatment or other patient management decisions. Negative results must be combined with clinical observations, patient history, and epidemiological information. The expected result is Negative. Fact Sheet for Patients: SugarRoll.be Fact Sheet for Healthcare Providers: https://www.woods-mathews.com/ This test is not yet approved or cleared by the [place] FDA and  has been authorized for detection and/or diagnosis of SARS-CoV-2 by FDA under an Emergency Use Authorization (EUA). This EUA will remain  in effect (meaning this test can be used) for the duration of the COVID-19 declaration under Section 56 4(b)(1) of the Act, 21 U.S.C. section 360bbb-3(b)(1), unless the authorization is terminated or revoked sooner. Performed at [facility], [place] [address] [place], [place] [address]       Assessment and Plan: 63 y.o. male with  Hypertension: Not controlled.  Plan to continue lisinopril/hydrochlorothiazide.  Will discontinue labetalol this is likely not very effective and has 3 times daily dosing.  We  will switch to Coreg 12.5 twice daily.  Additionally will add amlodipine.  Check back in 1 week.  Advance regimen as tolerated based on heart rate and blood pressure.  Plan for prompt return to surgery if possible.   Patient has multiple other issues that need to be followed up as well including lipid screening obesity management colonoscopy etc.  PDMP not reviewed this encounter. No orders of the defined types were placed in this encounter.  No orders of the defined types were placed in this encounter.    Historical information moved to improve visibility of documentation.  Past Medical History:  Diagnosis Date  . Anxiety   . Arthritis   . Complication of anesthesia   . Depression   . Hx of leg amputation ([place])    lost due to a birth defect  R (BKA)  . Hypertension 2009   Past Surgical History:  Procedure Laterality Date  . INGUINAL HERNIA REPAIR    . R BKA     Social History   Tobacco Use  . Smoking status: Never Smoker  . Smokeless tobacco: Never Used  Substance Use Topics  . Alcohol use: No    Alcohol/week: 0.0 standard drinks   family history includes Heart attack (age of onset: 9) in his mother; Leukemia in his father.  Medications: Current Outpatient Medications  Medication Sig Dispense Refill  . aspirin EC 81 MG tablet Take 81 mg by mouth daily.    . fluticasone (FLONASE) 50 MCG/ACT nasal spray Place 1 spray into both nostrils daily as needed.    . labetalol (NORMODYNE) 300 MG tablet Take 300 mg by mouth 3 (three) times daily.    . meloxicam (MOBIC) 15 MG tablet Take 15 mg by mouth daily.     No current facility-administered medications for this visit.    No Known Allergies   Discussed warning signs or symptoms. Please see discharge instructions. Patient expresses understanding.

## 2019-04-26 NOTE — Patient Instructions (Signed)
Thank you for coming in today. Continue lisinopril/HCTZ STOP labetalol START Carvedilol (coreg) twice daily Start amlodipine daily. OK to take at night if you want.   Recheck next week at the end of the week.   We will continue to adjust the blood pressure.

## 2019-05-03 ENCOUNTER — Ambulatory Visit (INDEPENDENT_AMBULATORY_CARE_PROVIDER_SITE_OTHER): Payer: Medicare HMO | Admitting: Family Medicine

## 2019-05-03 ENCOUNTER — Encounter: Payer: Self-pay | Admitting: Family Medicine

## 2019-05-03 ENCOUNTER — Other Ambulatory Visit: Payer: Self-pay

## 2019-05-03 VITALS — BP 165/105 | HR 90 | Temp 98.0°F | Ht 72.0 in | Wt 293.0 lb

## 2019-05-03 DIAGNOSIS — E785 Hyperlipidemia, unspecified: Secondary | ICD-10-CM

## 2019-05-03 DIAGNOSIS — I10 Essential (primary) hypertension: Secondary | ICD-10-CM

## 2019-05-03 MED ORDER — CARVEDILOL 25 MG PO TABS: 25.0000 mg | ORAL_TABLET | Freq: Two times a day (BID) | ORAL | 1 refills | Status: DC

## 2019-05-03 MED ORDER — SPIRONOLACTONE 25 MG PO TABS: 25.0000 mg | ORAL_TABLET | Freq: Every day | ORAL | 1 refills | Status: DC

## 2019-05-03 NOTE — Progress Notes (Signed)
Russell Garcia is a 63 y.o. male who presents to [facility]: [facility] today for follow-up on uncontrolled HTN. He wants to continue with a total right hip replacement that was canceled due to high blood pressure. Patient says that his at-home BP measurements are in the 150/90 range. He says that he missed his Coreg last night but took his Carvedilol this morning.  He currently is taking lisinopril/hydrochlorothiazide 20/25, Coreg 12.5 twice daily, and amlodipine 10 mg daily.  Patient is having severe pain in his right hip due to OA.    ROS as above:  Exam:  BP (!) 165/105   Pulse 90   Temp 98 F (36.7 C) (Oral)   Ht 6' (1.829 m)   Wt 293 lb (132.9 kg)   SpO2 96%   BMI 39.74 kg/m  Wt Readings from Last 5 Encounters:  05/03/19 293 lb (132.9 kg)  04/26/19 294 lb (133.4 kg)  03/15/19 295 lb 3.2 oz (133.9 kg)  12/19/18 270 lb (122.5 kg)  07/05/17 270 lb (122.5 kg)    Gen: Well NAD HEENT: EOMI,  MMM Lungs: Normal work of breathing. CTABL Heart: RRR no MRG Abd: NABS, Soft. Nondistended, Nontender Exts: Left lower extremity brisk capillary refill, warm and well perfused.   Lab and Radiology Results No results found for this or any previous visit (from the past 72 hour(s)). No results found.    Assessment and Plan: 63 y.o. male with Hx of HTN, morbid obesity, hyperlipidemia, and OA of right hip is presenting for follow-up on uncontrolled HTN. He wants to continue with a total right hip replacement that was canceled due to high blood pressure. BP continues to be poorly controlled at 165/105.  Increased dose of Carvedilol to 25 mg BID.   Start Spironolactone 25 mg once a day.  Get CMP to check K+ levels on Tues or Weds of next week after starting Spironolactone  Follow-up in 1 week.  PDMP not reviewed this encounter. Orders Placed This Encounter  Procedures  .  COMPLETE METABOLIC PANEL WITH GFR  . LDL cholesterol, direct   Meds ordered this encounter  Medications  . carvedilol (COREG) 25 MG tablet    Sig: Take 1 tablet (25 mg total) by mouth 2 (two) times daily with a meal.    Dispense:  60 tablet    Refill:  1    Replaces labetelol  . spironolactone (ALDACTONE) 25 MG tablet    Sig: Take 1 tablet (25 mg total) by mouth daily.    Dispense:  30 tablet    Refill:  1     Historical information moved to improve visibility of documentation.  Past Medical History:  Diagnosis Date  . Anxiety   . Arthritis   . Complication of anesthesia   . Depression   . Hx of leg amputation ([place])    lost due to a birth defect R (BKA)  . Hypertension 2009   Past Surgical History:  Procedure Laterality Date  . INGUINAL HERNIA REPAIR    . R BKA     Social History   Tobacco Use  . Smoking status: Never Smoker  . Smokeless tobacco: Never Used  Substance Use Topics  . Alcohol use: No    Alcohol/week: 0.0 standard drinks   family history includes Heart attack (age of onset: 33) in his mother; Leukemia in his father.  Medications: Current Outpatient Medications  Medication Sig Dispense Refill  . amLODipine (  NORVASC) 10 MG tablet Take 1 tablet (10 mg total) by mouth daily. 90 tablet 1  . aspirin EC 81 MG tablet Take 81 mg by mouth daily.    . carvedilol (COREG) 25 MG tablet Take 1 tablet (25 mg total) by mouth 2 (two) times daily with a meal. 60 tablet 1  . fluticasone (FLONASE) 50 MCG/ACT nasal spray Place 1 spray into both nostrils daily as needed.    [name] lisinopril-hydrochlorothiazide (ZESTORETIC) 20-25 MG tablet Take 1 tablet by mouth daily. 90 tablet 0  . meloxicam (MOBIC) 15 MG tablet Take 15 mg by mouth daily.    . tamsulosin (FLOMAX) 0.4 MG CAPS capsule Take 1 capsule by mouth daily.    [name] spironolactone (ALDACTONE) 25 MG tablet Take 1 tablet (25 mg total) by mouth daily. 30 tablet 1   No current facility-administered medications for this visit.     No Known Allergies   Discussed warning signs or symptoms. Please see discharge instructions. Patient expresses understanding.  I personally was present and performed or re-performed the history, physical exam and medical decision-making activities of this service and have verified that the service and findings are accurately documented in the student's note. ___________________________________________ Lynne Leader M.D., ABFM., CAQSM. Primary Care and Sports Medicine Adjunct Instructor of [employer]

## 2019-05-03 NOTE — Patient Instructions (Signed)
Thank you for coming in today. Continue lisinopril/hctz Continue amlodipine Increase coreg to 25mg  twice daily. New prescription sent.  Add spironolactone daily.  Get labs around Tuesday or Wed of next week.  Check back with me in 1 week.  Keep track of blood pressure.  If you feel bad let me know and I can adjust.

## 2019-05-10 ENCOUNTER — Ambulatory Visit (INDEPENDENT_AMBULATORY_CARE_PROVIDER_SITE_OTHER): Payer: Medicare HMO | Admitting: Family Medicine

## 2019-05-10 ENCOUNTER — Encounter: Payer: Self-pay | Admitting: Family Medicine

## 2019-05-10 ENCOUNTER — Other Ambulatory Visit: Payer: Self-pay

## 2019-05-10 VITALS — BP 133/83 | HR 69 | Temp 98.1°F | Wt 289.0 lb

## 2019-05-10 DIAGNOSIS — I10 Essential (primary) hypertension: Secondary | ICD-10-CM | POA: Diagnosis not present

## 2019-05-10 DIAGNOSIS — Z5181 Encounter for therapeutic drug level monitoring: Secondary | ICD-10-CM | POA: Diagnosis not present

## 2019-05-10 NOTE — Progress Notes (Signed)
Russell Garcia is a 63 y.o. male who presents to [facility]: [facility] today for follow-up hypertension.  Shinichi has been seen several times recently for hypertension.  His blood pressure regimen has been titrated up to include as listed below.  Spironolactone was added last week.  He feels well with no chest pain palpitations lightheadedness or shortness of breath.  His total hip replacement was delayed due to hypertension and now that his blood pressure is better controlled he would very much like to proceed with total hip replacement if possible as he has quite a bit of pain.   ROS as above:  Exam:  BP 133/83   Pulse 69   Temp 98.1 F (36.7 C) (Oral)   Wt 289 lb (131.1 kg)   BMI 39.20 kg/m  Wt Readings from Last 5 Encounters:  05/10/19 289 lb (131.1 kg)  05/03/19 293 lb (132.9 kg)  04/26/19 294 lb (133.4 kg)  03/15/19 295 lb 3.2 oz (133.9 kg)  12/19/18 270 lb (122.5 kg)    Gen: Well NAD HEENT: EOMI,  MMM Lungs: Normal work of breathing. CTABL Heart: RRR no MRG Abd: NABS, Soft. Nondistended, Nontender Exts: Brisk capillary refill, warm and well perfused.     Assessment and Plan: 63 y.o. male with hypertension.  Much better controlled.  Plan to continue current regimen.  Check previously ordered metabolic panel for renal function as well as potassium on spironolactone.  If all is well proceed to surgery.  We will continue long-term management of medications once labs are back.  Recheck back with me in about a month or 2.  At that point he should be status post surgery and ready to continue to work on his other chronic medical problems.  PDMP not reviewed this encounter. No orders of the defined types were placed in this encounter.  No orders of the defined types were placed in this encounter.    Historical information moved to improve visibility of documentation.   Past Medical History:  Diagnosis Date  . Anxiety   . Arthritis   . Complication of anesthesia   . Depression   . Hx of leg amputation ([place])    lost due to a birth defect R (BKA)  . Hypertension 2009   Past Surgical History:  Procedure Laterality Date  . INGUINAL HERNIA REPAIR    . R BKA     Social History   Tobacco Use  . Smoking status: Never Smoker  . Smokeless tobacco: Never Used  Substance Use Topics  . Alcohol use: No    Alcohol/week: 0.0 standard drinks   family history includes Heart attack (age of onset: 80) in his mother; Leukemia in his father.  Medications: Current Outpatient Medications  Medication Sig Dispense Refill  . amLODipine (NORVASC) 10 MG tablet Take 1 tablet (10 mg total) by mouth daily. 90 tablet 1  . aspirin EC 81 MG tablet Take 81 mg by mouth daily.    . carvedilol (COREG) 25 MG tablet Take 1 tablet (25 mg total) by mouth 2 (two) times daily with a meal. 60 tablet 1  . fluticasone (FLONASE) 50 MCG/ACT nasal spray Place 1 spray into both nostrils daily as needed.    [name] lisinopril-hydrochlorothiazide (ZESTORETIC) 20-25 MG tablet Take 1 tablet by mouth daily. 90 tablet 0  . meloxicam (MOBIC) 15 MG tablet Take 15 mg by mouth daily.    [name] spironolactone (ALDACTONE) 25 MG tablet Take 1  tablet (25 mg total) by mouth daily. 30 tablet 1  . tamsulosin (FLOMAX) 0.4 MG CAPS capsule Take 1 capsule by mouth daily.     No current facility-administered medications for this visit.    No Known Allergies   Discussed warning signs or symptoms. Please see discharge instructions. Patient expresses understanding.

## 2019-05-10 NOTE — Patient Instructions (Signed)
Thank you for coming in today. Try miralax for constipation  Continue medicine  BP ok for surgery.  Follow up after surgery in a month or so.

## 2019-05-10 NOTE — Progress Notes (Signed)
Note duplication 

## 2019-05-11 DIAGNOSIS — I10 Essential (primary) hypertension: Secondary | ICD-10-CM | POA: Diagnosis not present

## 2019-05-11 DIAGNOSIS — E785 Hyperlipidemia, unspecified: Secondary | ICD-10-CM | POA: Diagnosis not present

## 2019-05-12 LAB — COMPLETE METABOLIC PANEL WITH GFR
AG Ratio: 1.4 (calc) (ref 1.0–2.5)
ALT: 12 U/L (ref 9–46)
AST: 15 U/L (ref 10–35)
Albumin: 4.3 g/dL (ref 3.6–5.1)
Alkaline phosphatase (APISO): 47 U/L (ref 35–144)
BUN/Creatinine Ratio: 15 (calc) (ref 6–22)
BUN: 21 mg/dL (ref 7–25)
CO2: 29 mmol/L (ref 20–32)
Calcium: 9.9 mg/dL (ref 8.6–10.3)
Chloride: 102 mmol/L (ref 98–110)
Creat: 1.36 mg/dL — ABNORMAL HIGH (ref 0.70–1.25)
GFR, Est African American: 64 mL/min/{1.73_m2} (ref >=60)
GFR, Est Non African American: 55 mL/min/{1.73_m2} — ABNORMAL LOW (ref >=60)
Globulin: 3.1 g/dL (calc) (ref 1.9–3.7)
Glucose, Bld: 100 mg/dL — ABNORMAL HIGH (ref 65–99)
Potassium: 4.1 mmol/L (ref 3.5–5.3)
Sodium: 139 mmol/L (ref 135–146)
Total Bilirubin: 0.6 mg/dL (ref 0.2–1.2)
Total Protein: 7.4 g/dL (ref 6.1–8.1)

## 2019-05-12 LAB — LDL CHOLESTEROL, DIRECT: Direct LDL: 171 mg/dL — ABNORMAL HIGH (ref <100)

## 2019-05-30 ENCOUNTER — Other Ambulatory Visit: Payer: Self-pay | Admitting: Physician Assistant

## 2019-05-31 ENCOUNTER — Other Ambulatory Visit: Payer: Self-pay

## 2019-06-04 NOTE — Progress Notes (Addendum)
[id number] [place] [place] [address] [place] [address] Phone: [phone] Fax: [phone]    Your procedure is scheduled on Thursday, August 13th.  Report to [facility] Main Entrance "A" at 8:00 A.M., and check in at the Admitting office.  Call this number if you have problems the morning of surgery:  [phone]  Call [phone] if you have any questions prior to your surgery date Monday-Friday 8am-4pm   Remember:  Do not eat after midnight the night before your surgery  You may drink clear liquids until 7:00 the morning of your surgery.   Clear liquids allowed are: Water, Non-Citrus Juices (without pulp), Carbonated Beverages, Clear Tea, Black Coffee Only, and Gatorade  Please complete your PRE-SURGERY ENSURE that was provided to you by 9:00 the morning of surgery.  Please, if able, drink it in one setting. DO NOT SIP.    Take these medicines the morning of surgery with A SIP OF WATER carvedilol (COREG)   Follow your surgeon's instructions on when to stop Aspirin.  If no instructions were given by your surgeon then you will need to call the office to get those instructions.    7 days prior to surgery STOP taking any Aspirin (unless otherwise instructed by your surgeon), diclofenac sodium (VOLTAREN) , Aleve, Naproxen, Ibuprofen, Motrin, Advil, Goody's, BC's, all herbal medications, fish oil, and all vitamins.   The Morning of Surgery  Do not wear jewelry, make-up or nail polish.  Do not wear lotions, powders, or perfumes/colognes, or deodorant  Do not shave 48 hours prior to surgery.  Men may shave face and neck.  Do not bring valuables to the hospital.  [facility] is not responsible for any belongings or valuables.  If you are a smoker, DO NOT Smoke 24 hours prior to surgery IF you wear a CPAP at night please bring your mask, tubing, and machine the morning of surgery   Remember that  you must have someone to transport you home after your surgery, and remain with you for 24 hours if you are discharged the same day.  Contacts, glasses, hearing aids, dentures or bridgework may not be worn into surgery.   Leave your suitcase in the car.  After surgery it may be brought to your room.  For patients admitted to the hospital, discharge time will be determined by your treatment team.  Patients discharged the day of surgery will not be allowed to drive home.   Special instructions:   [facility]- Preparing For Surgery  Before surgery, you can play an important role. Because skin is not sterile, your skin needs to be as free of germs as possible. You can reduce the number of germs on your skin by washing with CHG (chlorahexidine gluconate) Soap before surgery.  CHG is an antiseptic cleaner which kills germs and bonds with the skin to continue killing germs even after washing.    Oral Hygiene is also important to reduce your risk of infection.  Remember - BRUSH YOUR TEETH THE MORNING OF SURGERY WITH YOUR REGULAR TOOTHPASTE  Please do not use if you have an allergy to CHG or antibacterial soaps. If your skin becomes reddened/irritated stop using the CHG.  Do not shave (including legs and underarms) for at least 48 hours prior to first CHG shower. It is OK to shave your face.  Please follow these instructions carefully.   1. Shower the NIGHT BEFORE SURGERY and the  MORNING OF SURGERY with CHG Soap.   2. If you chose to wash your hair, wash your hair first as usual with your normal shampoo.  3. After you shampoo, rinse your hair and body thoroughly to remove the shampoo.  4. Use CHG as you would any other liquid soap. You can apply CHG directly to the skin and wash gently with a scrungie or a clean washcloth.   5. Apply the CHG Soap to your body ONLY FROM THE NECK DOWN.  Do not use on open wounds or open sores. Avoid contact with your eyes, ears, mouth and genitals (private parts).  Wash Face and genitals (private parts)  with your normal soap.   6. Wash thoroughly, paying special attention to the area where your surgery will be performed.  7. Thoroughly rinse your body with warm water from the neck down.  8. DO NOT shower/wash with your normal soap after using and rinsing off the CHG Soap.  9. Pat yourself dry with a CLEAN TOWEL.  10. Wear CLEAN PAJAMAS to bed the night before surgery, wear comfortable clothes the morning of surgery  11. Place CLEAN SHEETS on your bed the night of your first shower and DO NOT SLEEP WITH PETS.  Day of Surgery:  Do not apply any deodorants/lotions. Please shower the morning of surgery with the CHG soap  Please wear clean clothes to the hospital/surgery center.   Remember to brush your teeth WITH YOUR REGULAR TOOTHPASTE.  Please read over the following fact sheets that you were given.

## 2019-06-05 ENCOUNTER — Telehealth: Payer: Self-pay | Admitting: Family Medicine

## 2019-06-05 ENCOUNTER — Inpatient Hospital Stay (HOSPITAL_COMMUNITY): Admission: RE | Admit: 2019-06-05 | Payer: Medicare HMO | Source: Ambulatory Visit

## 2019-06-05 ENCOUNTER — Other Ambulatory Visit: Payer: Self-pay

## 2019-06-05 ENCOUNTER — Encounter (HOSPITAL_COMMUNITY): Payer: Self-pay

## 2019-06-05 ENCOUNTER — Encounter (HOSPITAL_COMMUNITY)
Admission: RE | Admit: 2019-06-05 | Discharge: 2019-06-05 | Disposition: A | Discharge status: Home / Self Care | Payer: Medicare HMO | Source: Ambulatory Visit | Attending: Orthopaedic Surgery | Admitting: Orthopaedic Surgery

## 2019-06-05 LAB — BASIC METABOLIC PANEL
Anion gap: 5 (ref 5–15)
BUN: 18 mg/dL (ref 8–23)
CO2: 25 mmol/L (ref 22–32)
Calcium: 9.6 mg/dL (ref 8.9–10.3)
Chloride: 108 mmol/L (ref 98–111)
Creatinine, Ser: 1.25 mg/dL — ABNORMAL HIGH (ref 0.61–1.24)
GFR calc Af Amer: >60 mL/min (ref >60)
GFR calc non Af Amer: >60 mL/min (ref >60)
Glucose, Bld: 92 mg/dL (ref 70–99)
Potassium: 4.1 mmol/L (ref 3.5–5.1)
Sodium: 138 mmol/L (ref 135–145)

## 2019-06-05 LAB — SURGICAL PCR SCREEN
MRSA, PCR: NEGATIVE
Staphylococcus aureus: NEGATIVE

## 2019-06-05 NOTE — Progress Notes (Signed)
Elevated BP at PAT appointment, 202/100, rechecked 189/100. BP then rechecked manually at the end PAT appointment. 182/102 and 178/100. No complains of headache or blurry vision. Patient unsure of which BP meds currently taken. Ebony Hail, [place] made aware and to contact PCP for clarity. Patient also instructed to call PCP for clarification and then call Surgical Short Stay with correct BP meds to be given updated day of surgery medication instructions if needed. Patient verbalized understanding.

## 2019-06-05 NOTE — Telephone Encounter (Signed)
-----   Message from Jacinta Shoe, [place] sent at 06/05/2019 12:18 PM EDT ----- Regarding: HTN - OR 8/13 Dr. Georgina Snell,  Russell Garcia THR surgery was rescheduled for 06/07/19 (it had been postponed due to HTN). At his pre-op RN visit today, BP 182/102, 178/100. He did not have his medication bottles with him, so he wasn't completely sure about which medications he is taking. He is taking Coreg and believes the other one is Spironolactone. His understanding is that he is no longer suppose to be taking amlodipine and lisinopril/HCTZ. If this is incorrect, then could you have one of your staff members contact him to clarify what medications he is suppose to be on for HTN?   We have patients take b-blockers and calcium channel blockers on the morning of surgery with sips of water, but hold diuretics and ACE-I/ARBs on the morning of surgery.    Myra Gianotti, PA-C Surgical Short Stay/Anesthesiology [facility] Phone [phone] 06/05/2019 12:30 PM

## 2019-06-05 NOTE — Telephone Encounter (Signed)
Spoke with patient, he was only taking 2 of his BP medications. Pt states his has all four but just misunderstood . Pt will start them today.

## 2019-06-05 NOTE — Progress Notes (Addendum)
PCP - Dr. Georgina Snell, Amalia Hailey Cardiologist - denies   Chest x-ray - denies EKG - 04/19/19 Stress Test - denies ECHO - denies Cardiac Cath - denies  Sleep Study -  denies CPAP - N/A  Blood Thinner Instructions: N/A Aspirin Instructions: LD 06/04/19  Anesthesia review: Yes, elevated BP  Coronavirus Screening  Have you experienced the following symptoms:  Cough yes/no: No Fever (>100.69F)  yes/no: No Runny nose yes/no: No Sore throat yes/no: No Difficulty breathing/shortness of breath  yes/no: No  Have you or a family member traveled in the last 14 days and where? yes/no: No  If the patient indicates "YES" to the above questions, their PAT will be rescheduled to limit the exposure to others and, the surgeon will be notified. THE PATIENT WILL NEED TO BE ASYMPTOMATIC FOR 14 DAYS.   If the patient is not experiencing any of these symptoms, the PAT nurse will instruct them to NOT bring anyone with them to their appointment since they may have these symptoms or traveled as well.   Please remind your patients and families that hospital visitation restrictions are in effect and the importance of the restrictions.   Patient denies shortness of breath, fever, cough and chest pain at PAT appointment  Patient verbalized understanding of instructions that were given to them at the PAT appointment. Patient was also instructed that they will need to review over the PAT instructions again at home before surgery.

## 2019-06-05 NOTE — Progress Notes (Addendum)
Anesthesia Note:  Case: [id number] Date/Time: 06/07/19 0945   Procedure: RIGHT TOTAL HIP ARTHROPLASTY ANTERIOR APPROACH (Right )   Anesthesia type: Spinal   Pre-op diagnosis: osteoarthritis right hip   Location: [place] OR ROOM 05 / [place] [facility]   Surgeon: Mcarthur Rossetti, MD      DISCUSSION: Patient is a 63 year old male scheduled for the above procedure. Surgery was initially scheduled for 04/24/19, but PAT visit BP elevated at 184/106 and without recent primary care follow-up, so surgery postponed. Since then patient has been evaluated by Gregor Hams, MD on three occasions in July 2020.  Hypertension medications changed/adjusted to: Amlodipine 10 mg daily, carvedilol 25 mg twice daily, lisinopril-HCTZ 20-25 mg daily, and Spironolactone 25 mg daily, and at his 05/10/19 visit, BP was much improved at 133/83.  Patient had PAT RN visit on 06/05/19. SBP 178-202 and DBP 100-102. He reported only being on two BP medications, but was unclear about their names. He thought he was on Coreg and Spironolactone, but reportedly thought he was no longer supposed to be on amlodipine or lisinopril-HCTZ.  In review of Dr. Clovis Riley notes, I did not see mention that these were to be discontinued, so I communicated with Dr. Georgina Snell requesting his staff clarify with Mr. Hagemeister his prescribed anti-hypertensive regimen in hopes BP can get better controlled, as surgery would likely get delayed or cancelled if his day of surgery BP was significantly elevated. By 06/05/19 afternoon, Dr. Clovis Riley nurse had spoken with Mr. Kleinert, and he was to resume amlodipine and lisinopril-HCTZ the same day.   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 No reported CKD, but Cr 1.25-1.50 since 06/2017.   For unclear reasons, CBC was not done with his preoperative labs, but was normal in June 2020.  He will need a preoperative CBC. In addition, he did not get his COVID-19 test on 06/05/19, and when he called to scheduled on 06/06/19  the testing site was already closed.   I have been in discussion with Dr. Georgina Snell, his nurse Jolayne Haines and [place] at Dr. Trevor Mace office regarding Mr. Tabak's HTN. There was consideration of rescheduling surgery since BP poorly controlled at PAT, but he is now back on his medications. Ideally he was to get BP rechecked on 06/06/19 to see if any improvement, but so far this has not occurred. Currently, since he has resumed all of the BP medications prescribed by Dr. Georgina Snell, the plan is for patient to arrive for surgery and get vitals. If his BP is still significantly elevated then there is a possibility that his surgery could be cancelled; however, if reading acceptable then plan will be to proceed with STAT CBC and rapid COVID-19 test. Definitive plan pending all of these results. RN Short Stay Coordinator Lindsi updated. (UPDATE 06/06/19 5:46 PM: I left a voice message at 3:40 PM today for patient to call me or [place] to confirm medication instructions, but he has not yet responded. No answer again when I called at 5:45 PM. Will leave chart for late RN.)   VS: BP (!) 189/100 Comment: taken in right arm/notified RoNiqua RN  Pulse 64   Resp 20   Ht 6' (1.829 m)   Wt 130.8 kg   SpO2 95%   BMI 39.10 kg/m   PROVIDERS: Gregor Hams, MD is PCP    LABS: Preoperative labs noted. Cr 1.25, stable/improved when compared to labs since 03/2019. Apparently CBC not drawn at PAT, so will need to  be done on the day of surgery (CBC was normal on 04/19/19). LFTs WNL 05/11/19. (all labs ordered are listed, but only abnormal results are displayed)  Labs Reviewed  BASIC METABOLIC PANEL - Abnormal; Notable for the following components:      Result Value   Creatinine, Ser 1.25 (*)    All other components within normal limits  SURGICAL PCR SCREEN    EKG: 04/19/19: Sinus bradycardia at 57 BPM Nonspecific T wave abnormality Abnormal ECG No old tracing to compare Confirmed by Candee Furbish [phone]) on 04/20/2019  2:23:06 PM   CV: Denied.   Past Medical History:  Diagnosis Date  . Anxiety   . Arthritis   . Depression   . Hx of leg amputation ([place])    lost due to a birth defect R (BKA)  . Hypertension 2009    Past Surgical History:  Procedure Laterality Date  . INGUINAL HERNIA REPAIR    . R BKA      MEDICATIONS: . amLODipine (NORVASC) 10 MG tablet  . aspirin EC 325 MG tablet  . carvedilol (COREG) 25 MG tablet  . diclofenac sodium (VOLTAREN) 1 % GEL  . lisinopril-hydrochlorothiazide (ZESTORETIC) 20-25 MG tablet  . magnesium gluconate (MAGONATE) 500 MG tablet  . spironolactone (ALDACTONE) 25 MG tablet   No current facility-administered medications for this encounter.     Myra Gianotti, PA-C Surgical Short Stay/Anesthesiology [facility] Phone [phone] [facility] Phone [phone] 06/06/2019 3:40 PM

## 2019-06-05 NOTE — Telephone Encounter (Signed)
Please clarify with patient that he supposed to be taking all of the blood pressure medications that we have prescribed to him. This includes amlodipine, Coreg, lisinopril/hydrochlorothiazide, and spironolactone.  With all of these medications combined his blood pressure is well controlled and he should be able to have surgery.  Okay to reschedule surgery after starting all medications.  If he needs refill have a refill.

## 2019-06-06 ENCOUNTER — Telehealth: Payer: Self-pay | Admitting: Family Medicine

## 2019-06-06 NOTE — Telephone Encounter (Signed)
Left message for return call, or for patient to stop by for BP check.

## 2019-06-06 NOTE — Telephone Encounter (Signed)
-----   Message from Jacinta Shoe, PA-C sent at 06/05/2019  1:34 PM EDT ----- Regarding: RE: HTN - OR 8/13 Dr. Georgina Snell, Just to clarify, I need to let the surgeon know to reschedule surgery until after Mr. Borchard has HTN follow-up?  He did not seem sure of his medication names, so he may tell you something different once he's at home with his pill bottles. Regardless, his BP was high this morning--and he told our nurse he had his morning medications (but was due for one in the afternoon).   I just wanted to make sure he wasn't having any type of BP follow-up between now and 8/13 before I notify Dr. Trevor Mace office.  Thanks, Myra Gianotti, PA-C Surgical Short Stay/Anesthesiology [facility] Phone [phone] 06/05/2019 1:50 PM       ----- Message ----- From: Gregor Hams, MD Sent: 06/05/2019   1:08 PM EDT To: Jacinta Shoe, PA-C Subject: RE: HTN - OR 8/13                              He supposed to be taking all of his medications.  We will send a staff message to him to tell him to restart all of his blood pressure medications.  He has difficult to control hypertension requiring multiple different medications.  I do not know where he heard to stop his medicines.  Please reschedule.  Ellard Artis ----- Message ----- From: Rutherford Limerick Sent: 06/05/2019  12:18 PM EDT To: Gregor Hams, MD Subject: HTN - OR 8/13                                  Dr. Renea Ee Oviatt's THR surgery was rescheduled for 06/07/19 (it had been postponed due to HTN). At his pre-op RN visit today, BP 182/102, 178/100. He did not have his medication bottles with him, so he wasn't completely sure about which medications he is taking. He is taking Coreg and believes the other one is Spironolactone. His understanding is that he is no longer suppose to be taking amlodipine and lisinopril/HCTZ. If this is incorrect, then could you have one of your staff members contact him to clarify what medications he is suppose  to be on for HTN?   We have patients take b-blockers and calcium channel blockers on the morning of surgery with sips of water, but hold diuretics and ACE-I/ARBs on the morning of surgery.    Myra Gianotti, PA-C Surgical Short Stay/Anesthesiology [facility] Phone [phone] 06/05/2019 12:30 PM

## 2019-06-06 NOTE — Telephone Encounter (Signed)
Please advise patient to schedule nurse visit today to check BP prior to surgery tmr.

## 2019-06-07 ENCOUNTER — Encounter (HOSPITAL_COMMUNITY)
Admission: AD | Disposition: A | Discharge status: Home Health | Payer: Self-pay | Source: Home / Self Care | Attending: Orthopaedic Surgery

## 2019-06-07 ENCOUNTER — Ambulatory Visit (HOSPITAL_COMMUNITY): Payer: Medicare HMO

## 2019-06-07 ENCOUNTER — Ambulatory Visit (HOSPITAL_COMMUNITY): Payer: Medicare HMO | Admitting: Vascular Surgery

## 2019-06-07 ENCOUNTER — Other Ambulatory Visit: Payer: Self-pay

## 2019-06-07 ENCOUNTER — Inpatient Hospital Stay (HOSPITAL_COMMUNITY)
Admission: AD | Admit: 2019-06-07 | Discharge: 2019-06-09 | DRG: 469 | Disposition: A | Discharge status: Home Health | Payer: Medicare HMO | Attending: Orthopaedic Surgery | Admitting: Orthopaedic Surgery

## 2019-06-07 ENCOUNTER — Observation Stay (HOSPITAL_COMMUNITY): Payer: Medicare HMO

## 2019-06-07 ENCOUNTER — Encounter (HOSPITAL_COMMUNITY): Payer: Self-pay

## 2019-06-07 ENCOUNTER — Ambulatory Visit (HOSPITAL_COMMUNITY): Payer: Medicare HMO | Admitting: Certified Registered"

## 2019-06-07 DIAGNOSIS — Z6839 Body mass index (BMI) 39.0-39.9, adult: Secondary | ICD-10-CM

## 2019-06-07 DIAGNOSIS — Z89511 Acquired absence of right leg below knee: Secondary | ICD-10-CM

## 2019-06-07 DIAGNOSIS — Z20828 Contact with and (suspected) exposure to other viral communicable diseases: Secondary | ICD-10-CM | POA: Diagnosis not present

## 2019-06-07 DIAGNOSIS — I10 Essential (primary) hypertension: Secondary | ICD-10-CM | POA: Diagnosis not present

## 2019-06-07 DIAGNOSIS — I219 Acute myocardial infarction, unspecified: Secondary | ICD-10-CM | POA: Diagnosis present

## 2019-06-07 DIAGNOSIS — Z419 Encounter for procedure for purposes other than remedying health state, unspecified: Secondary | ICD-10-CM

## 2019-06-07 DIAGNOSIS — N529 Male erectile dysfunction, unspecified: Secondary | ICD-10-CM | POA: Diagnosis not present

## 2019-06-07 DIAGNOSIS — M1611 Unilateral primary osteoarthritis, right hip: Secondary | ICD-10-CM | POA: Diagnosis present

## 2019-06-07 DIAGNOSIS — Z471 Aftercare following joint replacement surgery: Secondary | ICD-10-CM | POA: Diagnosis not present

## 2019-06-07 DIAGNOSIS — Z96641 Presence of right artificial hip joint: Secondary | ICD-10-CM

## 2019-06-07 DIAGNOSIS — M5416 Radiculopathy, lumbar region: Secondary | ICD-10-CM | POA: Diagnosis present

## 2019-06-07 DIAGNOSIS — E785 Hyperlipidemia, unspecified: Secondary | ICD-10-CM | POA: Diagnosis not present

## 2019-06-07 HISTORY — PX: TOTAL HIP ARTHROPLASTY: SHX124

## 2019-06-07 LAB — CBC
HCT: 41.9 % (ref 39.0–52.0)
Hemoglobin: 13.4 g/dL (ref 13.0–17.0)
MCH: 26.8 pg (ref 26.0–34.0)
MCHC: 32 g/dL (ref 30.0–36.0)
MCV: 83.8 fL (ref 80.0–100.0)
Platelets: 180 10*3/uL (ref 150–400)
RBC: 5 MIL/uL (ref 4.22–5.81)
RDW: 13.8 % (ref 11.5–15.5)
WBC: 5.6 10*3/uL (ref 4.0–10.5)
nRBC: 0 % (ref 0.0–0.2)

## 2019-06-07 LAB — SARS CORONAVIRUS 2 BY RT PCR (HOSPITAL ORDER, PERFORMED IN ~~LOC~~ HOSPITAL LAB): SARS Coronavirus 2: NEGATIVE

## 2019-06-07 SURGERY — ARTHROPLASTY, HIP, TOTAL, ANTERIOR APPROACH
Anesthesia: Spinal | Site: Hip | Laterality: Right

## 2019-06-07 MED ORDER — CEFAZOLIN SODIUM 1 G IJ SOLR
INTRAMUSCULAR | Status: AC
  Filled 2019-06-07: qty 30

## 2019-06-07 MED ORDER — PANTOPRAZOLE SODIUM 40 MG PO TBEC
40.0000 mg | DELAYED_RELEASE_TABLET | Freq: Every day | ORAL | Status: DC
  Administered 2019-06-08 – 2019-06-09 (×2): 40 mg via ORAL
  Filled 2019-06-07 (×2): qty 1

## 2019-06-07 MED ORDER — ONDANSETRON HCL 4 MG/2ML IJ SOLN: 4.0000 mg | Freq: Four times a day (QID) | INTRAMUSCULAR | Status: DC | PRN

## 2019-06-07 MED ORDER — OXYCODONE HCL 5 MG PO TABS: 5.0000 mg | ORAL_TABLET | Freq: Once | ORAL | Status: DC | PRN

## 2019-06-07 MED ORDER — OXYCODONE HCL 5 MG PO TABS
10.0000 mg | ORAL_TABLET | ORAL | Status: DC | PRN
  Administered 2019-06-07 – 2019-06-08 (×3): 10 mg via ORAL
  Filled 2019-06-07 (×2): qty 2

## 2019-06-07 MED ORDER — MIDAZOLAM HCL 2 MG/2ML IJ SOLN
INTRAMUSCULAR | Status: DC | PRN
  Administered 2019-06-07: 2 mg via INTRAVENOUS

## 2019-06-07 MED ORDER — FENTANYL CITRATE (PF) 100 MCG/2ML IJ SOLN
25.0000 ug | INTRAMUSCULAR | Status: DC | PRN
  Administered 2019-06-07: 25 ug via INTRAVENOUS

## 2019-06-07 MED ORDER — PROPOFOL 10 MG/ML IV BOLUS
INTRAVENOUS | Status: AC
  Filled 2019-06-07: qty 20

## 2019-06-07 MED ORDER — ONDANSETRON HCL 4 MG/2ML IJ SOLN: 4.0000 mg | Freq: Once | INTRAMUSCULAR | Status: DC | PRN

## 2019-06-07 MED ORDER — HYDROMORPHONE HCL 1 MG/ML IJ SOLN
0.5000 mg | INTRAMUSCULAR | Status: DC | PRN
  Administered 2019-06-07: 15:00:00 1 mg via INTRAVENOUS
  Filled 2019-06-07: qty 1

## 2019-06-07 MED ORDER — DEXTROSE 5 % IV SOLN
INTRAVENOUS | Status: DC | PRN
  Administered 2019-06-07: 11:00:00 3 g via INTRAVENOUS

## 2019-06-07 MED ORDER — 0.9 % SODIUM CHLORIDE (POUR BTL) OPTIME
TOPICAL | Status: DC | PRN
  Administered 2019-06-07: 1000 mL

## 2019-06-07 MED ORDER — PHENOL 1.4 % MT LIQD: 1.0000 | OROMUCOSAL | Status: DC | PRN

## 2019-06-07 MED ORDER — TRANEXAMIC ACID-NACL 1000-0.7 MG/100ML-% IV SOLN
INTRAVENOUS | Status: DC | PRN
  Administered 2019-06-07: 1000 mg via INTRAVENOUS

## 2019-06-07 MED ORDER — METHOCARBAMOL 500 MG PO TABS
500.0000 mg | ORAL_TABLET | Freq: Four times a day (QID) | ORAL | Status: DC | PRN
  Administered 2019-06-07 – 2019-06-08 (×3): 500 mg via ORAL
  Filled 2019-06-07 (×4): qty 1

## 2019-06-07 MED ORDER — ACETAMINOPHEN 325 MG PO TABS
325.0000 mg | ORAL_TABLET | Freq: Four times a day (QID) | ORAL | Status: DC | PRN
  Administered 2019-06-08: 650 mg via ORAL
  Filled 2019-06-07: qty 2

## 2019-06-07 MED ORDER — POLYETHYLENE GLYCOL 3350 17 G PO PACK: 17.0000 g | PACK | Freq: Every day | ORAL | Status: DC | PRN

## 2019-06-07 MED ORDER — MIDAZOLAM HCL 2 MG/2ML IJ SOLN
INTRAMUSCULAR | Status: AC
  Filled 2019-06-07: qty 2

## 2019-06-07 MED ORDER — TRANEXAMIC ACID-NACL 1000-0.7 MG/100ML-% IV SOLN
INTRAVENOUS | Status: AC
  Filled 2019-06-07: qty 100

## 2019-06-07 MED ORDER — OXYCODONE HCL 5 MG PO TABS
5.0000 mg | ORAL_TABLET | ORAL | Status: DC | PRN
  Administered 2019-06-08 (×2): 10 mg via ORAL
  Filled 2019-06-07 (×3): qty 2

## 2019-06-07 MED ORDER — SODIUM CHLORIDE 0.9 % IR SOLN
Status: DC | PRN
  Administered 2019-06-07: 1000 mL

## 2019-06-07 MED ORDER — FENTANYL CITRATE (PF) 250 MCG/5ML IJ SOLN
INTRAMUSCULAR | Status: DC | PRN
  Administered 2019-06-07 (×2): 50 ug via INTRAVENOUS
  Administered 2019-06-07: 100 ug via INTRAVENOUS
  Administered 2019-06-07: 50 ug via INTRAVENOUS

## 2019-06-07 MED ORDER — ASPIRIN EC 325 MG PO TBEC: 325.0000 mg | DELAYED_RELEASE_TABLET | Freq: Two times a day (BID) | ORAL | Status: DC

## 2019-06-07 MED ORDER — ASPIRIN EC 325 MG PO TBEC
325.0000 mg | DELAYED_RELEASE_TABLET | Freq: Two times a day (BID) | ORAL | Status: DC
  Administered 2019-06-07 – 2019-06-09 (×4): 325 mg via ORAL
  Filled 2019-06-07 (×4): qty 1

## 2019-06-07 MED ORDER — SODIUM CHLORIDE 0.9 % IV SOLN
INTRAVENOUS | Status: DC | PRN
  Administered 2019-06-07: 11:00:00 30 ug/min via INTRAVENOUS

## 2019-06-07 MED ORDER — PHENYLEPHRINE 40 MCG/ML (10ML) SYRINGE FOR IV PUSH (FOR BLOOD PRESSURE SUPPORT)
PREFILLED_SYRINGE | INTRAVENOUS | Status: DC | PRN
  Administered 2019-06-07: 120 ug via INTRAVENOUS
  Administered 2019-06-07 (×2): 80 ug via INTRAVENOUS
  Administered 2019-06-07: 120 ug via INTRAVENOUS

## 2019-06-07 MED ORDER — SUGAMMADEX SODIUM 200 MG/2ML IV SOLN
INTRAVENOUS | Status: DC | PRN
  Administered 2019-06-07: 260 mg via INTRAVENOUS

## 2019-06-07 MED ORDER — CARVEDILOL 25 MG PO TABS
25.0000 mg | ORAL_TABLET | Freq: Two times a day (BID) | ORAL | Status: DC
  Administered 2019-06-07 – 2019-06-09 (×4): 25 mg via ORAL
  Filled 2019-06-07 (×4): qty 1

## 2019-06-07 MED ORDER — MENTHOL 3 MG MT LOZG: 1.0000 | LOZENGE | OROMUCOSAL | Status: DC | PRN

## 2019-06-07 MED ORDER — OXYCODONE HCL 5 MG/5ML PO SOLN: 5.0000 mg | Freq: Once | ORAL | Status: DC | PRN

## 2019-06-07 MED ORDER — FENTANYL CITRATE (PF) 250 MCG/5ML IJ SOLN
INTRAMUSCULAR | Status: AC
  Filled 2019-06-07: qty 5

## 2019-06-07 MED ORDER — GABAPENTIN 100 MG PO CAPS
100.0000 mg | ORAL_CAPSULE | Freq: Three times a day (TID) | ORAL | Status: DC
  Administered 2019-06-07 – 2019-06-09 (×6): 100 mg via ORAL
  Filled 2019-06-07 (×6): qty 1

## 2019-06-07 MED ORDER — SPIRONOLACTONE 25 MG PO TABS
25.0000 mg | ORAL_TABLET | Freq: Every day | ORAL | Status: DC
  Administered 2019-06-07 – 2019-06-09 (×3): 25 mg via ORAL
  Filled 2019-06-07 (×3): qty 1

## 2019-06-07 MED ORDER — CHLORHEXIDINE GLUCONATE 4 % EX LIQD: 60.0000 mL | Freq: Once | CUTANEOUS | Status: DC

## 2019-06-07 MED ORDER — FENTANYL CITRATE (PF) 100 MCG/2ML IJ SOLN
INTRAMUSCULAR | Status: AC
  Filled 2019-06-07: qty 2

## 2019-06-07 MED ORDER — FENTANYL CITRATE (PF) 100 MCG/2ML IJ SOLN
50.0000 ug | INTRAMUSCULAR | Status: AC | PRN
  Administered 2019-06-07 (×2): 50 ug via INTRAVENOUS

## 2019-06-07 MED ORDER — DOCUSATE SODIUM 100 MG PO CAPS
100.0000 mg | ORAL_CAPSULE | Freq: Two times a day (BID) | ORAL | Status: DC
  Administered 2019-06-07 – 2019-06-09 (×4): 100 mg via ORAL
  Filled 2019-06-07 (×4): qty 1

## 2019-06-07 MED ORDER — LACTATED RINGERS IV SOLN
INTRAVENOUS | Status: DC
  Administered 2019-06-07 (×2): via INTRAVENOUS

## 2019-06-07 MED ORDER — PROPOFOL 10 MG/ML IV BOLUS
INTRAVENOUS | Status: DC | PRN
  Administered 2019-06-07: 120 mg via INTRAVENOUS
  Administered 2019-06-07: 50 mg via INTRAVENOUS
  Administered 2019-06-07: 30 mg via INTRAVENOUS

## 2019-06-07 MED ORDER — SUCCINYLCHOLINE CHLORIDE 200 MG/10ML IV SOSY
PREFILLED_SYRINGE | INTRAVENOUS | Status: DC | PRN
  Administered 2019-06-07: 180 mg via INTRAVENOUS

## 2019-06-07 MED ORDER — FENTANYL CITRATE (PF) 100 MCG/2ML IJ SOLN
INTRAMUSCULAR | Status: AC
  Administered 2019-06-07: 08:00:00 50 ug via INTRAVENOUS
  Filled 2019-06-07: qty 2

## 2019-06-07 MED ORDER — MAGNESIUM GLUCONATE 500 MG PO TABS
500.0000 mg | ORAL_TABLET | ORAL | Status: DC
  Administered 2019-06-08: 09:00:00 500 mg via ORAL
  Filled 2019-06-07: qty 1

## 2019-06-07 MED ORDER — ZOLPIDEM TARTRATE 5 MG PO TABS: 5.0000 mg | ORAL_TABLET | Freq: Every evening | ORAL | Status: DC | PRN

## 2019-06-07 MED ORDER — POVIDONE-IODINE 10 % EX SWAB: 2.0000 "application " | Freq: Once | CUTANEOUS | Status: DC

## 2019-06-07 MED ORDER — ONDANSETRON HCL 4 MG PO TABS: 4.0000 mg | ORAL_TABLET | Freq: Four times a day (QID) | ORAL | Status: DC | PRN

## 2019-06-07 MED ORDER — METOCLOPRAMIDE HCL 5 MG/ML IJ SOLN: 5.0000 mg | Freq: Three times a day (TID) | INTRAMUSCULAR | Status: DC | PRN

## 2019-06-07 MED ORDER — SODIUM CHLORIDE (PF) 0.9 % IJ SOLN
INTRAMUSCULAR | Status: AC
  Filled 2019-06-07: qty 20

## 2019-06-07 MED ORDER — DEXAMETHASONE SODIUM PHOSPHATE 10 MG/ML IJ SOLN
INTRAMUSCULAR | Status: DC | PRN
  Administered 2019-06-07: 5 mg via INTRAVENOUS

## 2019-06-07 MED ORDER — ROCURONIUM BROMIDE 10 MG/ML (PF) SYRINGE
PREFILLED_SYRINGE | INTRAVENOUS | Status: DC | PRN
  Administered 2019-06-07: 60 mg via INTRAVENOUS

## 2019-06-07 MED ORDER — METOCLOPRAMIDE HCL 5 MG PO TABS: 5.0000 mg | ORAL_TABLET | Freq: Three times a day (TID) | ORAL | Status: DC | PRN

## 2019-06-07 MED ORDER — CEFAZOLIN SODIUM-DEXTROSE 2-4 GM/100ML-% IV SOLN
2.0000 g | Freq: Four times a day (QID) | INTRAVENOUS | Status: AC
  Administered 2019-06-07 (×2): 2 g via INTRAVENOUS
  Filled 2019-06-07 (×2): qty 100

## 2019-06-07 MED ORDER — ONDANSETRON HCL 4 MG/2ML IJ SOLN
INTRAMUSCULAR | Status: DC | PRN
  Administered 2019-06-07: 4 mg via INTRAVENOUS

## 2019-06-07 MED ORDER — METHOCARBAMOL 1000 MG/10ML IJ SOLN
500.0000 mg | Freq: Four times a day (QID) | INTRAVENOUS | Status: DC | PRN
  Filled 2019-06-07: qty 5

## 2019-06-07 MED ORDER — SODIUM CHLORIDE 0.9 % IV SOLN
INTRAVENOUS | Status: DC
  Administered 2019-06-07 – 2019-06-08 (×2): via INTRAVENOUS

## 2019-06-07 MED ORDER — ALUM & MAG HYDROXIDE-SIMETH 200-200-20 MG/5ML PO SUSP: 30.0000 mL | ORAL | Status: DC | PRN

## 2019-06-07 MED ORDER — DIPHENHYDRAMINE HCL 12.5 MG/5ML PO ELIX: 12.5000 mg | ORAL_SOLUTION | ORAL | Status: DC | PRN

## 2019-06-07 SURGICAL SUPPLY — 59 items
APL SKNCLS STERI-STRIP NONHPOA (GAUZE/BANDAGES/DRESSINGS)
ARTICULEZE HEAD (Hips) ×2 IMPLANT
BENZOIN TINCTURE PRP APPL 2/3 (GAUZE/BANDAGES/DRESSINGS) ×1 IMPLANT
BLADE CLIPPER SURG (BLADE) IMPLANT
BLADE SAW SGTL 18X1.27X75 (BLADE) ×3 IMPLANT
COVER SURGICAL LIGHT HANDLE (MISCELLANEOUS) ×2 IMPLANT
COVER WAND RF STERILE (DRAPES) ×2 IMPLANT
CUP ACET PNNCL SECTR W/GRIP 56 (Hips) IMPLANT
DRAPE C-ARM 42X72 X-RAY (DRAPES) ×2 IMPLANT
DRAPE STERI IOBAN 125X83 (DRAPES) ×2 IMPLANT
DRAPE U-SHAPE 47X51 STRL (DRAPES) ×6 IMPLANT
DRSG AQUACEL AG ADV 3.5X 6 (GAUZE/BANDAGES/DRESSINGS) ×1 IMPLANT
DRSG AQUACEL AG ADV 3.5X10 (GAUZE/BANDAGES/DRESSINGS) ×1 IMPLANT
DRSG XEROFORM 1X8 (GAUZE/BANDAGES/DRESSINGS) ×1 IMPLANT
DURAPREP 26ML APPLICATOR (WOUND CARE) ×2 IMPLANT
ELECT BLADE 4.0 EZ CLEAN MEGAD (MISCELLANEOUS) ×2
ELECT BLADE 6.5 EXT (BLADE) ×1 IMPLANT
ELECT REM PT RETURN 9FT ADLT (ELECTROSURGICAL) ×2
ELECTRODE BLDE 4.0 EZ CLN MEGD (MISCELLANEOUS) ×1 IMPLANT
ELECTRODE REM PT RTRN 9FT ADLT (ELECTROSURGICAL) ×1 IMPLANT
FACESHIELD WRAPAROUND (MASK) ×6 IMPLANT
FACESHIELD WRAPAROUND OR TEAM (MASK) ×2 IMPLANT
GLOVE BIOGEL PI IND STRL 8 (GLOVE) ×2 IMPLANT
GLOVE BIOGEL PI INDICATOR 8 (GLOVE) ×2
GLOVE ECLIPSE 8.0 STRL XLNG CF (GLOVE) ×2 IMPLANT
GLOVE ORTHO TXT STRL SZ7.5 (GLOVE) ×4 IMPLANT
GOWN STRL REUS W/ TWL LRG LVL3 (GOWN DISPOSABLE) ×2 IMPLANT
GOWN STRL REUS W/ TWL XL LVL3 (GOWN DISPOSABLE) ×2 IMPLANT
GOWN STRL REUS W/TWL LRG LVL3 (GOWN DISPOSABLE) ×6
GOWN STRL REUS W/TWL XL LVL3 (GOWN DISPOSABLE) ×4
HANDPIECE INTERPULSE COAX TIP (DISPOSABLE) ×2
HEAD ARTICULEZE (Hips) IMPLANT
KIT BASIN OR (CUSTOM PROCEDURE TRAY) ×2 IMPLANT
KIT TURNOVER KIT B (KITS) ×2 IMPLANT
LINER NEUTRAL 52MMX36MMX56N (Liner) ×1 IMPLANT
MANIFOLD NEPTUNE II (INSTRUMENTS) ×2 IMPLANT
NS IRRIG 1000ML POUR BTL (IV SOLUTION) ×2 IMPLANT
PACK TOTAL JOINT (CUSTOM PROCEDURE TRAY) ×2 IMPLANT
PAD ARMBOARD 7.5X6 YLW CONV (MISCELLANEOUS) ×2 IMPLANT
PINN SECTOR W/GRIP ACE CUP 56 (Hips) ×2 IMPLANT
SET HNDPC FAN SPRY TIP SCT (DISPOSABLE) ×1 IMPLANT
SPONGE LAP 18X18 RF (DISPOSABLE) ×2 IMPLANT
STAPLER VISISTAT 35W (STAPLE) ×1 IMPLANT
STEM FEMORAL SZ5 HIGH ACTIS (Stem) ×1 IMPLANT
STRIP CLOSURE SKIN 1/2X4 (GAUZE/BANDAGES/DRESSINGS) ×2 IMPLANT
SUT ETHIBOND NAB CT1 #1 30IN (SUTURE) ×2 IMPLANT
SUT MNCRL AB 4-0 PS2 18 (SUTURE) ×1 IMPLANT
SUT VIC AB 0 CT1 27 (SUTURE) ×4
SUT VIC AB 0 CT1 27XBRD ANBCTR (SUTURE) ×1 IMPLANT
SUT VIC AB 1 CT1 27 (SUTURE) ×4
SUT VIC AB 1 CT1 27XBRD ANBCTR (SUTURE) ×1 IMPLANT
SUT VIC AB 2-0 CT1 27 (SUTURE) ×4
SUT VIC AB 2-0 CT1 TAPERPNT 27 (SUTURE) ×1 IMPLANT
TOWEL GREEN STERILE (TOWEL DISPOSABLE) ×2 IMPLANT
TOWEL GREEN STERILE FF (TOWEL DISPOSABLE) ×2 IMPLANT
TRAY CATH 16FR W/PLASTIC CATH (SET/KITS/TRAYS/PACK) IMPLANT
TRAY FOLEY W/BAG SLVR 16FR (SET/KITS/TRAYS/PACK)
TRAY FOLEY W/BAG SLVR 16FR ST (SET/KITS/TRAYS/PACK) IMPLANT
WATER STERILE IRR 1000ML POUR (IV SOLUTION) ×3 IMPLANT

## 2019-06-07 NOTE — Transfer of Care (Signed)
Immediate Anesthesia Transfer of Care Note  Patient: Russell Garcia  Procedure(s) Performed: RIGHT TOTAL HIP ARTHROPLASTY ANTERIOR APPROACH (Right Hip)  Patient Location: PACU  Anesthesia Type:General  Level of Consciousness: drowsy  Airway & Oxygen Therapy: Patient Spontanous Breathing and Patient connected to face mask oxygen  Post-op Assessment: Report given to RN and Post -op Vital signs reviewed and stable  Post vital signs: Reviewed and stable  Last Vitals:  Vitals Value Taken Time  BP 121/62 06/07/19 1231  Temp    Pulse 63 06/07/19 1232  Resp 13 06/07/19 1232  SpO2 96 % 06/07/19 1232  Vitals shown include unvalidated device data.  Last Pain:  Vitals:   06/07/19 1230  TempSrc:   PainSc: Asleep      Patients Stated Pain Goal: 3 (61/48/30 7354)  Complications: No apparent anesthesia complications

## 2019-06-07 NOTE — Op Note (Signed)
NAME: Hackley, Timon J. MEDICAL RECORD EX:[id number] ACCOUNT [id number] DATE OF BIRTH:[birth year] FACILITY: [facility] LOCATION: [facility] PHYSICIAN:Yicel Shannon Kerry Fort, MD  OPERATIVE REPORT  DATE OF PROCEDURE:  06/07/2019  PREOPERATIVE DIAGNOSIS:  Primary osteoarthritis and degenerative joint disease, right hip.  POSTOPERATIVE DIAGNOSIS:  Primary osteoarthritis and degenerative joint disease, right hip.  PROCEDURE:  Right total hip arthroplasty through direct anterior approach.  IMPLANTS:  DePuy Sector Gription acetabular component size 56, size 36+0 polyethylene liner, size 5 ACTIS femoral component with high offset, size 36+5 metal hip ball.  SURGEON:  Lind Guest. Ninfa Linden, MD  ASSISTANT:  Erskine Emery, PA-C  ANESTHESIA: 1.  Attempted spinal. 2.  General.  ANTIBIOTICS:  3 g IV Ancef.  ESTIMATED BLOOD LOSS:  150 mL.  COMPLICATIONS:  None.  INDICATIONS:  The patient is a 63 year old individual with severe debilitating arthritis involving his right hip.  This is well documented on x-rays for over 5 years now.  His hip shows complete loss of joint space.  His pain is daily, and it is  detrimentally affecting his mobility, his quality of life, and his activities of daily living.  He is someone who is morbidly obese with a BMI of 39.  He also has a below-knee amputation on the same side of his right hip arthritis.  He does have a  prosthetic leg.  He was sent to me to consider anterior hip surgery.  When I saw him in the office, we examined his leg with his prosthesis on to see if we could still pull traction through the leg with the prosthetic leg on to facilitate anterior hip  surgery.  We were able to keep the prosthesis on, so I felt comfortable with proceeding with the surgery.  We had a long and thorough discussion about the risks and benefits of surgery.  We had counseling before due to high blood pressure issues, and he  is working on better hypertension control.  We talked  about the risk of acute blood loss anemia, nerve and vessel injury, fracture, infection, dislocation, DVT.  He understands this will be certainly difficult to assess the stability given his prosthetic  leg, but we would do our best to make sure it is a stable hip.  He understands our goals are to decrease pain, improve mobility, and overall improve quality of life.  DESCRIPTION OF PROCEDURE:  After informed consent was obtained and appropriate right hip was marked, he was brought to the operating room and sat up on a stretcher where they attempted spinal anesthesia.  They were unsuccessful, so we laid him in supine  position on the stretcher, and general anesthesia was then obtained.  We then placed traction boots on both his feet and placed him supine on the Hana fracture table with the perineal post in place and both legs in in-line skeletal traction device and no  traction applied.  We again assessed his prosthesis to make sure it was secure on his leg and even overwrapped it with Coban at the knee and over the traction boot.  We then prepped his right operative hip with DuraPrep and sterile drapes.  A time-out  was called, and he was identified as correct patient, correct right hip.  We then made an incision just inferior and posterior to the anterior superior iliac spine and carried this obliquely down the leg.  We dissected down tensor fascia lata muscle.   Tensor fascia was then divided longitudinally to proceed with a direct anterior approach to the hip.  We identified and cauterized circumflex vessels and identified the hip capsule, opening it up in an L-type format, finding a large joint effusion.  We  found significant periarticular osteophytes around the femoral head and neck.  We placed curved retractors around the medial and lateral femoral neck and then made our femoral neck cut with an oscillating saw just proximal to the lesser trochanter.  We  completed this with an osteotome.  We placed a  corkscrew guide in the femoral head and removed the femoral head in its entirety and found a wide area devoid of cartilage.  We then placed a bent Hohmann over the medial acetabular rim and removed  periarticular osteophytes and other debris including the acetabular labrum.  We then began reaming under direct visualization from a size 44 reamer in stepwise increments up to a size 55 with all reamers under direct visualization, the last 2 reamers  under direct fluoroscopy so we could obtain our depth of reaming, our inclination and anteversion.  Once I was pleased with that, I placed the real DePuy Sector Gription acetabular component size 56 and a 36+0 neutral polyethylene liner.  Attention was  then turned to the femur.  With the leg externally rotated to 120 degrees, extended and adducted, we were able to place a Mueller retractor medially and a Hohmann retractor behind the greater trochanter.  We released lateral joint capsule and used a  box-cutting osteotome to enter the femoral canal and a rongeur to lateralize.  We then began broaching using the ACTIS broaching system from a size 0 up to a size 5.  With the size 5 in place, we tried a high-offset femoral neck and a 32+1 hip ball,  reduced this in the acetabulum, and we felt it was stable, but I felt like given the fact that he does have a prosthesis, we should go with even a tighter field and more leg length.  We dislocated the hip and removed the trial components.  We placed the  real ACTIS femoral component, then size 5 with high offset, and we went with a 36+5 metal hip ball.  We reduced this in the acetabulum, and I did appreciate the stability.  We then irrigated the soft tissue with normal saline solution.  We did not close  any type of joint capsule but did close the tensor fascia with interrupted #1 Vicryl suture followed by 0 Vicryl in the deep tissue, 2-0 Vicryl to close the subcutaneous tissue, and interrupted staples on the skin.  Xeroform  and an Aquacel dressing were  applied.  He was taken off of the Hana table, awakened, extubated, and taken to recovery room in stable condition.  All final counts were correct.  There were no complications noted.  Of note, Benita Stabile, PA-C, assisted the entire case.  His assistance  was crucial for facilitating all aspects of this case.  LN/NUANCE  D:06/07/2019 T:06/07/2019 JOB:[id number]

## 2019-06-07 NOTE — H&P (Signed)
TOTAL HIP ADMISSION H&P  Patient is admitted for right total hip arthroplasty.  Subjective:  Chief Complaint: right hip pain  HPI: Russell Garcia, 63 y.o. male, has a history of pain and functional disability in the right hip(s) due to arthritis and patient has failed non-surgical conservative treatments for greater than 12 weeks to include NSAID's and/or analgesics, corticosteriod injections, flexibility and strengthening excercises, supervised PT with diminished ADL's post treatment, use of assistive devices, weight reduction as appropriate and activity modification.  Onset of symptoms was gradual starting 3 years ago with gradually worsening course since that time.The patient noted no past surgery on the right hip(s).  Patient currently rates pain in the right hip at 10 out of 10 with activity. Patient has night pain, worsening of pain with activity and weight bearing, trendelenberg gait, pain that interfers with activities of daily living, pain with passive range of motion and crepitus. Patient has evidence of subchondral cysts, subchondral sclerosis, periarticular osteophytes and joint space narrowing by imaging studies. This condition presents safety issues increasing the risk of falls.  There is no current active infection.  Patient Active Problem List   Diagnosis Date Noted  . Unilateral primary osteoarthritis, right hip 03/15/2019  . Right hip pain 05/14/2016  . Lumbar radiculopathy, chronic 05/14/2016  . Gross hematuria 09/22/2012  . Dermatitis 03/09/2011  . Hyperlipidemia 03/09/2011  . NUMBNESS 05/16/2010  . 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 Essential hypertension 07/31/2008  . Morbid obesity ([place]) 08/22/2007  . HERNIA, HX OF 08/16/2007  . Status post below knee amputation ([place]) 08/16/2007   Past Medical History:  Diagnosis Date  . Anxiety   . Arthritis   . Depression   . Hx of leg amputation ([place])    lost due to a birth defect R (BKA)  . Hypertension 2009     Past Surgical History:  Procedure Laterality Date  . INGUINAL HERNIA REPAIR    . R BKA      Current Facility-Administered Medications  Medication Dose Route Frequency Provider Last Rate Last Dose  . lactated ringers infusion   Intravenous Continuous Roberts Gaudy, MD 10 mL/hr at 06/07/19 0809     No Known Allergies  Social History   Tobacco Use  . Smoking status: Never Smoker  . Smokeless tobacco: Never Used  Substance Use Topics  . Alcohol use: No    Alcohol/week: 0.0 standard drinks    Family History  Problem Relation Age of Onset  . Leukemia Father   . Heart attack Mother 23  . Diabetes Neg Hx   . Colon cancer Neg Hx   . Prostate cancer Neg Hx      Review of Systems  Musculoskeletal: Positive for joint pain.  All other systems reviewed and are negative.   Objective:  Physical Exam  Constitutional: He is oriented to person, place, and time. He appears well-developed and well-nourished.  HENT:  Head: Normocephalic and atraumatic.  Eyes: Pupils are equal, round, and reactive to light. EOM are normal.  Neck: Normal range of motion.  Cardiovascular: Normal rate.  Respiratory: Effort normal.  GI: Soft.  Musculoskeletal:     Right hip: He exhibits decreased range of motion, decreased strength, tenderness and bony tenderness.  Neurological: He is alert and oriented to person, place, and time.  Skin: Skin is warm and dry.  Psychiatric: He has a normal mood and affect.    Vital signs in last 24 hours: Temp:  [98.2 F (36.8 C)] 98.2 F (36.8 C) (08/13  4967) Pulse Rate:  [52-84] 52 (08/13 0819) Resp:  [18] 18 (08/13 0717) BP: (142-159)/(99-102) 142/99 (08/13 0750) SpO2:  [95 %-99 %] 95 % (08/13 0819) Weight:  [130.6 kg] 130.6 kg (08/13 0717)  Labs:   Estimated body mass index is 39.06 kg/m as calculated from the following:   Height as of this encounter: 6' (1.829 m).   Weight as of this encounter: 130.6 kg.   Imaging Review Plain radiographs demonstrate  severe degenerative joint disease of the right hip(s). The bone quality appears to be good for age and reported activity level.      Assessment/Plan:  End stage arthritis, right hip(s)  The patient history, physical examination, clinical judgement of the provider and imaging studies are consistent with end stage degenerative joint disease of the right hip(s) and total hip arthroplasty is deemed medically necessary. The treatment options including medical management, injection therapy, arthroscopy and arthroplasty were discussed at length. The risks and benefits of total hip arthroplasty were presented and reviewed. The risks due to aseptic loosening, infection, stiffness, dislocation/subluxation,  thromboembolic complications and other imponderables were discussed.  The patient acknowledged the explanation, agreed to proceed with the plan and consent was signed. Patient is being admitted for inpatient treatment for surgery, pain control, PT, OT, prophylactic antibiotics, VTE prophylaxis, progressive ambulation and ADL's and discharge planning.The patient is planning to be discharged home with home health services    Patient's anticipated LOS is less than 2 midnights, meeting these requirements: - Younger than 91 - Lives within 1 hour of care - Has a competent adult at home to recover with post-op recover - NO history of  - Chronic pain requiring opiods  - Diabetes  - Coronary Artery Disease  - Heart failure  - Heart attack  - Stroke  - DVT/VTE  - Cardiac arrhythmia  - Respiratory Failure/COPD  - Renal failure  - Anemia  - Advanced Liver disease

## 2019-06-07 NOTE — Brief Op Note (Signed)
06/07/2019  12:09 PM  PATIENT:  Russell Garcia  63 y.o. male  PRE-OPERATIVE DIAGNOSIS:  osteoarthritis right hip  POST-OPERATIVE DIAGNOSIS:  osteoarthritis right hip  PROCEDURE:  Procedure(s): RIGHT TOTAL HIP ARTHROPLASTY ANTERIOR APPROACH (Right)  SURGEON:  Surgeon(s) and Role:    Mcarthur Rossetti, MD - Primary  PHYSICIAN ASSISTANT: Benita Stabile, PA-C  ANESTHESIA:   spinal and general  EBL:  150 mL   COUNTS:  YES  PLAN OF CARE: Admit to inpatient   PATIENT DISPOSITION:  PACU - hemodynamically stable.   Delay start of Pharmacological VTE agent (>24hrs) due to surgical blood loss or risk of bleeding: no

## 2019-06-07 NOTE — Anesthesia Postprocedure Evaluation (Signed)
Anesthesia Post Note  Patient: Russell Garcia  Procedure(s) Performed: RIGHT TOTAL HIP ARTHROPLASTY ANTERIOR APPROACH (Right Hip)     Patient location during evaluation: PACU Anesthesia Type: Spinal Level of consciousness: awake and alert Pain management: pain level controlled Vital Signs Assessment: post-procedure vital signs reviewed and stable Respiratory status: spontaneous breathing, nonlabored ventilation, respiratory function stable and patient connected to nasal cannula oxygen Cardiovascular status: blood pressure returned to baseline and stable Postop Assessment: no apparent nausea or vomiting Anesthetic complications: no    Last Vitals:  Vitals:   06/07/19 1330 06/07/19 1350  BP: (!) 132/97 (!) 141/96  Pulse: (!) 55 63  Resp: 14 16  Temp: (!) 36.2 C (!) 36.4 C  SpO2: 94% 93%    Last Pain:  Vitals:   06/07/19 1404  TempSrc:   PainSc: Asleep                 Amilia Vandenbrink COKER

## 2019-06-07 NOTE — Anesthesia Procedure Notes (Signed)
Procedure Name: Intubation Date/Time: 06/07/2019 10:48 AM Performed by: Imagene Riches, CRNA Pre-anesthesia Checklist: Patient identified, Emergency Drugs available, Suction available and Patient being monitored Patient Re-evaluated:Patient Re-evaluated prior to induction Oxygen Delivery Method: Circle System Utilized Preoxygenation: Pre-oxygenation with 100% oxygen Induction Type: IV induction Ventilation: Mask ventilation without difficulty Laryngoscope Size: Miller and 3 Grade View: Grade II Tube type: Oral Tube size: 7.5 mm Number of attempts: 1 Airway Equipment and Method: Stylet and Oral airway Placement Confirmation: ETT inserted through vocal cords under direct vision,  positive ETCO2 and breath sounds checked- equal and bilateral Secured at: 23 cm Tube secured with: Tape Dental Injury: Teeth and Oropharynx as per pre-operative assessment

## 2019-06-07 NOTE — Anesthesia Preprocedure Evaluation (Signed)
Anesthesia Evaluation  Patient identified by MRN, date of birth, ID band Patient awake    Reviewed: Allergy & Precautions, NPO status , Patient's Chart, lab work & pertinent test results  Airway Mallampati: II  TM Distance: >3 FB Neck ROM: Full    Dental  (+) Teeth Intact, Dental Advisory Given   Pulmonary    breath sounds clear to auscultation       Cardiovascular hypertension,  Rhythm:Regular Rate:Normal     Neuro/Psych    GI/Hepatic   Endo/Other    Renal/GU      Musculoskeletal   Abdominal (+) + obese,   Peds  Hematology   Anesthesia Other Findings   Reproductive/Obstetrics                             Anesthesia Physical Anesthesia Plan  ASA: III  Anesthesia Plan: Spinal   Post-op Pain Management:    Induction: Intravenous  PONV Risk Score and Plan: Ondansetron, Dexamethasone and Propofol infusion  Airway Management Planned:   Additional Equipment:   Intra-op Plan:   Post-operative Plan:   Informed Consent: I have reviewed the patients History and Physical, chart, labs and discussed the procedure including the risks, benefits and alternatives for the proposed anesthesia with the patient or authorized representative who has indicated his/her understanding and acceptance.     Dental advisory given  Plan Discussed with: CRNA and Anesthesiologist  Anesthesia Plan Comments:         Anesthesia Quick Evaluation

## 2019-06-07 NOTE — Plan of Care (Addendum)
Patient arrived on unit sleepy, he is resting, vitals WNL, no pain noted at this time.

## 2019-06-07 NOTE — Telephone Encounter (Signed)
Patient was in office 06-06-2019 for BP check. BP was 146/96, per Dr. Georgina Snell pt is to continue with all BP meds and is ok for surgery tomorrow if BP remains ok.

## 2019-06-07 NOTE — Anesthesia Procedure Notes (Signed)
Anesthesia Procedure Note Attempted spinal with 24 G Pencan and 22G Tuohy needles at L3-4 and L2-3 without success. No blood noted in needles, small amount of CSF flow. Decision made to proceed to general anesthesia.  Roberts Gaudy

## 2019-06-07 NOTE — Evaluation (Signed)
Physical Therapy Evaluation Patient Details Name: Russell Garcia MRN: [id number] DOB: 08/02/1956 Today's Date: 06/07/2019   History of Present Illness  Pt is a 63 year old male presenting s/p R THA direct anterior approach. PMH includes HTN, hyperlipidemia, anxiety/depression, R BKA, and lumbar radiculopathy.  Clinical Impression  Pt admitted with the above problem and below deficits. Pt very lethargic at beginning of session limiting tolerance, however, improved alertness noted with mobility tasks. Pt able to perform mobility tasks at the min G level with use of RW. Reviewed supine HEP with Pt. Pt will benefit from skilled acute physical therapy to improve functional mobility independence and safety.    Follow Up Recommendations Follow surgeon's recommendation for DC plan and follow-up therapies;Supervision for mobility/OOB    Equipment Recommendations  Rolling walker with 5" wheels;3in1 (PT)    Recommendations for Other Services       Precautions / Restrictions Precautions Precautions: Other (comment) Precaution Comments: R BKA Required Braces or Orthoses: Other Brace Other Brace: R BKA prosthetic Restrictions Weight Bearing Restrictions: Yes RLE Weight Bearing: Weight bearing as tolerated      Mobility  Bed Mobility Overal bed mobility: Needs Assistance Bed Mobility: Supine to Sit     Supine to sit: Supervision     General bed mobility comments: supervision for safety  Transfers Overall transfer level: Needs assistance Equipment used: Rolling walker (2 wheeled) Transfers: Sit to/from [facility] Sit to Stand: Min guard Stand pivot transfers: Min guard       General transfer comment: Pt required cues for hand placement. Min G assist for transfer safety. Pt reported dizziness with position changes and mobility limited.  Ambulation/Gait                Stairs            Wheelchair Mobility    Modified Rankin (Stroke Patients Only)        Balance Overall balance assessment: Needs assistance Sitting-balance support: No upper extremity supported;Feet supported Sitting balance-Leahy Scale: Fair     Standing balance support: Bilateral upper extremity supported;During functional activity Standing balance-Leahy Scale: Poor Standing balance comment: reliant on RW                             Pertinent Vitals/Pain Pain Assessment: Faces Faces Pain Scale: Hurts even more Pain Location: R hip Pain Descriptors / Indicators: Sharp;Aching Pain Intervention(s): Limited activity within patient's tolerance;Monitored during session    [place] expects to be discharged to:: Private residence Living Arrangements: Spouse/significant other Available Help at Discharge: Family;Available PRN/intermittently Type of Home: House Home Access: Stairs to enter Entrance Stairs-Rails: None Entrance Stairs-Number of Steps: 3 Home Layout: One level Home Equipment: None      Prior Function Level of Independence: Independent         Comments: Pt ambulated with R BKA prosthetic.     Hand Dominance        Extremity/Trunk Assessment   Upper Extremity Assessment Upper Extremity Assessment: Overall WFL for tasks assessed    Lower Extremity Assessment Lower Extremity Assessment: RLE deficits/detail;Generalized weakness RLE Deficits / Details: deficits consistent with post op pain and weakness and hx of R BKA RLE: Unable to fully assess due to pain    Cervical / Trunk Assessment Cervical / Trunk Assessment: Normal  Communication   Communication: No difficulties  Cognition Arousal/Alertness: Lethargic;Suspect due to medications Behavior During Therapy: Flat affect Overall Cognitive Status: Within Functional Limits  for tasks assessed                                 General Comments: Pt lethargic following R THA suspected due to pain medications. Increased alertness noted with  change in position.      General Comments General comments (skin integrity, edema, etc.): educated pt on precautions and ther ex    Exercises Total Joint Exercises Ankle Circles/Pumps: Left;20 reps;Supine;AROM Quad Sets: Right;10 reps;AROM;Supine Heel Slides: AROM;Right;10 reps;Supine   Assessment/Plan    PT Assessment Patient needs continued PT services  PT Problem List Decreased strength;Decreased range of motion;Decreased activity tolerance;Decreased balance;Decreased mobility;Decreased knowledge of use of DME;Decreased knowledge of precautions;Obesity;Pain       PT Treatment Interventions DME instruction;Gait training;Stair training;Functional mobility training;Therapeutic activities;Therapeutic exercise;Balance training;Patient/family education    PT Goals (Current goals can be found in the Care Plan section)  Acute Rehab PT Goals Patient Stated Goal: walk with less pain PT Goal Formulation: With patient Time For Goal Achievement: 06/21/19 Potential to Achieve Goals: Good    Frequency 7X/week   Barriers to discharge Decreased caregiver support      Co-evaluation               AM-PAC PT "6 Clicks" Mobility  Outcome Measure Help needed turning from your back to your side while in a flat bed without using bedrails?: None Help needed moving from lying on your back to sitting on the side of a flat bed without using bedrails?: None Help needed moving to and from a bed to a chair (including a wheelchair)?: A Little Help needed standing up from a chair using your arms (e.g., wheelchair or bedside chair)?: A Little Help needed to walk in hospital room?: A Lot Help needed climbing 3-5 steps with a railing? : A Lot 6 Click Score: 18    End of Session Equipment Utilized During Treatment: Gait belt Activity Tolerance: Patient limited by lethargy;Patient limited by pain;Treatment limited secondary to medical complications (Comment)(Pt reporting dizziness) Patient left:  in chair;with call bell/phone within reach Nurse Communication: Mobility status(Pt reports of dizziness with change of position) PT Visit Diagnosis: Unsteadiness on feet (R26.81);Muscle weakness (generalized) (M62.81);Difficulty in walking, not elsewhere classified (R26.2);Pain Pain - Right/Left: Right Pain - part of body: Hip    Time: 9480-1655 PT Time Calculation (min) (ACUTE ONLY): 25 min   Charges:   PT Evaluation $PT Eval Low Complexity: 1 Low PT Treatments $Therapeutic Activity: 8-22 mins        Christophe Louis, SPT  Christophe Louis 06/07/2019, 6:38 PM

## 2019-06-08 ENCOUNTER — Encounter (HOSPITAL_COMMUNITY): Payer: Self-pay | Admitting: General Practice

## 2019-06-08 DIAGNOSIS — M5416 Radiculopathy, lumbar region: Secondary | ICD-10-CM | POA: Diagnosis not present

## 2019-06-08 DIAGNOSIS — Z89511 Acquired absence of right leg below knee: Secondary | ICD-10-CM | POA: Diagnosis not present

## 2019-06-08 DIAGNOSIS — Z96641 Presence of right artificial hip joint: Secondary | ICD-10-CM | POA: Diagnosis not present

## 2019-06-08 DIAGNOSIS — M1611 Unilateral primary osteoarthritis, right hip: Secondary | ICD-10-CM | POA: Diagnosis not present

## 2019-06-08 DIAGNOSIS — Z6839 Body mass index (BMI) 39.0-39.9, adult: Secondary | ICD-10-CM | POA: Diagnosis not present

## 2019-06-08 DIAGNOSIS — Z20828 Contact with and (suspected) exposure to other viral communicable diseases: Secondary | ICD-10-CM | POA: Diagnosis not present

## 2019-06-08 DIAGNOSIS — I219 Acute myocardial infarction, unspecified: Secondary | ICD-10-CM | POA: Diagnosis not present

## 2019-06-08 LAB — BASIC METABOLIC PANEL
Anion gap: 11 (ref 5–15)
BUN: 20 mg/dL (ref 8–23)
CO2: 26 mmol/L (ref 22–32)
Calcium: 8.8 mg/dL — ABNORMAL LOW (ref 8.9–10.3)
Chloride: 100 mmol/L (ref 98–111)
Creatinine, Ser: 1.7 mg/dL — ABNORMAL HIGH (ref 0.61–1.24)
GFR calc Af Amer: 49 mL/min — ABNORMAL LOW (ref >60)
GFR calc non Af Amer: 42 mL/min — ABNORMAL LOW (ref >60)
Glucose, Bld: 130 mg/dL — ABNORMAL HIGH (ref 70–99)
Potassium: 4.3 mmol/L (ref 3.5–5.1)
Sodium: 137 mmol/L (ref 135–145)

## 2019-06-08 LAB — CBC
HCT: 36.5 % — ABNORMAL LOW (ref 39.0–52.0)
Hemoglobin: 11.5 g/dL — ABNORMAL LOW (ref 13.0–17.0)
MCH: 26.6 pg (ref 26.0–34.0)
MCHC: 31.5 g/dL (ref 30.0–36.0)
MCV: 84.5 fL (ref 80.0–100.0)
Platelets: 153 10*3/uL (ref 150–400)
RBC: 4.32 MIL/uL (ref 4.22–5.81)
RDW: 13.6 % (ref 11.5–15.5)
WBC: 10.6 10*3/uL — ABNORMAL HIGH (ref 4.0–10.5)
nRBC: 0 % (ref 0.0–0.2)

## 2019-06-08 NOTE — TOC Initial Note (Addendum)
Transition of Care [facility]) - Initial/Assessment Note    Patient Details  Name: Russell Garcia MRN: [id number] Date of Birth: [birth year]  [facility]) CM/SW Contact:    Marilu Favre, RN Phone Number: 06/08/2019, 11:39 AM  Clinical Narrative:                 Patient from home with friend. Confirmed face sheet information.   Patient needs 3 in 1 and walker, ordered through [place] spoke to [place] , he will have DME delivered to room today.   [facility] unable to accept referral due to being out  of network with patients insurance, continuing to find home health services, patient has no preference.   [facility] has accepted referral for home health.  Expected Discharge Plan: [place] Barriers to Discharge: Continued Medical Work up   Patient Goals and CMS Choice Patient states their goals for this hospitalization and ongoing recovery are:: to go home CMS Medicare.gov Compare Post Acute Care list provided to:: Patient Choice offered to / list presented to : Patient  Expected Discharge Plan and Services Expected Discharge Plan: [place]   Discharge Planning Services: CM Consult Post Acute Care Choice: Durable Medical Equipment, Home Health Living arrangements for the past 2 months: Single Family Home                 DME Arranged: Gilford Rile, 3-N-1 DME Agency: [employer] Date DME Agency Contacted: 06/08/19 Time DME Agency Contacted: [phone] Representative spoke with at DME Agency: [place]: PT [place]: [facility] (now [facility]) Date [place]: 06/08/19 Time [place]: 46 Representative spoke with at [place]: [place] Arrangements/Services Living arrangements for the past 2 months: [place] Lives with:: Spouse Patient language and need for interpreter reviewed:: Yes Do you feel safe going back to the place where you live?: Yes      Need for Family  Participation in Patient Care: Yes (Comment) Care giver support system in place?: Yes (comment)   Criminal Activity/Legal Involvement Pertinent to Current Situation/Hospitalization: No - Comment as needed  Activities of Daily Living Home Assistive Devices/Equipment: Other (Comment)(PROSTETIC LEG) ADL Screening (condition at time of admission) Patient's cognitive ability adequate to safely complete daily activities?: Yes Is the patient deaf or have difficulty hearing?: No Does the patient have difficulty seeing, even when wearing glasses/contacts?: No Does the patient have difficulty concentrating, remembering, or making decisions?: No Patient able to express need for assistance with ADLs?: Yes Does the patient have difficulty dressing or bathing?: No Independently performs ADLs?: Yes (appropriate for developmental age) Does the patient have difficulty walking or climbing stairs?: Yes Weakness of Legs: Left(RIGHT BKA) Weakness of Arms/Hands: None  Permission Sought/Granted   Permission granted to share information with : Yes, Verbal Permission Granted     Permission granted to share info w AGENCY: [facility]        Emotional Assessment Appearance:: Appears stated age Attitude/Demeanor/Rapport: Engaged Affect (typically observed): Accepting Orientation: : Oriented to Self, Oriented to Place, Oriented to  Time, Oriented to Situation Alcohol / Substance Use: Not Applicable Psych Involvement: No (comment)  Admission diagnosis:  osteoarthritis right hip Patient Active Problem List   Diagnosis Date Noted  . Status post total replacement of right hip 06/07/2019  . Unilateral primary osteoarthritis, right hip 03/15/2019  . Right hip pain 05/14/2016  . Lumbar radiculopathy, chronic 05/14/2016  .  Gross hematuria 09/22/2012  . Dermatitis 03/09/2011  . Hyperlipidemia 03/09/2011  . NUMBNESS 05/16/2010  . 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 Essential hypertension  07/31/2008  . Morbid obesity ([place]) 08/22/2007  . HERNIA, HX OF 08/16/2007  . Status post below knee amputation ([place]) 08/16/2007   PCP:  Gregor Hams, MD Pharmacy:   [id number] [place] [address] [place] [address] Phone: [phone] Fax: [phone]     Social Determinants of Health (SDOH) Interventions    Readmission Risk Interventions No flowsheet data found.

## 2019-06-08 NOTE — Plan of Care (Signed)
  Problem: Coping: Goal: Level of anxiety will decrease Outcome: Progressing   Problem: Nutrition: Goal: Adequate nutrition will be maintained Outcome: Progressing   Problem: Safety: Goal: Ability to remain free from injury will improve Outcome: Progressing   

## 2019-06-08 NOTE — Progress Notes (Signed)
Physical Therapy Treatment Patient Details Name: Russell Garcia MRN: [id number] DOB: 1956-01-02 Today's Date: 06/08/2019    History of Present Illness Pt is a 63 year old male presenting s/p R THA direct anterior approach. PMH includes HTN, hyperlipidemia, anxiety/depression, R BKA, and lumbar radiculopathy.    PT Comments    Pt performed gt training and functional mobility with min guard assistance.  He complains of minor dizziness in standing but this does not impact his progression of mobility.  Will fullow up in pm for stair training and progression to standing exercises.  Pt is tolerating mobility well.     Follow Up Recommendations  Follow surgeon's recommendation for DC plan and follow-up therapies;Supervision for mobility/OOB     Equipment Recommendations  Rolling walker with 5" wheels;3in1 (PT)    Recommendations for Other Services       Precautions / Restrictions Precautions Precautions: Other (comment) Precaution Comments: R BKA Required Braces or Orthoses: Other Brace Other Brace: R BKA prosthetic Restrictions Weight Bearing Restrictions: Yes RLE Weight Bearing: Weight bearing as tolerated    Mobility  Bed Mobility Overal bed mobility: Needs Assistance Bed Mobility: Supine to Sit     Supine to sit: Supervision Sit to supine: Supervision   General bed mobility comments: supervision for safety, donned prosthetic edge of bed.  Transfers Overall transfer level: Needs assistance Equipment used: Rolling walker (2 wheeled) Transfers: Sit to/from Stand Sit to Stand: Min guard         General transfer comment: Cues for hand placement to and from seated surface.  Pt stand to click prosthetic into place.  Ambulation/Gait Ambulation/Gait assistance: Min guard Gait Distance (Feet): 350 Feet Assistive device: Rolling walker (2 wheeled) Gait Pattern/deviations: Step-through pattern;Antalgic;Trunk flexed     General Gait Details: Adjusted RW to improve fit. Pt  is progressing well and tolerates step through pattern.  He required cues for upper trunk control.   Stairs             Wheelchair Mobility    Modified Rankin (Stroke Patients Only)       Balance Overall balance assessment: Needs assistance Sitting-balance support: No upper extremity supported;Feet supported Sitting balance-Leahy Scale: Fair       Standing balance-Leahy Scale: Fair                              Cognition Arousal/Alertness: Awake/alert Behavior During Therapy: WFL for tasks assessed/performed Overall Cognitive Status: Within Functional Limits for tasks assessed                                 General Comments: Pt continues to complain of minor dizziness but this does not hinder his mobility.      Exercises Total Joint Exercises Ankle Circles/Pumps: Left;20 reps;Supine;AROM Quad Sets: Right;10 reps;AROM;Supine Short Arc Quad: AROM;Right;10 reps;Supine Heel Slides: AROM;Right;10 reps;Supine Hip ABduction/ADduction: AROM;Right;10 reps;Supine    General Comments        Pertinent Vitals/Pain Pain Assessment: 0-10 Pain Score: 5  Pain Location: R hip Pain Descriptors / Indicators: Sharp;Aching Pain Intervention(s): Monitored during session;Repositioned    Home Living Family/patient expects to be discharged to:: Private residence Living Arrangements: Alone                  Prior Function            PT Goals (current goals can now be found in the  care plan section) Acute Rehab PT Goals Patient Stated Goal: walk with less pain Potential to Achieve Goals: Good Progress towards PT goals: Progressing toward goals    Frequency    7X/week      PT Plan Current plan remains appropriate    Co-evaluation              AM-PAC PT "6 Clicks" Mobility   Outcome Measure  Help needed turning from your back to your side while in a flat bed without using bedrails?: None Help needed moving from lying on  your back to sitting on the side of a flat bed without using bedrails?: None Help needed moving to and from a bed to a chair (including a wheelchair)?: A Little Help needed standing up from a chair using your arms (e.g., wheelchair or bedside chair)?: A Little Help needed to walk in hospital room?: A Lot Help needed climbing 3-5 steps with a railing? : A Lot 6 Click Score: 18    End of Session Equipment Utilized During Treatment: Gait belt Activity Tolerance: Patient limited by lethargy;Patient limited by pain;Treatment limited secondary to medical complications (Comment) Patient left: in chair;with call bell/phone within reach Nurse Communication: Mobility status(continues to complain of dizziness.) PT Visit Diagnosis: Unsteadiness on feet (R26.81);Muscle weakness (generalized) (M62.81);Difficulty in walking, not elsewhere classified (R26.2);Pain Pain - Right/Left: Right Pain - part of body: Hip     Time: 4431-5400 PT Time Calculation (min) (ACUTE ONLY): 23 min  Charges:  $Gait Training: 8-22 mins $Therapeutic Exercise: 8-22 mins                     Russell Garcia, Russell Garcia Acute Rehabilitation Services Pager [phone] Office [phone]     Russell Garcia 06/08/2019, 11:26 AM

## 2019-06-08 NOTE — Progress Notes (Addendum)
Physical Therapy Treatment Patient Details Name: Russell Garcia MRN: [id number] DOB: Sep 01, 1956 Today's Date: 06/08/2019    History of Present Illness Pt is a 63 year old male presenting s/p R THA direct anterior approach. PMH includes HTN, hyperlipidemia, anxiety/depression, R BKA, and lumbar radiculopathy.    PT Comments    Pt performed gt training and funcitonal mobility.  Performed stair negotiation with RW and progressed to standing exercises.  Pt impaired with knee flexion and Hip extension due to lack of dorsiflexion with mobility.  Plan to review standing HEP next session without limb donned.      Follow Up Recommendations  Follow surgeon's recommendation for DC plan and follow-up therapies;Supervision for mobility/OOB     Equipment Recommendations  Rolling walker with 5" wheels;3in1 (PT)    Recommendations for Other Services       Precautions / Restrictions Precautions Precautions: Other (comment) Precaution Comments: R BKA Required Braces or Orthoses: Other Brace Other Brace: R BKA prosthetic Restrictions Weight Bearing Restrictions: Yes RLE Weight Bearing: Weight bearing as tolerated    Mobility  Bed Mobility Overal bed mobility: Needs Assistance Bed Mobility: Supine to Sit     Supine to sit: Modified independent (Device/Increase time) Sit to supine: Modified independent (Device/Increase time)   General bed mobility comments: with use of rails and bed slightly elevated.  Transfers Overall transfer level: Needs assistance Equipment used: Rolling walker (2 wheeled) Transfers: Sit to/from Stand Sit to Stand: Min guard         General transfer comment: Cues for hand placement to and from seated surface.  Pt stand to click prosthetic into place.  Ambulation/Gait Ambulation/Gait assistance: Min guard Gait Distance (Feet): 350 Feet Assistive device: Rolling walker (2 wheeled) Gait Pattern/deviations: Step-through pattern;Antalgic;Trunk flexed     General  Gait Details: Adjusted RW to improve fit. Pt is progressing well and tolerates step through pattern.  He required cues for upper trunk control.   Stairs Stairs: Yes Stairs assistance: Min assist Stair Management: Backwards;With walker;Step to pattern Number of Stairs: 4 General stair comments: Cues for sequencing and RW placement.   Wheelchair Mobility    Modified Rankin (Stroke Patients Only)       Balance Overall balance assessment: Needs assistance Sitting-balance support: No upper extremity supported;Feet supported Sitting balance-Leahy Scale: Fair       Standing balance-Leahy Scale: Fair                              Cognition Arousal/Alertness: Awake/alert Behavior During Therapy: WFL for tasks assessed/performed Overall Cognitive Status: Within Functional Limits for tasks assessed                                 General Comments: Pt continues to complain of minor dizziness but this does not hinder his mobility.      Exercises Total Joint Exercises Ankle Circles/Pumps: Left;20 reps;Supine;AROM Quad Sets: Right;10 reps;AROM;Supine Short Arc Quad: AROM;Right;10 reps;Supine Heel Slides: AROM;Right;10 reps;Supine Hip ABduction/ADduction: AROM;Right;10 reps;Standing Knee Flexion: AROM;Right;10 reps;Standing;Limitations Knee Flexion Limitations: prosthesis impairs ROM. Marching in Standing: AROM;10 reps;Standing;Right Standing Hip Extension: AROM;Right;10 reps;Standing;Limitations Standing Hip Extension Limitations: prosthesis impairs ROM    General Comments        Pertinent Vitals/Pain Pain Assessment: 0-10 Pain Score: 6  Pain Location: R hip Pain Descriptors / Indicators: Sharp;Aching Pain Intervention(s): Monitored during session;Repositioned    Home Living  Prior Function            PT Goals (current goals can now be found in the care plan section) Acute Rehab PT Goals Patient Stated Goal:  walk with less pain Potential to Achieve Goals: Good Progress towards PT goals: Progressing toward goals    Frequency    7X/week      PT Plan Current plan remains appropriate    Co-evaluation              AM-PAC PT "6 Clicks" Mobility   Outcome Measure  Help needed turning from your back to your side while in a flat bed without using bedrails?: None Help needed moving from lying on your back to sitting on the side of a flat bed without using bedrails?: None Help needed moving to and from a bed to a chair (including a wheelchair)?: A Little Help needed standing up from a chair using your arms (e.g., wheelchair or bedside chair)?: A Little Help needed to walk in hospital room?: A Little Help needed climbing 3-5 steps with a railing? : A Little 6 Click Score: 20    End of Session Equipment Utilized During Treatment: Gait belt Activity Tolerance: Patient limited by lethargy;Patient limited by pain;Treatment limited secondary to medical complications (Comment) Patient left: in chair;with call bell/phone within reach Nurse Communication: Mobility status PT Visit Diagnosis: Unsteadiness on feet (R26.81);Muscle weakness (generalized) (M62.81);Difficulty in walking, not elsewhere classified (R26.2);Pain Pain - Right/Left: Right Pain - part of body: Hip     Time: 0165-5374 PT Time Calculation (min) (ACUTE ONLY): 18 min  Charges:  $Gait Training: 8-22 mins                     Governor Rooks, PTA Acute Rehabilitation Services Pager [phone] Office [phone]     Heran Campau Eli Hose 06/08/2019, 2:26 PM

## 2019-06-08 NOTE — Care Management Obs Status (Signed)
[facility] NOTIFICATION   Patient Details  Name: Russell Garcia MRN: [id number] Date of Birth: 10/19/1956   Medicare Observation Status Notification Given:  Yes    Marilu Favre, RN 06/08/2019, 11:49 AM

## 2019-06-08 NOTE — Progress Notes (Signed)
Subjective: 1 Day Post-Op Procedure(s) (LRB): RIGHT TOTAL HIP ARTHROPLASTY ANTERIOR APPROACH (Right) Patient reports pain as moderate.    Objective: Vital signs in last 24 hours: Temp:  [97.2 F (36.2 C)-99.9 F (37.7 C)] 98.2 F (36.8 C) (08/14 0354) Pulse Rate:  [52-80] 80 (08/14 0354) Resp:  [10-18] 17 (08/14 0354) BP: (113-142)/(62-99) 116/71 (08/14 0354) SpO2:  [92 %-100 %] 97 % (08/14 0354)  Intake/Output from previous day: 08/13 0701 - 08/14 0700 In: 2390.6 [P.O.:480; I.V.:1810.6; IV Piggyback:100] Out: 350 [Urine:200; Blood:150] Intake/Output this shift: No intake/output data recorded.  Recent Labs    06/07/19 0809 06/08/19 0239  HGB 13.4 11.5*   Recent Labs    06/07/19 0809 06/08/19 0239  WBC 5.6 10.6*  RBC 5.00 4.32  HCT 41.9 36.5*  PLT 180 153   Recent Labs    06/05/19 1154 06/08/19 0239  NA 138 137  K 4.1 4.3  CL 108 100  CO2 25 26  BUN 18 20  CREATININE 1.25* 1.70*  GLUCOSE 92 130*  CALCIUM 9.6 8.8*   No results for input(s): LABPT, INR in the last 72 hours.  Incision: dressing C/D/I The patient has a right below knee prosthesis  Assessment/Plan: 1 Day Post-Op Procedure(s) (LRB): RIGHT TOTAL HIP ARTHROPLASTY ANTERIOR APPROACH (Right) Up with therapy Plan for discharge tomorrow Discharge home with home health  He needs significant therapy prior to discharge to home given his right below knee prosthesis on his operative right side making him a considerable fall risk.    Mcarthur Rossetti 06/08/2019, 7:38 AM

## 2019-06-09 MED ORDER — OXYCODONE-ACETAMINOPHEN 5-325 MG PO TABS: 1.0000 | ORAL_TABLET | Freq: Four times a day (QID) | ORAL | 0 refills | Status: DC | PRN

## 2019-06-09 MED ORDER — ACETAMINOPHEN 325 MG PO TABS: 325.0000 mg | ORAL_TABLET | Freq: Four times a day (QID) | ORAL | Status: DC | PRN

## 2019-06-09 NOTE — Discharge Summary (Signed)
Discharge Diagnoses:  Principal Problem:   Unilateral primary osteoarthritis, right hip Active Problems:   Status post total replacement of right hip   Surgeries: Procedure(s): RIGHT TOTAL HIP ARTHROPLASTY ANTERIOR APPROACH on 06/07/2019    Consultants:   Discharged Condition: Improved  Hospital Course: Russell Garcia is an 63 y.o. male who was admitted 06/07/2019 with a chief complaint of right hip osteoarthritis, with a final diagnosis of osteoarthritis right hip.  Patient was brought to the operating room on 06/07/2019 and underwent Procedure(s): RIGHT TOTAL HIP ARTHROPLASTY ANTERIOR APPROACH.    Patient was given perioperative antibiotics:  Anti-infectives (From admission, onward)   Start     Dose/Rate Route Frequency Ordered Stop   06/07/19 1415  ceFAZolin (ANCEF) IVPB 2g/100 mL premix     2 g 200 mL/hr over 30 Minutes Intravenous Every 6 hours 06/07/19 1400 06/08/19 0700    .  Patient was given sequential compression devices, early ambulation, and aspirin for DVT prophylaxis.  Recent vital signs:  Patient Vitals for the past 24 hrs:  BP Temp Temp src Pulse Resp SpO2  06/09/19 0412 91/68 99.5 F (37.5 C) Oral 79 18 92 %  06/08/19 2001 111/66 99.9 F (37.7 C) Oral 85 17 93 %  06/08/19 1448 (!) 99/49 98.2 F (36.8 C) Oral 72 18 94 %  06/08/19 0834 91/72 98 F (36.7 C) Oral 73 - 94 %  .  Recent laboratory studies: Dg Pelvis Portable  Result Date: 06/07/2019 CLINICAL DATA:  Post total right hip arthroplasty. EXAM: PORTABLE PELVIS 1-2 VIEWS COMPARISON:  Intraoperative radiographs from the same date. FINDINGS: Post total right hip arthroplasty with normal alignment of the prosthetic components. No evidence of fracture. Expected soft tissue edema and emphysema. IMPRESSION: Post total right hip arthroplasty without complicating features. Electronically Signed   By: Fidela Salisbury M.D.   On: 06/07/2019 13:06   Dg C-arm 1-60 Min  Result Date: 06/07/2019 CLINICAL DATA:   Elective surgery. EXAM: DG C-ARM 61-120 MIN; OPERATIVE RIGHT HIP WITH PELVIS COMPARISON:  Preoperative radiograph 12/13/2018 FINDINGS: Two fluoroscopic spot images obtained in the operating room after right hip total arthroplasty. Fluoroscopy time not reported. IMPRESSION: Fluoroscopic spot views after right hip total arthroplasty. Electronically Signed   By: Keith Rake M.D.   On: 06/07/2019 12:35   Dg Hip Operative Unilat W Or W/o Pelvis Right  Result Date: 06/07/2019 CLINICAL DATA:  Elective surgery. EXAM: DG C-ARM 61-120 MIN; OPERATIVE RIGHT HIP WITH PELVIS COMPARISON:  Preoperative radiograph 12/13/2018 FINDINGS: Two fluoroscopic spot images obtained in the operating room after right hip total arthroplasty. Fluoroscopy time not reported. IMPRESSION: Fluoroscopic spot views after right hip total arthroplasty. Electronically Signed   By: Keith Rake M.D.   On: 06/07/2019 12:35    Discharge Medications:   Allergies as of 06/09/2019   No Known Allergies     Medication List    TAKE these medications   acetaminophen 325 MG tablet Commonly known as: TYLENOL Take 1-2 tablets (325-650 mg total) by mouth every 6 (six) hours as needed for mild pain (pain score 1-3 or temp > 100.5).   amLODipine 10 MG tablet Commonly known as: NORVASC Take 1 tablet (10 mg total) by mouth daily.   aspirin EC 325 MG tablet Take 325 mg by mouth 2 (two) times daily.   carvedilol 25 MG tablet Commonly known as: COREG Take 1 tablet (25 mg total) by mouth 2 (two) times daily with a meal.   diclofenac sodium 1 % Gel Commonly  known as: VOLTAREN Apply 2 g topically 4 (four) times daily as needed (pain).   lisinopril-hydrochlorothiazide 20-25 MG tablet Commonly known as: ZESTORETIC Take 1 tablet by mouth daily.   magnesium gluconate 500 MG tablet Commonly known as: MAGONATE Take 500 mg by mouth 3 (three) times a week.   oxyCODONE-acetaminophen 5-325 MG tablet Commonly known as: Percocet Take 1  tablet by mouth every 6 (six) hours as needed for severe pain.   spironolactone 25 MG tablet Commonly known as: Aldactone Take 1 tablet (25 mg total) by mouth daily.            Durable Medical Equipment  (From admission, onward)         Start     Ordered   06/08/19 1131  For home use only DME Walker  Once    Question:  Patient needs a walker to treat with the following condition  Answer:  H/O total hip arthroplasty, right   06/08/19 1131   06/07/19 1401  DME 3 n 1  Once     06/07/19 1400           Discharge Care Instructions  (From admission, onward)         Start     Ordered   06/09/19 0000  Change dressing    Comments: Leave dressing in place until follow up appointment next week.   06/09/19 0806          Diagnostic Studies: Dg Pelvis Portable  Result Date: 06/07/2019 CLINICAL DATA:  Post total right hip arthroplasty. EXAM: PORTABLE PELVIS 1-2 VIEWS COMPARISON:  Intraoperative radiographs from the same date. FINDINGS: Post total right hip arthroplasty with normal alignment of the prosthetic components. No evidence of fracture. Expected soft tissue edema and emphysema. IMPRESSION: Post total right hip arthroplasty without complicating features. Electronically Signed   By: Fidela Salisbury M.D.   On: 06/07/2019 13:06   Dg C-arm 1-60 Min  Result Date: 06/07/2019 CLINICAL DATA:  Elective surgery. EXAM: DG C-ARM 61-120 MIN; OPERATIVE RIGHT HIP WITH PELVIS COMPARISON:  Preoperative radiograph 12/13/2018 FINDINGS: Two fluoroscopic spot images obtained in the operating room after right hip total arthroplasty. Fluoroscopy time not reported. IMPRESSION: Fluoroscopic spot views after right hip total arthroplasty. Electronically Signed   By: Keith Rake M.D.   On: 06/07/2019 12:35   Dg Hip Operative Unilat W Or W/o Pelvis Right  Result Date: 06/07/2019 CLINICAL DATA:  Elective surgery. EXAM: DG C-ARM 61-120 MIN; OPERATIVE RIGHT HIP WITH PELVIS COMPARISON:   Preoperative radiograph 12/13/2018 FINDINGS: Two fluoroscopic spot images obtained in the operating room after right hip total arthroplasty. Fluoroscopy time not reported. IMPRESSION: Fluoroscopic spot views after right hip total arthroplasty. Electronically Signed   By: Keith Rake M.D.   On: 06/07/2019 12:35    Patient benefited maximally from their hospital stay and there were no complications.     Disposition: Discharge disposition: 01-Home or Self Care      Discharge Instructions    Call MD / Call 911   Complete by: As directed    If you experience chest pain or shortness of breath, CALL 911 and be transported to the hospital emergency room.  If you develope a fever above 101 F, pus ([place] drainage) or increased drainage or redness at the wound, or calf pain, call your surgeon's office.   Change dressing   Complete by: As directed    Leave dressing in place until follow up appointment next week.   Constipation Prevention  Complete by: As directed    Drink plenty of fluids.  Prune juice may be helpful.  You may use a stool softener, such as Colace (over the counter) 100 mg twice a day.  Use MiraLax (over the counter) for constipation as needed.   Diet - low sodium heart healthy   Complete by: As directed    Follow the hip precautions as taught in Physical Therapy   Complete by: As directed    Increase activity slowly as tolerated   Complete by: As directed      Follow-up Information    [facility] Follow up.   Specialty: Home Health Services Contact information: [address] [address] [phone]        Mcarthur Rossetti, MD Follow up in 1 week(s).   Specialty: Orthopedic Surgery Contact information: [place] [address] [phone]            Signed: Erlinda Hong, PA-C 06/09/2019, 8:07 AM  [facility] [phone]

## 2019-06-09 NOTE — Progress Notes (Signed)
Subjective: 2 Days Post-Op Procedure(s) (LRB): RIGHT TOTAL HIP ARTHROPLASTY ANTERIOR APPROACH (Right) Patient reports pain as mild and moderate.    Objective: Vital signs in last 24 hours: Temp:  [98 F (36.7 C)-99.9 F (37.7 C)] 99.5 F (37.5 C) (08/15 0412) Pulse Rate:  [72-85] 79 (08/15 0412) Resp:  [17-18] 18 (08/15 0412) BP: (91-111)/(49-72) 91/68 (08/15 0412) SpO2:  [92 %-94 %] 92 % (08/15 0412)  Intake/Output from previous day: 08/14 0701 - 08/15 0700 In: 480 [P.O.:480] Out: 700 [Urine:700] Intake/Output this shift: No intake/output data recorded.  Recent Labs    06/07/19 0809 06/08/19 0239  HGB 13.4 11.5*   Recent Labs    06/07/19 0809 06/08/19 0239  WBC 5.6 10.6*  RBC 5.00 4.32  HCT 41.9 36.5*  PLT 180 153   Recent Labs    06/08/19 0239  NA 137  K 4.3  CL 100  CO2 26  BUN 20  CREATININE 1.70*  GLUCOSE 130*  CALCIUM 8.8*   No results for input(s): LABPT, INR in the last 72 hours.  Right hip incision clean, dry and intact. No drainage on dressing.   Assessment/Plan: 2 Days Post-Op Procedure(s) (LRB): RIGHT TOTAL HIP ARTHROPLASTY ANTERIOR APPROACH (Right) Discharge home with home health   Anticipated LOS equal to or greater than 2 midnights due to - Age 27 and older with one or more of the following:  - Obesity  - Expected need for hospital services (PT, OT, Nursing) required for safe  discharge  - Anticipated need for postoperative skilled nursing care or inpatient rehab  - Active co-morbidities: Right below the knee amputation OR   - Unanticipated findings during/Post Surgery: None  - Patient is a high risk of re-admission due to: None    Rodrigo Mcgranahan , PA-C 06/09/2019, 7:57 AM  [facility] [phone]

## 2019-06-09 NOTE — Plan of Care (Signed)
  Problem: Education: Goal: Knowledge of General Education information will improve Description: Including pain rating scale, medication(s)/side effects and non-pharmacologic comfort measures Outcome: Adequate for Discharge   Problem: Activity: Goal: Risk for activity intolerance will decrease Outcome: Adequate for Discharge   Problem: Pain Managment: Goal: General experience of comfort will improve Outcome: Adequate for Discharge

## 2019-06-09 NOTE — Progress Notes (Signed)
Physical Therapy Treatment Patient Details Name: Russell Garcia MRN: [id number] DOB: 1956/02/16 Today's Date: 06/09/2019    History of Present Illness Pt is a 63 year old male presenting s/p R THA direct anterior approach. PMH includes HTN, hyperlipidemia, anxiety/depression, R BKA, and lumbar radiculopathy.    PT Comments    Pt performed gt training and functional mobility.  Reviewed stair training and standing HEP. Pt issued HEP for continued use at home.  Plan to return home today,  Educated on frequency of HEP and continued walking.  Pt is ready to d/c home and RN informed.    Follow Up Recommendations  Follow surgeon's recommendation for DC plan and follow-up therapies;Supervision for mobility/OOB     Equipment Recommendations  Rolling walker with 5" wheels;3in1 (PT)    Recommendations for Other Services       Precautions / Restrictions Precautions Precautions: Other (comment) Precaution Comments: R BKA Required Braces or Orthoses: Other Brace Other Brace: R BKA prosthetic Restrictions Weight Bearing Restrictions: Yes RLE Weight Bearing: Weight bearing as tolerated    Mobility  Bed Mobility Overal bed mobility: Needs Assistance Bed Mobility: Supine to Sit     Supine to sit: Modified independent (Device/Increase time)        Transfers Overall transfer level: Needs assistance Equipment used: Rolling walker (2 wheeled) Transfers: Sit to/from Stand Sit to Stand: Supervision         General transfer comment: Cues for hand placement, prosthetic donned before session started.  Ambulation/Gait Ambulation/Gait assistance: Supervision Gait Distance (Feet): 350 Feet Assistive device: Rolling walker (2 wheeled) Gait Pattern/deviations: Step-through pattern;Antalgic;Trunk flexed     General Gait Details: Cues to step closer to RW to improve posture.   Stairs Stairs: Yes Stairs assistance: Min assist Stair Management: Backwards;With walker;Step to  pattern Number of Stairs: 4 General stair comments: Cues for sequencing and RW placement.  Pt educated on technique.   Wheelchair Mobility    Modified Rankin (Stroke Patients Only)       Balance   Sitting-balance support: No upper extremity supported;Feet supported Sitting balance-Leahy Scale: Fair     Standing balance support: Bilateral upper extremity supported;During functional activity Standing balance-Leahy Scale: Fair                              Cognition Arousal/Alertness: Awake/alert Behavior During Therapy: WFL for tasks assessed/performed Overall Cognitive Status: Within Functional Limits for tasks assessed                                 General Comments: Pt continues to complain of minor dizziness but this does not hinder his mobility.      Exercises Total Joint Exercises Hip ABduction/ADduction: AROM;Right;10 reps;Standing Knee Flexion: AROM;Right;10 reps;Standing;Limitations Marching in Standing: AROM;10 reps;Standing;Right Standing Hip Extension: AROM;Right;10 reps;Standing;Limitations    General Comments        Pertinent Vitals/Pain Pain Assessment: 0-10 Pain Score: 4  Pain Location: R hip Pain Descriptors / Indicators: Sharp;Aching Pain Intervention(s): Monitored during session;Repositioned    Home Living                      Prior Function            PT Goals (current goals can now be found in the care plan section) Acute Rehab PT Goals Patient Stated Goal: walk with less pain Potential to Achieve Goals: Good Progress  towards PT goals: Progressing toward goals    Frequency    7X/week      PT Plan Current plan remains appropriate    Co-evaluation              AM-PAC PT "6 Clicks" Mobility   Outcome Measure  Help needed turning from your back to your side while in a flat bed without using bedrails?: None Help needed moving from lying on your back to sitting on the side of a flat bed  without using bedrails?: None Help needed moving to and from a bed to a chair (including a wheelchair)?: A Little Help needed standing up from a chair using your arms (e.g., wheelchair or bedside chair)?: A Little Help needed to walk in hospital room?: A Little Help needed climbing 3-5 steps with a railing? : A Little 6 Click Score: 20    End of Session Equipment Utilized During Treatment: Gait belt Activity Tolerance: Patient limited by lethargy;Patient limited by pain;Treatment limited secondary to medical complications (Comment) Patient left: in chair;with call bell/phone within reach Nurse Communication: Mobility status PT Visit Diagnosis: Unsteadiness on feet (R26.81);Muscle weakness (generalized) (M62.81);Difficulty in walking, not elsewhere classified (R26.2);Pain Pain - Right/Left: Right Pain - part of body: Hip     Time: 1047-1110 PT Time Calculation (min) (ACUTE ONLY): 23 min  Charges:  $Gait Training: 8-22 mins $Therapeutic Exercise: 8-22 mins                     Russell Garcia, PTA Acute Rehabilitation Services Pager [phone] Office [place] 06/09/2019, 12:30 PM

## 2019-06-09 NOTE — Discharge Instructions (Signed)

## 2019-06-09 NOTE — Progress Notes (Signed)
Pt given discharge instructions and gone over with him. Pt verbalized understanding. All questions answered. All belongings gathered to be sent home. Pt's equipment in room.

## 2019-06-09 NOTE — TOC Transition Note (Signed)
Transition of Care [facility]) - CM/SW Discharge Note   Patient Details  Name: Russell Garcia MRN: [id number] Date of Birth: 06/22/1956  [facility] of [facility]) CM/SW Contact:  Erenest Rasher, RN Phone Number: [phone] 06/09/2019, 10:25 AM   Clinical Narrative:     Per TOC CM previous notes, pt HH was arranged with [facility]. Faxed dc summary and HH orders to [facility]. DME was delivered to pt's room.   Final next level of care: [place] Barriers to Discharge: No Barriers Identified   Patient Goals and CMS Choice Patient states their goals for this hospitalization and ongoing recovery are:: to go home CMS Medicare.gov Compare Post Acute Care list provided to:: Patient Choice offered to / list presented to : Patient  Discharge Placement                       Discharge Plan and Services   Discharge Planning Services: CM Consult Post Acute Care Choice: Durable Medical Equipment, Home Health          DME Arranged: Gilford Rile, 3-N-1 DME Agency: [facility] Date DME Agency Contacted: 06/08/19 Time DME Agency Contacted: [phone] Representative spoke with at DME Agency: [place]: PT [place]: [facility] Date [place]: 06/08/19 Time [place]: 81 Representative spoke with at [place]: Faxed orders and discharge summary to [facility]  Social Determinants of Health ([name]) Interventions     Readmission Risk Interventions No flowsheet data found.

## 2019-06-10 ENCOUNTER — Encounter (HOSPITAL_COMMUNITY): Payer: Self-pay | Admitting: Orthopaedic Surgery

## 2019-06-11 ENCOUNTER — Telehealth: Payer: Self-pay | Admitting: Family Medicine

## 2019-06-11 NOTE — Telephone Encounter (Signed)
Knipp from [facility] called stating that they received a referral from [facility] in reference to this patient.  They called to let us know that they are not having any success of scheduling the patient.  They have made several attempts and have left voicemails.  CB#[phone].  Thank you.

## 2019-06-11 NOTE — Telephone Encounter (Signed)
I have called and left voicemail with patient asking him to please give them a call back for physical therapy

## 2019-06-12 ENCOUNTER — Telehealth: Payer: Self-pay | Admitting: Orthopedic Surgery

## 2019-06-12 NOTE — Telephone Encounter (Signed)
Nip/[facility]/PT called and stated that pt is declining HH and requesting to go to OP.  Wants someone to set that up and give patient a call.[phone]

## 2019-06-12 NOTE — Telephone Encounter (Signed)
Pt is s/p a right total hip on8/13/20 refusing in home physical therapy and wants order for out patient. Ok to set up and what orders do you want outpatient to follow?

## 2019-06-13 ENCOUNTER — Telehealth: Payer: Self-pay

## 2019-06-13 ENCOUNTER — Other Ambulatory Visit: Payer: Self-pay

## 2019-06-13 DIAGNOSIS — Z96641 Presence of right artificial hip joint: Secondary | ICD-10-CM

## 2019-06-13 NOTE — Telephone Encounter (Signed)
Patient aware of the below and that I have sent an order to PT for them

## 2019-06-13 NOTE — Telephone Encounter (Signed)
I sent this to Dr. Marlou Sa to ask what PT order he would like for out patient therapy and his response was call pt..i am sorry

## 2019-06-13 NOTE — Telephone Encounter (Signed)
Patient never had HHPT, states he's doing ok. Wants to know if he can drive?

## 2019-06-13 NOTE — Telephone Encounter (Signed)
Dr. Blackman patient 

## 2019-06-13 NOTE — Telephone Encounter (Signed)
Cb patient

## 2019-06-13 NOTE — Telephone Encounter (Signed)
He can drive from my standpoint.

## 2019-06-19 ENCOUNTER — Ambulatory Visit: Payer: Medicare HMO | Attending: Orthopaedic Surgery | Admitting: Physical Therapy

## 2019-06-19 ENCOUNTER — Other Ambulatory Visit: Payer: Self-pay

## 2019-06-19 ENCOUNTER — Encounter: Payer: Self-pay | Admitting: Physical Therapy

## 2019-06-19 DIAGNOSIS — M25651 Stiffness of right hip, not elsewhere classified: Secondary | ICD-10-CM

## 2019-06-19 DIAGNOSIS — R2689 Other abnormalities of gait and mobility: Secondary | ICD-10-CM | POA: Diagnosis not present

## 2019-06-19 DIAGNOSIS — M6281 Muscle weakness (generalized): Secondary | ICD-10-CM

## 2019-06-19 DIAGNOSIS — M25551 Pain in right hip: Secondary | ICD-10-CM

## 2019-06-19 DIAGNOSIS — R262 Difficulty in walking, not elsewhere classified: Secondary | ICD-10-CM | POA: Diagnosis not present

## 2019-06-19 NOTE — Therapy (Signed)
[facility] [address]  [place], [place], [address] Phone: [phone]   Fax:  [phone]  Physical Therapy Evaluation  Patient Details  Name: Russell Garcia MRN: [id number] Date of Birth: 1956-03-17 Referring Provider (PT): Jean Rosenthal, MD   Encounter Date: 06/19/2019  PT End of Session - 06/19/19 1618    Visit Number  1    Number of Visits  12    Date for PT Re-Evaluation  07/31/19    Authorization Type  [employer] Medicare    PT Start Time  1618    PT Stop Time  1701    PT Time Calculation (min)  43 min    Activity Tolerance  Patient tolerated treatment well    Behavior During Therapy  [facility] for tasks assessed/performed       Past Medical History:  Diagnosis Date  . Anxiety   . Arthritis   . Depression   . 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 Hypertension 2009    Past Surgical History:  Procedure Laterality Date  . INGUINAL HERNIA REPAIR    . R BKA    . TOTAL HIP ARTHROPLASTY Right 06/07/2019  . TOTAL HIP ARTHROPLASTY Right 06/07/2019   Procedure: RIGHT TOTAL HIP ARTHROPLASTY ANTERIOR APPROACH;  Surgeon: Mcarthur Rossetti, MD;  Location: [facility];  Service: Orthopedics;  Laterality: Right;    There were no vitals filed for this visit.   Subjective Assessment - 06/19/19 1621    Subjective  Pt is s/p R THR on 06/07/19 with 3-night post-op returning home on Sunday at which time he self-weaned from RW. Has been working on HEP provided at hospital daily. Staples remain in place with intact dressing (not removed as pt reporting this was the last dressing change provide at discharge from hospital and MD f/u not yet scheduled). Greatest limitations at present are pain, limited gait speed, stair negotiation and unable to run.    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  1.5 hrs    How long can you stand comfortably?  10-15 minutes    How long can you walk comfortably?  unsure (has onlt walked short distances thus far)    Patient Stated Goals  "be able to run, go up steps and have no pain"    Currently in Pain?  Yes    Pain Score  5    4-5/10, up to 6-7/10 at night   Pain Location  Hip    Pain Orientation  Right;Lateral    Pain Descriptors / Indicators  Throbbing    Pain Type  Surgical pain;Acute pain    Pain Radiating Towards  into anterior thigh    Pain Onset  1 to 4 weeks ago    Pain Frequency  Intermittent    Aggravating Factors   sleeping, driving    Pain Relieving Factors  Tylenol, ice    Effect of Pain on Daily Activities  avoids steps         [facility] PT Assessment - 06/19/19 1618      Assessment   Medical Diagnosis  R THR    Referring Provider (PT)  Jean Rosenthal, MD    Onset Date/Surgical Date  06/07/19    Hand Dominance  Right    Next MD Visit  TBD    Prior Therapy  PT ~3 yrs ago for LBP x 2 visits; patient also reports PT ~3 yrs ago for R hip pain but no records found      Precautions   Precautions  Anterior Hip    Required Braces or Orthoses  Other Brace/Splint    Other Brace/Splint  R BKA prosthesis      Restrictions   Weight Bearing Restrictions  Yes    RLE Weight Bearing  Weight bearing as tolerated      Balance Screen   Has the patient fallen in the past 6 months  No    Has the patient had a decrease in activity level because of a fear of falling?   Yes    Is the patient reluctant to leave their home because of a fear of falling?   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    Leisure  used to enjoy running but has not been able to for ~3 yrs; no other regular exercise or activity      Cognition   Overall Cognitive Status  Within Functional Limits for tasks assessed      Observation/Other Assessments   Focus on Therapeutic Outcomes (FOTO)   Hip - 46% (54% limitation); Predicted 72% (28% limitation)      ROM / Strength   AROM / PROM / Strength  AROM;Strength      AROM   Overall AROM Comments  R hip flexion to ~90 dg with extension to neutral       Strength   Strength Assessment Site  Hip;Knee    Right/Left Hip  Right;Left    Right Hip Flexion  4-/5    Right Hip Extension  4-/5    Right Hip ABduction  3-/5    Left Hip Flexion  5/5    Left Hip Extension  4/5    Left Hip ABduction  5/5    Right/Left Knee  Left    Left Knee Flexion  5/5    Left Knee Extension  5/5      Ambulation/Gait   Ambulation/Gait Assistance  5: Supervision    Assistive device  Straight cane    Gait Pattern  Step-through pattern;Decreased weight shift to right;Decreased stance time - right;Decreased hip/knee flexion - right;Right circumduction;Antalgic    Ambulation Surface  Level;Indoor    Gait Comments  Pt reporting he is inconsistent with use of cane on R vs L - provided instruction in proper hand usage with cane as well as sequencing of cane with gait pattern. Significant reduction in gait deviations following instruction although still slight favoring of R LE.                Objective measurements completed on examination: See above findings.                PT Short Term Goals - 06/19/19  [place] #1   Title  Patient will be independent with initial HEP    Status  New    Target Date  07/10/19      PT SHORT TERM GOAL #2   Title  Patient will ambulate with normal gait pattern with SPC or LRAD to increase safety with gait    Status  New    Target Date   07/10/19        PT Long Term Goals - 06/19/19 1747      PT LONG TERM GOAL #1   Title  Patient will be independent with ongoing/advanced HEP    Status  New    Target Date  07/31/19      PT LONG TERM GOAL #2   Title  R hip strength >/= 4+/5 for improved stability    Status  New    Target Date  07/31/19      PT LONG TERM GOAL #3   Title  Patient will ambulate with normal gait pattern on all surfaces w/o AD    Status  New    Target Date  07/31/19      PT LONG TERM GOAL #4   Title  Patient will negotiate stairs normally w/o limitaitons due to R hip pain or weakness    Status  New    Target Date  07/31/19      PT LONG TERM GOAL #5   Title  Pain level will be no more than 2/10 with all functional activities and patient will be able to sleep through the night w/o pain interference    Status  New    Target Date  07/31/19             Plan - 06/19/19 1701    Clinical Impression Statement  Sergei is a 63 y/o male who presents to OP PT 12 days s/p R THR due to unilateral hip OA. PMHx remarkable for R BKA since childhood. He had 3-night post-op acute care stay with discharge to home with no HH services but has been working HEP provided at discharge from hospital. He reports he self-weaned from RW to [facility] as of discharge from hospital and uses cane interchangeably in right and left hands, although he is aware that he should be using it in the L hand. Cane height adjustment assessed and instruction in proper use and sequencing of SPC provided with significant reduction in gait deviations following this. Rehab amenable deficits include R hip pain, decreased R hip ROM and strength, altered gait pattern with current dependence on SPC, decreased balance and limitations in functional activities. [place] will benefit from skilled physical therapy to address the above deficits/limitations and restore previous level of function.    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); chronic LBP with radiculopathy; HTN; obesity    Examination-Activity Limitations  Bed Mobility;Bend;Locomotion Level;Sit;Sleep;Squat;Stairs;Stand;Transfers    Examination-Participation Restrictions  Community Activity    Stability/Clinical Decision Making  Evolving/Moderate complexity    Clinical Decision Making  Moderate    Rehab Potential  Good    PT Frequency  2x / week    PT Duration  6 weeks    PT Treatment/Interventions  ADLs/Self Care Home Management;Cryotherapy;Electrical Stimulation;Iontophoresis 4mg /ml Dexamethasone;Moist Heat;Gait training;Stair training;Functional mobility training;Therapeutic activities;Therapeutic exercise;Balance training;Neuromuscular re-education;Patient/family education;Manual techniques;Scar mobilization;Passive range of motion;Dry needling;Taping;Vasopneumatic Device    PT Next  Visit Plan  Review hospital discharge HEP & update as indicated; review gait training PRN    Consulted and Agree with Plan of Care  Patient       Patient will benefit from skilled therapeutic intervention in order to improve the following deficits and impairments:  Abnormal gait, Decreased activity tolerance, Decreased balance, Decreased endurance, Decreased knowledge of use of DME, Decreased mobility, Decreased range of motion, Decreased safety awareness, Decreased scar mobility, Decreased strength, Difficulty walking, Impaired flexibility, Pain  Visit Diagnosis: Pain in right hip  Stiffness of right hip, not elsewhere classified  Muscle weakness (generalized)  Other abnormalities of gait and mobility  Difficulty in walking, not elsewhere classified     Problem List Patient Active Problem List   Diagnosis Date Noted  . Status post total replacement of right hip 06/07/2019  . Unilateral primary osteoarthritis, right hip 03/15/2019  . Right hip pain 05/14/2016  . Lumbar radiculopathy, chronic  05/14/2016  . Gross hematuria 09/22/2012  . Dermatitis 03/09/2011  . Hyperlipidemia 03/09/2011  . NUMBNESS 05/16/2010  . 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 Essential hypertension 07/31/2008  . Morbid obesity ([place]) 08/22/2007  . HERNIA, HX OF 08/16/2007  . Status post below knee amputation ([place]) 08/16/2007    Percival Spanish, PT, MPT 06/19/2019, 6:00 PM  [facility] [address]  [place], [place], [address] Phone: [phone]   Fax:  [phone]  Name: KADE MAZZIOTTI MRN: [id number] Date of Birth: [birth year]

## 2019-06-26 ENCOUNTER — Ambulatory Visit: Payer: Medicare HMO | Attending: Orthopaedic Surgery | Admitting: Physical Therapy

## 2019-06-26 ENCOUNTER — Encounter: Payer: Self-pay | Admitting: Physical Therapy

## 2019-06-26 ENCOUNTER — Other Ambulatory Visit: Payer: Self-pay

## 2019-06-26 DIAGNOSIS — M6281 Muscle weakness (generalized): Secondary | ICD-10-CM | POA: Diagnosis not present

## 2019-06-26 DIAGNOSIS — R262 Difficulty in walking, not elsewhere classified: Secondary | ICD-10-CM | POA: Diagnosis not present

## 2019-06-26 DIAGNOSIS — R2689 Other abnormalities of gait and mobility: Secondary | ICD-10-CM | POA: Insufficient documentation

## 2019-06-26 DIAGNOSIS — M25551 Pain in right hip: Secondary | ICD-10-CM | POA: Insufficient documentation

## 2019-06-26 DIAGNOSIS — M25651 Stiffness of right hip, not elsewhere classified: Secondary | ICD-10-CM

## 2019-06-26 NOTE — Therapy (Addendum)
[facility] [address]  [place], [place], [address] Phone: [phone]   Fax:  [phone]  Physical Therapy Treatment / Discharge Summary  Patient Details  Name: Russell Garcia MRN: [id number] Date of Birth: 07-Feb-1956 Referring Provider (PT): Jean Rosenthal, MD   Encounter Date: 06/26/2019  PT End of Session - 06/26/19 1532    Visit Number  2    Number of Visits  12    Date for PT Re-Evaluation  07/31/19    Authorization Type  Aetna Medicare    PT Start Time  1532    PT Stop Time  1613    PT Time Calculation (min)  41 min    Activity Tolerance  Patient tolerated treatment well    Behavior During Therapy  [facility] for tasks assessed/performed       Past Medical History:  Diagnosis Date  . Anxiety   . Arthritis   . Depression   . 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 Hypertension 2009    Past Surgical History:  Procedure Laterality Date  . INGUINAL HERNIA REPAIR    . R BKA    . TOTAL HIP ARTHROPLASTY Right 06/07/2019  . TOTAL HIP ARTHROPLASTY Right 06/07/2019   Procedure: RIGHT TOTAL HIP ARTHROPLASTY ANTERIOR APPROACH;  Surgeon: Mcarthur Rossetti, MD;  Location: [facility];  Service: Orthopedics;  Laterality: Right;    There were no vitals filed for this visit.  Subjective Assessment - 06/26/19 1536    Subjective  Pt reporting "discomfort" in upper R buttock/low back upon rising after sitting for longer periods but otherwise no pain.    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  No/denies    Pain Score  0-No pain   up to 7-8/10 when getting up after sitting for a while                      [facility] Adult PT Treatment/Exercise - 06/26/19 1532      Ambulation/Gait   Ambulation Distance (Feet)  80 Feet    Assistive device  Straight  cane    Gait Pattern  Step-through pattern;Decreased weight shift to right;Decreased stance time - right;Decreased hip/knee flexion - right;Right circumduction;Antalgic    Ambulation Surface  Level;Indoor    Gait Comments  Better sequencing with cane but still with limited weight shift to R along with decreaed R hip and knee flexion.      Exercises   Exercises  Knee/Hip      Knee/Hip Exercises: Stretches   Piriformis Stretch  Right;30 seconds;2 reps    Piriformis Stretch Limitations  KTOS      Knee/Hip Exercises: Aerobic   Nustep  L5 x 6 min - LE only      Knee/Hip Exercises: Standing   Hip Flexion  Right;Left;10 reps;2 sets;Knee straight;Knee bent    Hip Flexion Limitations  SLR & marching    Hip Abduction  Right;Left;10 reps;Knee straight;Stengthening    Abduction Limitations  cues to minimize L hip ER and avoid trunk lateral lean    Hip Extension  Right;Left;10 reps;Knee straight;Stengthening    Extension Limitations  cues to maintain knee extension and minimize trunk flexion    Lateral Step Up  Right;10 reps;Step Height: 4";Hand Hold: 2    Forward Step Up  Right;10 reps;Step Height: 4";Hand Hold: 1    Functional Squat  10 seconds;3 seconds    Functional Squat Limitations  counter squat      Knee/Hip Exercises: Supine   Straight Leg Raises  Right;5 reps;Strengthening    Straight Leg Raises Limitations  pt instructed to try at home w/o prosthesis due to pain at anterior hip      Manual Therapy   Manual Therapy  Other (comment)    Other Manual Therapy  Instructed pt in self-STM to upper R glutes with small ball on wall             PT Education - 06/26/19 1612    Education Details  Hospital HEP review & progression; sefl-STM to glutes using small ball on wall    Person(s) Educated  Patient    Methods  Explanation;Demonstration;Handout    Comprehension  Verbalized understanding;Returned demonstration;Need further instruction       PT Short Term Goals - 06/26/19 1539       PT SHORT TERM GOAL #1   Title  Patient will be independent with initial HEP    Status  On-going    Target Date  07/10/19      PT SHORT TERM GOAL #2   Title  Patient will ambulate with normal gait pattern with SPC or LRAD to increase safety with gait    Status  On-going    Target Date  07/10/19        PT Long Term Goals - 06/26/19 1540      PT LONG TERM GOAL #1   Title  Patient will be independent with ongoing/advanced HEP    Status  On-going    Target Date  07/31/19      PT LONG TERM GOAL #2   Title  R hip strength >/= 4+/5 for improved stability    Status  On-going    Target Date  07/31/19      PT LONG TERM GOAL #3   Title  Patient will ambulate with normal gait pattern on all surfaces w/o AD    Status  On-going    Target Date  07/31/19      PT LONG TERM GOAL #4   Title  Patient will negotiate stairs normally w/o limitaitons due to R hip pain or weakness    Status  On-going    Target Date  07/31/19      PT LONG TERM GOAL #5   Title  Pain level will be no more than 2/10 with all functional activities and patient will be able to sleep through the night w/o pain interference    Status  On-going    Target Date  07/31/19            Plan - 06/26/19 1541    Clinical Impression Statement  [name] reporting pain in upper lateral R glutes upon rising after prolonged sitting but otherwise no pain today. Provided instruction in self-STM using ball on wall to alleviate glute pain as well as gentle glute/piriformis stretch. Reviewed hospital issued HEP clarifying posture and movement patterns especially slowing pace to better isolate desired muscle activity. Patient reporting sufficient challenge with standing SLRs lifting weight of prosthesis, therefore no resistance added. Standing exercises progressed to include standing marching, counter squats and step-ups with fatigue noted following standing exercises but no increased pain. Attempted supine SLR but deferred as pt reporting  increased anterior hip pain with  weight of prosthesis but states he can do this at home w/o prosthesis with no increase in pain. Patient declining ice at end of session but encouraged to ice at home as he anticipates some post-exercise muscle soreness. Reinforced need for continued use of SPC as increased gait instability noted when patient attempting to walk w/o cane especially when fatigued.    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); chronic LBP with radiculopathy; HTN; obesity    Rehab Potential  Good    PT Frequency  2x / week    PT Duration  6 weeks    PT Treatment/Interventions  ADLs/Self Care Home Management;Cryotherapy;Electrical Stimulation;Iontophoresis 42m/ml Dexamethasone;Moist Heat;Gait training;Stair training;Functional mobility training;Therapeutic activities;Therapeutic exercise;Balance training;Neuromuscular re-education;Patient/family education;Manual techniques;Scar mobilization;Passive range of motion;Dry needling;Taping;Vasopneumatic Device    PT Next Visit Plan  Proximal LE strengthening with functional emphasis; balance training; gait training as indicated eventually working on weaning AD; manual therapy and modalities PRN for pain    Consulted and Agree with Plan of Care  Patient       Patient will benefit from skilled therapeutic intervention in order to improve the following deficits and impairments:  Abnormal gait, Decreased activity tolerance, Decreased balance, Decreased endurance, Decreased knowledge of use of DME, Decreased mobility, Decreased range of motion, Decreased safety awareness, Decreased scar mobility, Decreased strength, Difficulty walking, Impaired flexibility, Pain  Visit Diagnosis: Pain in right hip  Stiffness of right hip, not elsewhere classified  Muscle weakness (generalized)  Other abnormalities of gait and mobility  Difficulty in walking, not elsewhere classified     Problem List Patient Active Problem List    Diagnosis Date Noted  . Status post total replacement of right hip 06/07/2019  . Unilateral primary osteoarthritis, right hip 03/15/2019  . Right hip pain 05/14/2016  . Lumbar radiculopathy, chronic 05/14/2016  . Gross hematuria 09/22/2012  . Dermatitis 03/09/2011  . Hyperlipidemia 03/09/2011  . NUMBNESS 05/16/2010  . 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 Essential hypertension 07/31/2008  . [place] 08/22/2007  . HERNIA, HX OF 08/16/2007  . Status post below knee amputation ([place] 08/16/2007    JPercival Spanish PT, MPT 06/26/2019, 6:40 PM  [facility] [place] [address]Phone: [phone]  Fax:  [phone] Name: TMARSHON BANGSMRN: [id number] of Birth: [birth year]   PHYSICAL THERAPY DISCHARGE SUMMARY  Visits from Start of Care: 2  Current functional level related to goals / functional outcomes:   Refer to above clinical impression for status as of last visit on 06/26/2019. Patient cancelled next appointment, then no showed for the following 3 appointments. All remaining scheduled appointments were cancelled per Cx/NS policy. Patient returned to clinic wanting to resume PT and was informed of need for a new referral due to missed visits and Cx/NS policy. It has now been >30 days since patient was last seen and no new referral received, therefore will proceed with discharge from PT for this episode.   Remaining deficits:   As above. Unable to formally assess status at discharge as patient only returned for 1 treatment visit following the eval.   Education / Equipment:   HEP  Plan: Patient agrees to discharge.  Patient goals were not met. Patient is being discharged due to not returning since the last visit.  ?????     JPercival Spanish PT, MPT 07/27/19, 8:57 AM  [facility] [address]  [place], [place], [address] Phone: [phone]   Fax:  [phone]

## 2019-06-27 ENCOUNTER — Other Ambulatory Visit: Payer: Self-pay | Admitting: Family Medicine

## 2019-06-28 ENCOUNTER — Ambulatory Visit: Payer: Medicare HMO | Admitting: Physical Therapy

## 2019-07-03 ENCOUNTER — Ambulatory Visit: Payer: Medicare HMO

## 2019-07-04 ENCOUNTER — Other Ambulatory Visit: Payer: Self-pay

## 2019-07-04 ENCOUNTER — Ambulatory Visit (INDEPENDENT_AMBULATORY_CARE_PROVIDER_SITE_OTHER): Payer: Medicare HMO | Admitting: Orthopaedic Surgery

## 2019-07-04 ENCOUNTER — Encounter: Payer: Self-pay | Admitting: Orthopaedic Surgery

## 2019-07-04 DIAGNOSIS — Z96641 Presence of right artificial hip joint: Secondary | ICD-10-CM

## 2019-07-04 MED ORDER — HYDROCODONE-ACETAMINOPHEN 5-325 MG PO TABS: 1.0000 | ORAL_TABLET | Freq: Four times a day (QID) | ORAL | 0 refills | Status: DC | PRN

## 2019-07-04 NOTE — Progress Notes (Signed)
The patient is now 27 days status post a right total hip arthroplasty.  He was actually lost to follow-up and this is his first visit.  He is a gentleman who does weigh 288 pounds and is a diabetic.  He also has a below-knee amputation on his right operative side and does wear a prosthesis.  He said he is incredibly happy about his hip is done he is doing well overall.  On examination his hip incision looks good to the staples been removed.  There is no evidence of infection or any significant swelling.  His prosthesis is fitting well on the right side and I can put [place] at the range of motion with no significant discomfort at all.  He is doing so well we do not need to see him back for 3 months.  At that visit I would like a standing low AP pelvis and lateral of his right operative hip.  I will send in some hydrocodone for him.  If there is any issues before then he will let us know.  All questions and concerns were answered and addressed.

## 2019-07-05 ENCOUNTER — Ambulatory Visit: Payer: Medicare HMO

## 2019-07-10 ENCOUNTER — Ambulatory Visit: Payer: Medicare HMO | Admitting: Physical Therapy

## 2019-07-19 ENCOUNTER — Encounter: Payer: Medicare HMO | Admitting: Physical Therapy

## 2019-07-23 ENCOUNTER — Other Ambulatory Visit: Payer: Self-pay | Admitting: Family Medicine

## 2019-07-31 ENCOUNTER — Encounter: Payer: Medicare HMO | Admitting: Physical Therapy

## 2019-08-03 ENCOUNTER — Telehealth: Payer: Self-pay | Admitting: Family Medicine

## 2019-08-03 NOTE — Telephone Encounter (Signed)
Pt left VM on PCP's line requesting callback. No additional information provided. Attempted callback, no answer. Left VM to return triage call.

## 2019-08-06 ENCOUNTER — Ambulatory Visit (INDEPENDENT_AMBULATORY_CARE_PROVIDER_SITE_OTHER): Payer: Medicare HMO | Admitting: Family Medicine

## 2019-08-06 ENCOUNTER — Encounter: Payer: Self-pay | Admitting: Family Medicine

## 2019-08-06 ENCOUNTER — Other Ambulatory Visit: Payer: Self-pay

## 2019-08-06 VITALS — BP 159/99 | HR 77 | Temp 98.2°F | Wt 288.0 lb

## 2019-08-06 DIAGNOSIS — E785 Hyperlipidemia, unspecified: Secondary | ICD-10-CM

## 2019-08-06 DIAGNOSIS — M25551 Pain in right hip: Secondary | ICD-10-CM

## 2019-08-06 DIAGNOSIS — Z96641 Presence of right artificial hip joint: Secondary | ICD-10-CM | POA: Diagnosis not present

## 2019-08-06 DIAGNOSIS — G5602 Carpal tunnel syndrome, left upper limb: Secondary | ICD-10-CM | POA: Diagnosis not present

## 2019-08-06 DIAGNOSIS — Z89511 Acquired absence of right leg below knee: Secondary | ICD-10-CM | POA: Diagnosis not present

## 2019-08-06 DIAGNOSIS — M5442 Lumbago with sciatica, left side: Secondary | ICD-10-CM

## 2019-08-06 DIAGNOSIS — I10 Essential (primary) hypertension: Secondary | ICD-10-CM | POA: Diagnosis not present

## 2019-08-06 DIAGNOSIS — Z23 Encounter for immunization: Secondary | ICD-10-CM | POA: Diagnosis not present

## 2019-08-06 MED ORDER — SPIRONOLACTONE 25 MG PO TABS: 25.0000 mg | ORAL_TABLET | Freq: Every day | ORAL | 1 refills | Status: DC

## 2019-08-06 MED ORDER — LISINOPRIL-HYDROCHLOROTHIAZIDE 20-25 MG PO TABS: 1.0000 | ORAL_TABLET | Freq: Every day | ORAL | 1 refills | Status: DC

## 2019-08-06 MED ORDER — DICLOFENAC SODIUM 1 % TD GEL: 2.0000 g | Freq: Four times a day (QID) | TRANSDERMAL | 2 refills | Status: DC | PRN

## 2019-08-06 MED ORDER — ATORVASTATIN CALCIUM 20 MG PO TABS: 20.0000 mg | ORAL_TABLET | Freq: Every day | ORAL | 3 refills | Status: DC

## 2019-08-06 MED ORDER — CARVEDILOL PHOSPHATE ER 40 MG PO CP24: 40.0000 mg | ORAL_CAPSULE | Freq: Every day | ORAL | 1 refills | Status: DC

## 2019-08-06 MED ORDER — AMLODIPINE BESYLATE 10 MG PO TABS: 10.0000 mg | ORAL_TABLET | Freq: Every day | ORAL | 1 refills | Status: DC

## 2019-08-06 NOTE — Progress Notes (Signed)
Russell Garcia is a 63 y.o. male who presents to [facility] today for left hand numbness, hypertension, left leg radicular pain, hyperlipidemia.  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  He notes his right hip is feeling much better but still having some stiffness and is interested in continuing some physical therapy.  Hypertension: Burr has a history of not well controlled hypertension.  This prevented him from having surgery and ultimately was better controlled with lisinopril/hydrochlorothiazide, amlodipine, spironolactone and Coreg.  He notes he is been taking all of the medications except for Coreg.  He does not check his blood pressure.  No chest pain palpitation shortness of breath.  He notes that he has been having some left hand numbness following surgery.  He feels numbness in the tips of his thumb index finger middle finger and some of the ring finger.  Numbness is worse with activity and better with rest.  He denies any injury.  Additionally in the past he has had some low back pain radiating down the left leg.  He notes he had evaluation for this in 2017 with MRI but never did have epidural steroid injection.  He notes this is moderately bothersome as well.  Labs were checked a few months ago and LDL was elevated.  He never did get started on cholesterol-lowering medication.  Patient also thinks that he is due for repeat colonoscopy.  He thinks he had one at [facility] a few years ago. ROS as above:  Exam:  BP (!) 159/99   Pulse 77   Temp 98.2 F (36.8 C) (Oral)   Wt 288 lb (130.6 kg)   BMI 39.06 kg/m  Wt Readings from Last 5 Encounters:  08/06/19 288 lb (130.6 kg)  06/07/19 288 lb (130.6 kg)  06/05/19 288 lb 4.8 oz (130.8 kg)  05/10/19 289 lb  (131.1 kg)  05/03/19 293 lb (132.9 kg)    Gen: Well NAD HEENT: EOMI,  MMM Lungs: Normal work of breathing. CTABL Heart: RRR no MRG Abd: NABS, Soft. Nondistended, Nontender Exts: Brisk capillary refill, warm and well perfused right BKA MSK: Left wrist normal-appearing normal sensation pulses cap refill and strength.  Positive Tinel's at carpal tunnel.  Negative Phalen's test. L-spine: Normal-appearing normal motion  Lab and Radiology Results EXAM: MRI LUMBAR SPINE WITHOUT CONTRAST  TECHNIQUE: Multiplanar, multisequence MR imaging of the lumbar spine was performed. No intravenous contrast was administered.  COMPARISON:  05/14/2016  FINDINGS: Segmentation: The lowest lumbar type non-rib-bearing vertebra is labeled as L5.  Alignment:  Unremarkable  Vertebrae: Nonspecific 6 mm focus of T2 signal hyperintensity in knee left posterior L4 vertebral body near the pedicle, image 14/5, technically nonspecific although statistically likely to be a benign vascular lesion or small atypical hemangioma.  Mild disc desiccation at the T12-L1 and L4-5 levels.  Congenitally short pedicles in the lumbar spine.  Conus medullaris: Extends to the L1 level and appears normal.  Paraspinal and other soft tissues:  Bilateral renal fluid signal intensity lesions favor cysts.  Disc levels:  L1-2: Borderline bilateral foraminal narrowing due to the short pedicles.  L2-3: Mild central narrowing of the thecal sac along with mild left and borderline right foraminal stenosis due to short pedicles and mild disc bulge.  L3-4: Prominent central narrowing of the thecal sac with moderate left and mild  right foraminal stenosis due to short pedicles, disc bulge, and facet arthropathy. Cross-sectional area of the thecal sac is 0.4 cm^2. Mild displacement of the L3 nerves in the lateral extraforaminal space due to the disc bulge. Small left facet joint effusion. Small synovial cyst from  the left facet joint extends posteriorly.  L4-5: Prominent central narrowing of the thecal sac with moderate right and mild left foraminal stenosis due to disc bulge, congenitally short pedicles, and facet arthropathy. Cross-sectional area of the thecal sac 0.3 cm^2.  L5-S1: Moderate left and mild right foraminal stenosis with mild left subarticular lateral recess stenosis and borderline central narrowing of the thecal sac due to short pedicles, disc bulge, and left greater than right facet arthropathy.  IMPRESSION: 1. Lumbar congenitally short pedicles, spondylosis, and degenerative disc disease cause prominent impingement at L3-4 and L4-5; moderate impingement at L5-S1; and mild impingement at L2- 3, as detailed above. 2. 6 mm T2 hyperintense lesion in the left posterior L4 vertebral body, probably a small atypical hemangioma or similar benign vascular lesion, although technically nonspecific.   Electronically Signed   By: Van Clines M.D.   On: 05/31/2016 09:12 I personally (independently) visualized and performed the interpretation of the images attached in this note.            DIAGNOSIS:    TISSUE LABELED "HEPATIC FLEXURE":     MULTIPLE FRAGMENTS OF SESSILE SERRATED POLYP.       P305    ABO    (mhb)         Electronically Signed By: Alden Hipp.Oaks M.D. Pathologist     [facility]    (Case signed [id number] at 09:06)          ________________________________________________________________________________   _________       SPECIMEN(S):    HEPATIC FLEXURE       SPECIMEN CLINICAL INFORMATION:    RULE OUT ADENOMA       GROSS:    Received in formalin with container labeled only with the patient's name and    labeled only on the requisition sheet as "hepatic flexure" consists of two tan    irregular to polypoid soft tissue biopsies, measuring 0.8 to 1.0 cm in greatest    dimension. The smaller fragment is submitted as received  in cassette 1 with the    largest polypoid fragment transversely sectioned and totally submitted in    cassette 2. 2-1,3    DG    GRO/mhb       Reports to:    TOLEDO, TEODORO K./TKT/#[phone]        Assessment and Plan: 63 y.o. male with  Hypertension: Blood pressure not controlled.  Plan to restart Coreg.  Patient notes that twice daily is a bit obnoxious and would like to use daily Coreg.  Will use extended release 40 mg.  Continue amlodipine lisinopril/hydrochlorothiazide and spironolactone.  Recheck metabolic panel in about 2 months.  Hyperlipidemia: LDL was 172 a few months ago.  This was somewhat lost to follow-up given all of the other issues going on.  Plan to start atorvastatin and check lipid panel in about 2 months.  Left hand numbness: Carpal tunnel syndrome: Plan for cock-up wrist splint and diclofenac gel.  If not improving next step would be injection.  Lumbar radicular pain left leg.  Likely S1 dermatome.  MRI from 2017.  Plan for physical therapy if not improving next step would be epidural steroid injection.  We will go ahead and do some physical therapy for his hip as well.  He is due for colon cancer screening likely.  Based on partial records available currently in care everywhere he had colonoscopy in January 2016 by Dr. Alice Reichert at [facility] gastroenterology showing sessile polyp at hepatic flexure.  I am currently requesting more detailed medical records.  Likely will refer to gastroenterology for repeat colonoscopy.  I suspect he probably was due for 3-year follow-up in January 2019.   Flu vaccine given today.  PDMP reviewed during this encounter. Orders Placed This Encounter  Procedures  . Flu Vaccine QUAD 6+ mos PF IM (Fluarix Quad PF)  . Ambulatory referral to Physical Therapy    Referral Priority:   Routine    Referral Type:   Physical Medicine    Referral Reason:   Specialty Services Required    Requested Specialty:   Physical Therapy    Meds ordered this encounter  Medications  . carvedilol (COREG CR) 40 MG 24 hr capsule    Sig: Take 1 capsule (40 mg total) by mouth daily.    Dispense:  90 capsule    Refill:  1  . amLODipine (NORVASC) 10 MG tablet    Sig: Take 1 tablet (10 mg total) by mouth daily.    Dispense:  90 tablet    Refill:  1  . lisinopril-hydrochlorothiazide (ZESTORETIC) 20-25 MG tablet    Sig: Take 1 tablet by mouth daily.    Dispense:  90 tablet    Refill:  1  . spironolactone (ALDACTONE) 25 MG tablet    Sig: Take 1 tablet (25 mg total) by mouth daily.    Dispense:  90 tablet    Refill:  1  . diclofenac sodium (VOLTAREN) 1 % GEL    Sig: Apply 2 g topically 4 (four) times daily as needed (pain).    Dispense:  100 g    Refill:  2  . atorvastatin (LIPITOR) 20 MG tablet    Sig: Take 1 tablet (20 mg total) by mouth daily.    Dispense:  90 tablet    Refill:  3     Historical information moved to improve visibility of documentation.  Past Medical History:  Diagnosis Date  . Anxiety   . Arthritis   . Depression   . 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 Hypertension 2009   Past Surgical History:  Procedure Laterality Date  . INGUINAL HERNIA REPAIR    . R BKA    . TOTAL HIP ARTHROPLASTY Right 06/07/2019  . TOTAL HIP ARTHROPLASTY Right 06/07/2019   Procedure: RIGHT TOTAL HIP ARTHROPLASTY ANTERIOR APPROACH;  Surgeon: Mcarthur Rossetti, MD;  Location: [facility];  Service: Orthopedics;  Laterality: Right;   Social History   Tobacco Use  . Smoking status: Never Smoker  . Smokeless tobacco: Never Used  Substance Use Topics  . Alcohol use: No    Alcohol/week: 0.0 standard drinks   family history includes Heart attack (age of onset: 52) in his mother; Leukemia in his father.  Medications: Current Outpatient Medications  Medication Sig Dispense Refill  . acetaminophen (TYLENOL) 325 MG tablet Take 1-2 tablets (325-650 mg total) by mouth every 6 (six) hours as  needed for mild pain (pain score 1-3 or temp > 100.5).    [name] amLODipine (NORVASC) 10 MG tablet Take 1 tablet (10 mg total) by mouth daily. 90 tablet 1  . aspirin  EC 325 MG tablet Take 325 mg by mouth 2 (two) times daily.     . diclofenac sodium (VOLTAREN) 1 % GEL Apply 2 g topically 4 (four) times daily as needed (pain). 100 g 2  . HYDROcodone-acetaminophen (NORCO/VICODIN) 5-325 MG tablet Take 1-2 tablets by mouth every 6 (six) hours as needed for moderate pain. 40 tablet 0  . lisinopril-hydrochlorothiazide (ZESTORETIC) 20-25 MG tablet Take 1 tablet by mouth daily. 90 tablet 1  . magnesium gluconate (MAGONATE) 500 MG tablet Take 500 mg by mouth 3 (three) times a week.    . spironolactone (ALDACTONE) 25 MG tablet Take 1 tablet (25 mg total) by mouth daily. 90 tablet 1  . atorvastatin (LIPITOR) 20 MG tablet Take 1 tablet (20 mg total) by mouth daily. 90 tablet 3  . carvedilol (COREG CR) 40 MG 24 hr capsule Take 1 capsule (40 mg total) by mouth daily. 90 capsule 1   No current facility-administered medications for this visit.    No Known Allergies   Discussed warning signs or symptoms. Please see discharge instructions. Patient expresses understanding.

## 2019-08-06 NOTE — Patient Instructions (Addendum)
Thank you for coming in today. Continue medicine for blood pressure.  Start Coreg daily.  Start atrovistatin for cholesterol  Get labs in 2 months fasting.  We will send you a reminder.  I will check on your colon cancer screening and see if you are due.   For wrist use the splint at night.  If not better return for injection and then nerve test if not better.   I will be moving to full time Sports Medicine in [place] starting on November 1st.  You will still be able to see me for your Sports Medicine or Orthopedic needs at [facility] in [place]. I will still be part of [place].    If you want to stay locally for your Sports Medicine issues Dr. Dianah Field here in [place] will be happy to see you.  Additionally Dr. Clearance Coots at [facility] will be happy to see you for sports medicine issues more locally.   For your primary care needs you are welcome to establish care with Dr. Emeterio Reeve.  Dr Luetta Nutting will be starting in the new year and a new NP Caryl Asp will be starting in December.  We are working quickly to hire more physicians to cover the primary care needs however if you cannot get an appointment with Dr. Sheppard Coil in a timely manner [facility] has locations and openings for primary care services nearby.   [facility] at [facility] [address] . [place] , [place]: [phone] . Behavioral Medicine: [phone] . Fax: [place] at [employer] [address] . [place], [place]: [phone] . Behavioral Medicine: [phone] . Fax: [phone] . Hours (M-F): 7am - Academic librarian At [facility]. [place], [place]: [phone] . Behavioral Medicine: [phone] . Fax: [phone] . Hours (M-F): 8am - Optician, dispensing at [employer]  . [place], [place] Phone: [phone] . Behavioral Medicine: [phone] . Fax: [phone]

## 2019-08-14 ENCOUNTER — Encounter: Payer: Self-pay | Admitting: Family Medicine

## 2019-08-14 ENCOUNTER — Telehealth: Payer: Self-pay | Admitting: Family Medicine

## 2019-08-14 NOTE — Telephone Encounter (Signed)
Received detailed colonoscopy report and pathology from [facility] Colonoscopy with polypectomy date of service 11/22/14. Findings. No diverticulosis of the colon 1 cm sessile polyp at 90 cm at hepatic flexure Small internal hemorrhoids present  We will send to abstract and try to contact the gastroenterology office for more detailed pathology as well as follow-up plan.  Suspected would probably be 5 years but do not know for sure based on provided information.

## 2019-08-23 ENCOUNTER — Encounter: Payer: Self-pay | Admitting: Physical Therapy

## 2019-08-23 ENCOUNTER — Ambulatory Visit: Payer: Medicare HMO | Attending: Family Medicine | Admitting: Physical Therapy

## 2019-08-23 ENCOUNTER — Other Ambulatory Visit: Payer: Self-pay

## 2019-08-23 VITALS — BP 122/72 | HR 95

## 2019-08-23 DIAGNOSIS — R29898 Other symptoms and signs involving the musculoskeletal system: Secondary | ICD-10-CM | POA: Diagnosis present

## 2019-08-23 DIAGNOSIS — G8929 Other chronic pain: Secondary | ICD-10-CM | POA: Diagnosis present

## 2019-08-23 DIAGNOSIS — R262 Difficulty in walking, not elsewhere classified: Secondary | ICD-10-CM

## 2019-08-23 DIAGNOSIS — M5442 Lumbago with sciatica, left side: Secondary | ICD-10-CM | POA: Insufficient documentation

## 2019-08-23 NOTE — Therapy (Addendum)
[facility] [address]  [place], [place], [address] Phone: [phone]   Fax:  [phone]  Physical Therapy Evaluation  Patient Details  Name: Russell Garcia MRN: [id number] Date of Birth: Mar 11, 1956 Referring Provider (PT): Lynne Leader, MD    Progress Note Reporting Period 08/23/19 to 08/23/19  See note below for Objective Data and Assessment of Progress/Goals.    Encounter Date: 08/23/2019  PT End of Session - 08/23/19 1748    Visit Number  1    Number of Visits  7    Date for PT Re-Evaluation  10/04/19    Authorization Type  [employer] Medicare    PT Start Time  1702    PT Stop Time  1742    PT Time Calculation (min)  40 min    Activity Tolerance  Patient tolerated treatment well    Behavior During Therapy  WFL for tasks assessed/performed       Past Medical History:  Diagnosis Date  . Anxiety   . Arthritis   . Depression   . 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 Hypertension 2009    Past Surgical History:  Procedure Laterality Date  . INGUINAL HERNIA REPAIR    . R BKA    . TOTAL HIP ARTHROPLASTY Right 06/07/2019  . TOTAL HIP ARTHROPLASTY Right 06/07/2019   Procedure: RIGHT TOTAL HIP ARTHROPLASTY ANTERIOR APPROACH;  Surgeon: Mcarthur Rossetti, MD;  Location: [facility];  Service: Orthopedics;  Laterality: Right;    Vitals:   08/23/19 1703  BP: 122/72  Pulse: 95  SpO2: 94%     Subjective Assessment - 08/23/19 1705    Subjective  Patient reports that he has been having back problems for 5-6 years. Notices that his L leg goes numb from prolonged walking or standing. Pain and N/T starts from midline of LB with radiation along lateral thigh down to toes. Intermittently gets buckling in his L LE. Better with sitting. Underwent R THA in August and feels good ad far as his hip goes. Also reporting N/T in L digits 1-4. Worse when opening a bottle or carrying something, but  is there constantly. Denies a lot of work on the computer but used to do a lot of work with his hands. L hand N/T started after his hip surgery in August. Would like to be able to walk 1 hour and work out for longer.    Pertinent History  HTN, R BKA, depression, anxiety, R THA 06/07/19    Limitations  Lifting;Standing;Walking;House hold activities    How long can you sit comfortably?  unlimited    How long can you stand comfortably?  15-20 min    How long can you walk comfortably?  10-15 min    Diagnostic tests  05/31/2016 lumbar MRI: Lumbar congenitally short pedicles, spondylosis, and DDD  cause prominent impingement at L3-4 and L4-5; moderate impingement at L5-S1; and mild impingement at L2- 3, as detailed above; probable small atypical hemiangioma in L L4 vertebral body    Patient Stated Goals  "get some relief in my LB so that i can walk and workout a little bit longer"    Currently in Pain?  Yes    Pain Score  0-No pain    Pain Location  Back    Pain Orientation  Left;Lower    Pain Descriptors / Indicators  Tingling;Numbness    Pain Type  Chronic pain         [facility] PT Assessment - 08/23/19 1714      Assessment   Medical Diagnosis  R hip pain, acute L sided LBP with L sided sciatica, CTS of L wrist    Referring Provider (PT)  Lynne Leader, MD    Onset Date/Surgical Date  --   5-6 years   Hand Dominance  Right    Next MD Visit  not scheduled    Prior Therapy  yes for R THA      Precautions   Precautions  Anterior Hip   R BKA     Balance Screen   Has the patient fallen in the past 6 months  No    Has the patient had a decrease in activity level because of a fear of falling?   No    Is the patient reluctant to leave their home because of a fear of falling?   No      Home Environment   Living Environment  Private residence    Available Help at Discharge  [place] to enter    Entrance Stairs-Number of Steps  2    [place]  One level    [place] - single point;Walker - 2 wheels      Prior Function   Level of Independence  Independent    Vocation  Retired    Leisure  working out- Medical illustrator   Overall Cognitive Status  Within [employer] for tasks assessed      Praxair  --   N/T in L finger tips and L LE     Coordination   Gross Motor Movements are Fluid and Coordinated  Yes      Posture/Postural Control   Posture/Postural Control  Postural limitations    Postural Limitations  Weight shift left;Rounded Shoulders      ROM / Strength   AROM / PROM / Strength  AROM;Strength      AROM   AROM Assessment Site  Lumbar    Lumbar Flexion  toes    Lumbar Extension  moderately limited   N/T in L posterior calf   Lumbar - Right Side Bend  distal thigh    Lumbar - Left Side Bend  jt line    Lumbar - Right Rotation  mildly limited    Lumbar - Left Rotation  WNL      Strength   Strength Assessment Site  Hip;Knee;Ankle    Right/Left Hip  Right;Left    Right Hip Flexion  5/5    Right Hip ABduction  5/5    Right Hip ADduction  5/5    Left Hip Flexion  4+/5    Left Hip ABduction  5/5    Left Hip ADduction  5/5    Right/Left Knee  Right;Left    Right Knee Flexion  5/5    Right Knee Extension  5/5    Left Knee Flexion  5/5    Left Knee Extension  5/5    Right/Left Ankle  Left    Left Ankle Dorsiflexion  5/5    Left Ankle Plantar Flexion  5/5      Flexibility   Soft Tissue Assessment /Muscle Length  yes    Hamstrings  B WFL    Quadriceps  L WNL    Piriformis  L moderately tight      Palpation   Spinal mobility  c/o N/T with gentle central PAs over L2 and L4    Palpation comment  c/o N/T in L LE with palpation over central spinous process of L2; increased soft tissue restriction in L buttocks and QL      Ambulation/Gait   Assistive device  None    Gait Pattern  Step-through pattern;Decreased weight shift to right;Right  circumduction;Antalgic;Decreased step length - right;Lateral trunk lean to left    Ambulation Surface  Level;Indoor                Objective measurements completed on examination: See above findings.              PT Education - 08/23/19 1748    Education Details  prognosis, POC, HEP    Person(s) Educated  Patient    Methods  Explanation;Demonstration;Tactile cues;Verbal cues;Handout    Comprehension  Verbalized understanding;Returned demonstration       PT Short Term Goals - 08/23/19 1759      PT SHORT TERM GOAL #1   Title  Patient will be independent with initial HEP    Time  3    Period  Weeks    Status  New    Target Date  09/13/19        PT Long Term Goals - 08/23/19 1800      PT LONG TERM GOAL #1   Title  Patient will be independent with ongoing/advanced HEP    Time  6    Period  Weeks    Status  New    Target Date  10/04/19      PT LONG TERM GOAL #2   Title  Patient to demonstrate lumbar AROM WFL without c/o pain or N/T.    Time  6    Period  Weeks    Status  New    Target Date  10/04/19      PT LONG TERM GOAL #3   Title  Patient to demonstrate mild tightness in L piriformis.    Time  6    Period  Weeks    Status  New    Target Date  10/04/19      PT LONG TERM GOAL #4   Title  Patient to report tolerance of 1 hour of walking without pain or N/T limiting.    Time  6    Period  Weeks    Status  New    Target Date  10/04/19             Plan - 08/23/19 1752    Clinical Impression Statement  Patient is a 63y/o M presenting to OPPT with c/o chronic midline LBP with radiation down L lateral thigh and into toes for 5-6 years. Pain and N/T occurs worse with prolonged standing or walking. Notes intermittent L LE buckling but denies falls. Better with sitting. Also noting N/T in digits 1-4 on L hand, but requesting focus on LBP. Patient presented with good overall LE strength, decreased lumbar AROM, tightness in L piriformis, N/T with  gentle central PAs over L2 and L4, and gait deviations. Patient educated on stretching and core stability HEP and reported understanding. Would benefit from skilled PT services 1x/week for 6 weeks to address aforementioned impairments.    Personal Factors and Comorbidities  Age;Comorbidity 3+;Time since onset of injury/illness/exacerbation;Fitness;Past/Current Experience    Comorbidities  HTN, R BKA, depression, anxiety, R THA 06/07/19  Examination-Activity Limitations  Bend;Stairs;Carry;Stand;Hygiene/Grooming;Lift;Locomotion Level;Reach Overhead    Examination-Participation Restrictions  Church;Cleaning;Shop;Community Activity;Driving;Yard Work;Interpersonal Relationship;Laundry;Meal Prep    Stability/Clinical Decision Making  Evolving/Moderate complexity    Clinical Decision Making  Moderate    Rehab Potential  Good    PT Frequency  1x / week    PT Duration  6 weeks    PT Treatment/Interventions  ADLs/Self Care Home Management;Cryotherapy;Electrical Stimulation;Moist Heat;Traction;Balance training;Therapeutic exercise;Therapeutic activities;Functional mobility training;Stair training;Gait training;Ultrasound;Neuromuscular re-education;Patient/family education;Orthotic Fit/Training;Manual techniques;Taping;Energy conservation;Dry needling;Passive range of motion    PT Next Visit Plan  reassess HEP    Consulted and Agree with Plan of Care  Patient       Patient will benefit from skilled therapeutic intervention in order to improve the following deficits and impairments:  Abnormal gait, Decreased activity tolerance, Decreased balance, Decreased range of motion, Decreased safety awareness, Decreased strength, Difficulty walking, Impaired flexibility, Pain, Hypomobility, Increased muscle spasms, Improper body mechanics, Postural dysfunction  Visit Diagnosis: Chronic midline low back pain with left-sided sciatica  Difficulty in walking, not elsewhere classified  Other symptoms and signs  involving the musculoskeletal system     Problem List Patient Active Problem List   Diagnosis Date Noted  . Carpal tunnel syndrome of left wrist 08/06/2019  . Status post total replacement of right hip 06/07/2019  . Right hip pain 05/14/2016  . Left lumbar radiculopathy 05/14/2016  . Dermatitis 03/09/2011  . Hyperlipidemia 03/09/2011  . NUMBNESS 05/16/2010  . 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 HTN (hypertension) 07/31/2008  . Morbid obesity ([place]) 08/22/2007  . HERNIA, HX OF 08/16/2007  . 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  [place], [place], [address] Phone: [phone]   Fax:  [phone]  Name: Russell Garcia MRN: [id number] Date of Birth: 1956/08/07    PHYSICAL THERAPY DISCHARGE SUMMARY  Visits from Start of Care: 1  Current functional level related to goals / functional outcomes: Unable to assess; patient did not return   Remaining deficits: See above   Education / Equipment: HEP  Plan: Patient agrees to discharge.  Patient goals were not met. Patient is being discharged due to not returning since the last visit.  ?????     Janene Harvey, PT, DPT 10/03/19 3:20 PM

## 2019-08-29 ENCOUNTER — Ambulatory Visit: Payer: Medicare HMO | Attending: Family Medicine | Admitting: Physical Therapy

## 2019-09-03 ENCOUNTER — Encounter: Payer: Self-pay | Admitting: Family Medicine

## 2019-09-06 ENCOUNTER — Ambulatory Visit: Payer: Medicare HMO | Admitting: Physical Therapy

## 2019-09-13 ENCOUNTER — Ambulatory Visit: Payer: Medicare HMO

## 2019-09-19 ENCOUNTER — Ambulatory Visit: Payer: Medicare HMO | Admitting: Physical Therapy

## 2019-09-27 ENCOUNTER — Ambulatory Visit: Payer: Medicare HMO

## 2019-10-03 ENCOUNTER — Encounter: Payer: Self-pay | Admitting: Orthopaedic Surgery

## 2019-10-03 ENCOUNTER — Ambulatory Visit (INDEPENDENT_AMBULATORY_CARE_PROVIDER_SITE_OTHER): Payer: Medicare HMO

## 2019-10-03 ENCOUNTER — Ambulatory Visit (INDEPENDENT_AMBULATORY_CARE_PROVIDER_SITE_OTHER): Payer: Medicare HMO | Admitting: Orthopaedic Surgery

## 2019-10-03 ENCOUNTER — Other Ambulatory Visit: Payer: Self-pay

## 2019-10-03 DIAGNOSIS — Z96641 Presence of right artificial hip joint: Secondary | ICD-10-CM

## 2019-10-03 NOTE — Progress Notes (Signed)
The patient comes in today 119 days status post a right total hip arthroplasty.  He is very happy with his hip.  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  He feels good overall.  On exam I can easily put his right hip through internal extra rotation with no pain at all.  He feels like he is negative left hip.  A low AP pelvis and lateral the right hip show well-seated total hip arthroplasty with no complicating features.  From my standpoint I do not need to see him back for 6 months.  I would like a low AP pelvis at that visit and a lateral of his right operative hip.  He is seeing Dr. Sharol Given soon.  I have told him he should ask Dr. Sharol Given about his right lower extremity prosthesis and whether or not he needs this change in any way given his hip replacement.

## 2019-10-04 ENCOUNTER — Ambulatory Visit: Payer: Medicare HMO | Admitting: Physical Therapy

## 2019-10-08 ENCOUNTER — Encounter: Payer: Self-pay | Admitting: Orthopedic Surgery

## 2019-10-08 ENCOUNTER — Telehealth: Payer: Self-pay

## 2019-10-08 ENCOUNTER — Ambulatory Visit: Payer: Medicare HMO | Admitting: Orthopedic Surgery

## 2019-10-08 ENCOUNTER — Other Ambulatory Visit: Payer: Self-pay

## 2019-10-08 VITALS — Ht 72.0 in | Wt 288.0 lb

## 2019-10-08 DIAGNOSIS — Z125 Encounter for screening for malignant neoplasm of prostate: Secondary | ICD-10-CM

## 2019-10-08 DIAGNOSIS — Z89511 Acquired absence of right leg below knee: Secondary | ICD-10-CM | POA: Diagnosis not present

## 2019-10-08 DIAGNOSIS — E785 Hyperlipidemia, unspecified: Secondary | ICD-10-CM

## 2019-10-08 DIAGNOSIS — Z5181 Encounter for therapeutic drug level monitoring: Secondary | ICD-10-CM

## 2019-10-08 DIAGNOSIS — I10 Essential (primary) hypertension: Secondary | ICD-10-CM

## 2019-10-08 NOTE — Telephone Encounter (Signed)
Labs ordered under covering provider. Called and left pt msg advising he is due for labs and to have those done fasting. Also noted pt needs follow up to continue with medications

## 2019-10-08 NOTE — Telephone Encounter (Signed)
-----   Message from Gregor Hams, MD sent at 08/06/2019 12:09 PM EDT ----- Regarding: Patient needs to get fasting labs Recheck labs.  Please add CBC, CMP, lipid panel, PSA. Labs should be done about 2 months from October 12.  Contact patient inform him to get fasting labs.  Indication: CKD, hyperlipidemia, screen prostate cancer, anemia unspecified.  Evan  Note written October 12 to be sent on December 12.

## 2019-10-08 NOTE — Progress Notes (Signed)
Office Visit Note   Patient: Russell Garcia           Date of Birth: 20-Apr-1956           MRN: [id number] Visit Date: 10/08/2019              Requested by: Gregor Hams, MD [address] [place],  [place] [id number] PCP: Gregor Hams, MD  Chief Complaint  Patient presents with  . 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  He also recently had a right hip replacement he is in need of new liners and a new sleeve.  His sleeve is ripped and the liners have broken down.  He is also wondering if anything can be done about the length of this prosthetic because of his new hip he has about a 2 cm leg length discrepancy  Assessment & Plan: Visit Diagnoses: No diagnosis found.  Plan: He was given a prescription with regards to the liners and sleeves as well as a dressing if anything can be done about the leg length discrepancy  Follow-Up Instructions: No follow-ups on file.   Ortho Exam  Patient is alert, oriented, no adenopathy, well-dressed, normal affect, normal respiratory effort. Right lower extremity: Prosthetic is loose even with a 5 ply liner.  Sleep is stretched out and ripped.  He does have approximately 3 to 4 cm leg length discrepancy with the right being longer.  Imaging: No results found. No images are attached to the encounter.  Labs: Lab Results  Component Value Date   LABORGA NO GROWTH 09/22/2012     No results found for: ALBUMIN, PREALBUMIN, LABURIC  No results found for: MG No results found for: VD25OH  No results found for: PREALBUMIN CBC EXTENDED Latest Ref Rng & Units 06/08/2019 06/07/2019 04/19/2019  WBC 4.0 - 10.5 K/uL 10.6(H) 5.6 5.1  RBC 4.22 - 5.81 MIL/uL 4.32 5.00 5.02  HGB 13.0 - 17.0 g/dL 11.5(L) 13.4 13.3  HCT 39.0 - 52.0 % 36.5(L) 41.9 42.6  PLT 150 - 400 K/uL 153 180 176  NEUTROABS 1.4 -  7.7 K/uL - - -  LYMPHSABS 0.7 - 4.0 K/uL - - -     Body mass index is 39.06 kg/m.  Orders:  No orders of the defined types were placed in this encounter.  No orders of the defined types were placed in this encounter.    Procedures: No procedures performed  Clinical Data: No additional findings.  ROS:  All other systems negative, except as noted in the HPI. Review of Systems  Objective: Vital Signs: Ht 6' (1.829 m)   Wt 288 lb (130.6 kg)   BMI 39.06 kg/m   Specialty Comments:  No specialty comments available.  PMFS History: Patient Active Problem List   Diagnosis Date Noted  . Carpal tunnel syndrome of left wrist 08/06/2019  . Status post total replacement of right hip 06/07/2019  . Right hip pain 05/14/2016  . Left lumbar radiculopathy 05/14/2016  . Dermatitis 03/09/2011  . Hyperlipidemia 03/09/2011  . NUMBNESS 05/16/2010  . 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 HTN (hypertension) 07/31/2008  . Morbid obesity ([facility]) 08/22/2007  . HERNIA, HX OF 08/16/2007  . Status post below-knee amputation ([facility]) 08/16/2007   Past Medical History:  Diagnosis Date  . Anxiety   .  Arthritis   . Depression   . 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 Hypertension 2009    Family History  Problem Relation Age of Onset  . Leukemia Father   . Heart attack Mother 57  . Diabetes Neg Hx   . Colon cancer Neg Hx   . Prostate cancer Neg Hx     Past Surgical History:  Procedure Laterality Date  . INGUINAL HERNIA REPAIR    . R BKA    . TOTAL HIP ARTHROPLASTY Right 06/07/2019  . TOTAL HIP ARTHROPLASTY Right 06/07/2019   Procedure: RIGHT TOTAL HIP ARTHROPLASTY ANTERIOR APPROACH;  Surgeon: Mcarthur Rossetti, MD;  Location: [facility];  Service: Orthopedics;  Laterality: Right;   Social History   Occupational History  . Occupation: Curator  Tobacco Use  . Smoking status: Never Smoker  . Smokeless tobacco: Never Used  Substance and  Sexual Activity  . Alcohol use: No    Alcohol/week: 0.0 standard drinks  . Drug use: No  . Sexual activity: Yes    Partners: Female

## 2019-12-31 IMAGING — DX PORTABLE PELVIS 1-2 VIEWS
1 series · 1 of 1 positions shown · non-contrast
Comparison: Intraoperative radiographs from the same date.

CLINICAL DATA: Post total right hip arthroplasty.

EXAM:
PORTABLE PELVIS 1-2 VIEWS

[pelvis]
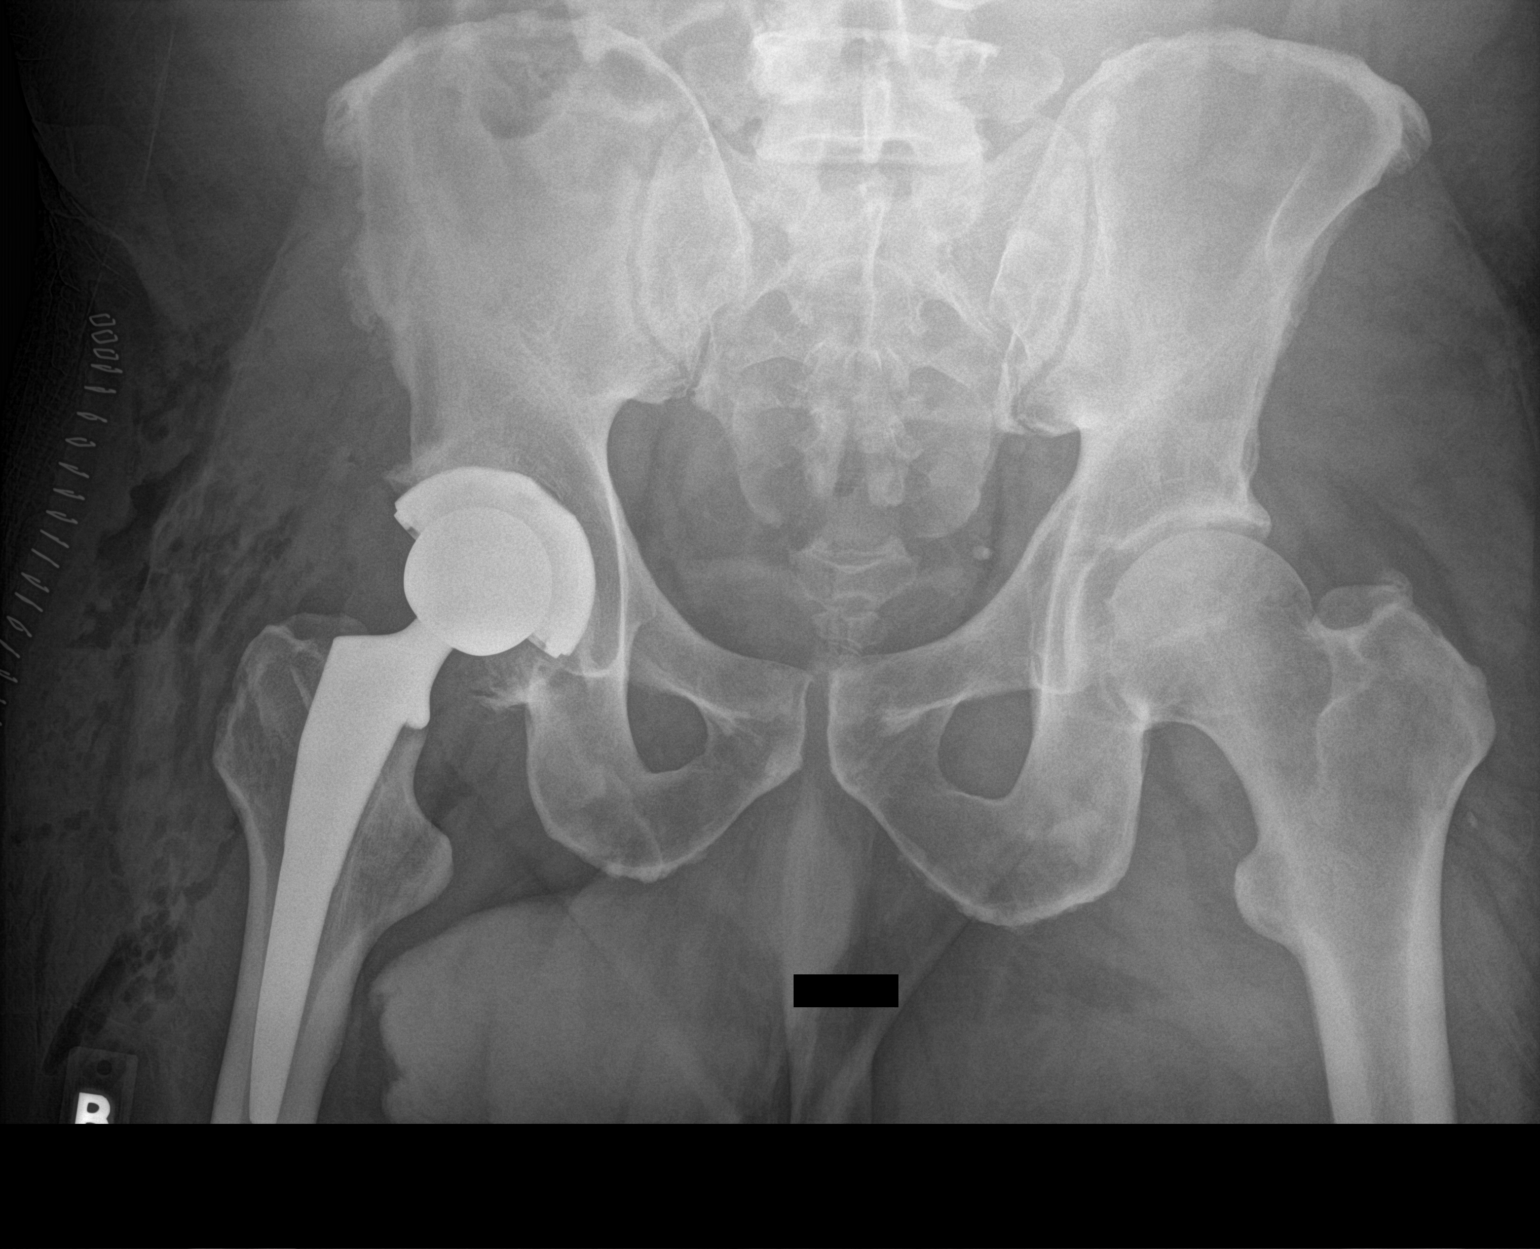

[1 of 1 positions shown; findings below may reference images not displayed]

FINDINGS: Post total right hip arthroplasty with normal alignment of the
prosthetic components. No evidence of fracture. Expected soft tissue
edema and emphysema.
IMPRESSION: Post total right hip arthroplasty without complicating features.

## 2019-12-31 IMAGING — RF OPERATIVE RIGHT HIP WITH PELVIS
1 series · 2 of 2 positions shown · non-contrast
Comparison: Preoperative radiograph 12/13/2018

CLINICAL DATA: Elective surgery.

EXAM:
DG C-ARM 61-120 MIN; OPERATIVE RIGHT HIP WITH PELVIS

[Series 1: unknown protocol · 0.20mm/px · 2 of 2 slices shown]
[im 1/2]
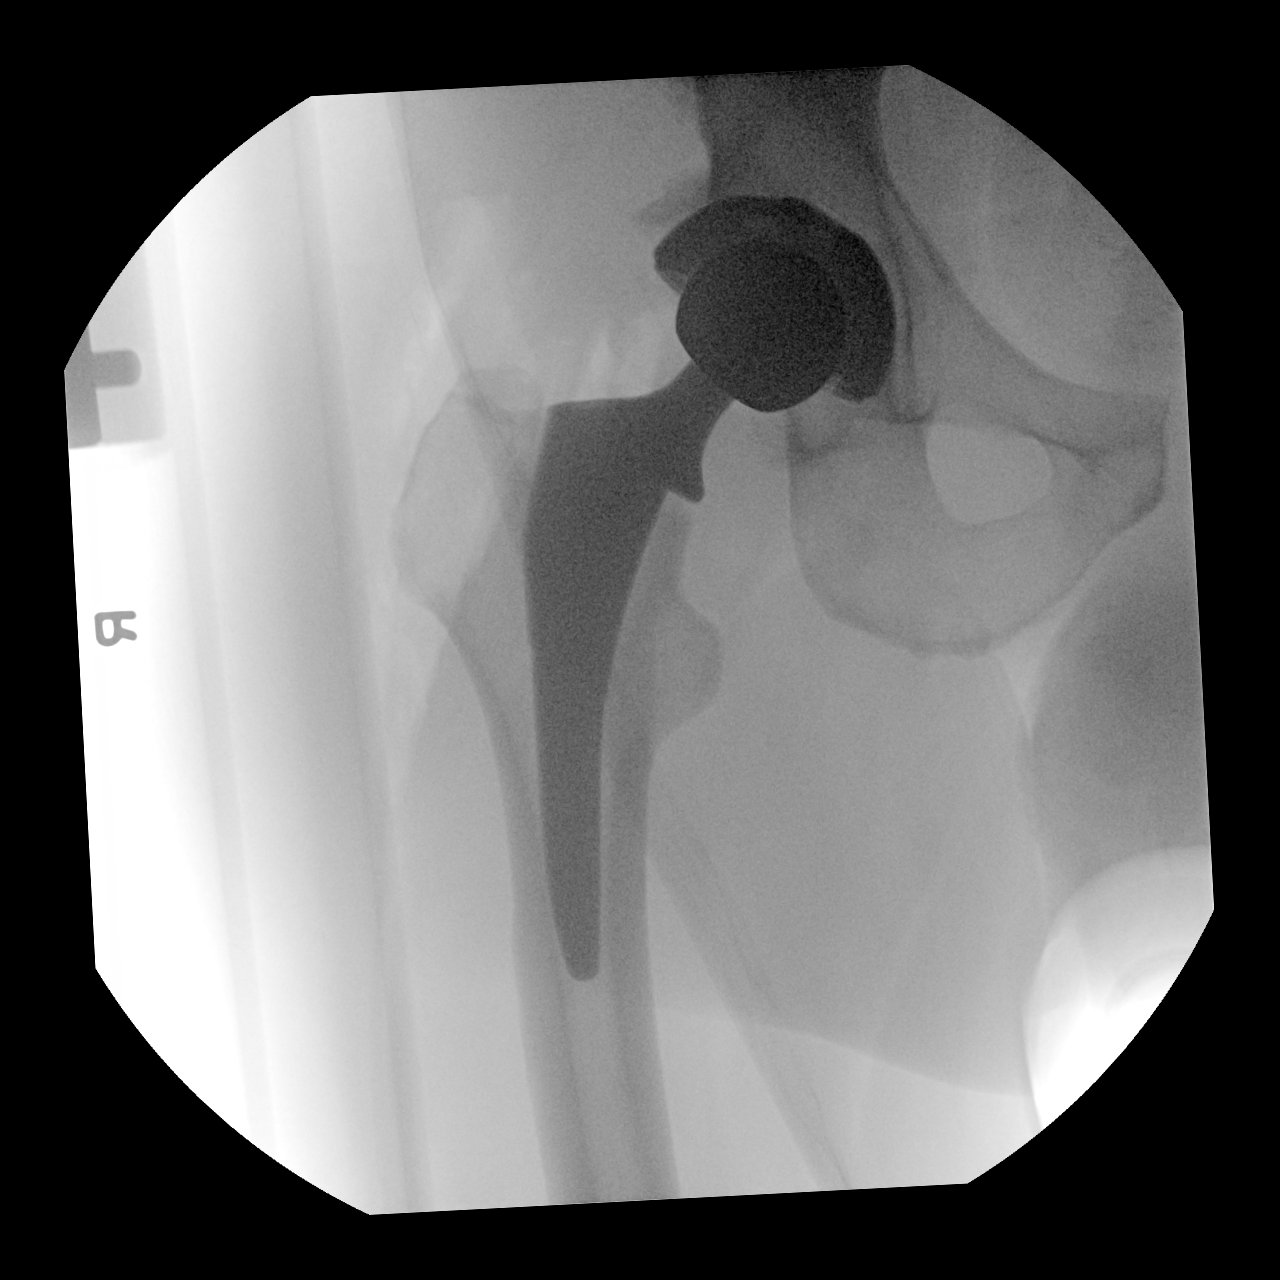
[im 2/2]
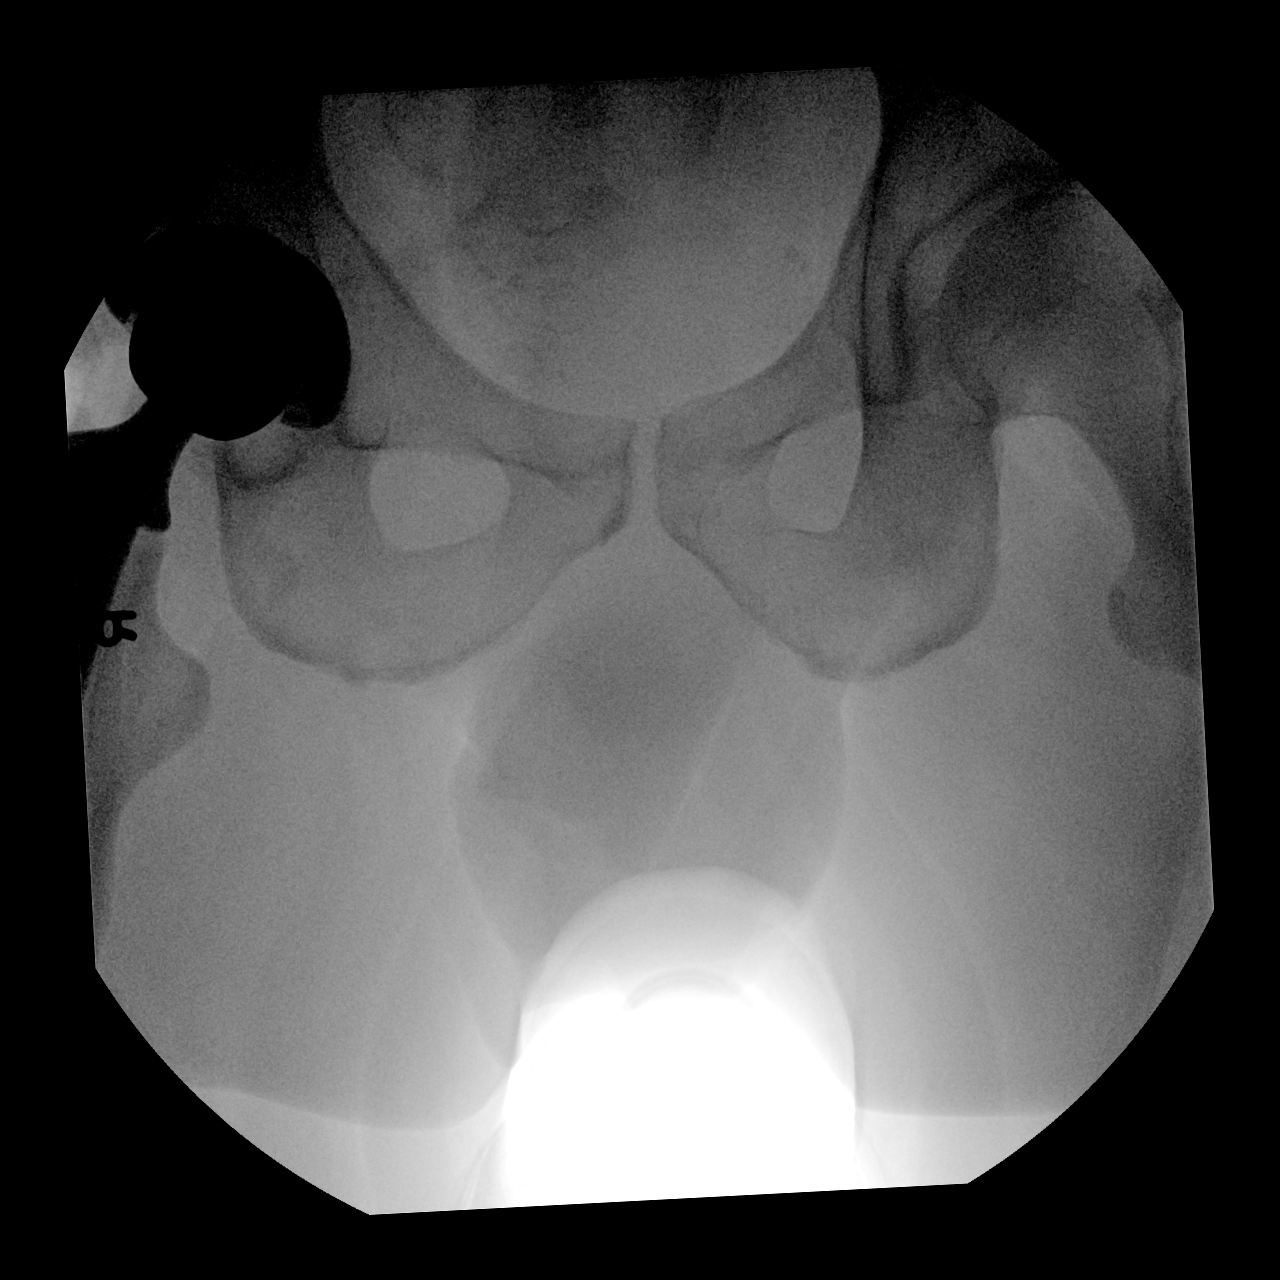

[2 of 2 positions shown; findings below may reference images not displayed]

FINDINGS: Two fluoroscopic spot images obtained in the operating room after
right hip total arthroplasty. Fluoroscopy time not reported.
IMPRESSION: Fluoroscopic spot views after right hip total arthroplasty.

## 2020-01-03 DIAGNOSIS — Z89511 Acquired absence of right leg below knee: Secondary | ICD-10-CM | POA: Diagnosis not present

## 2020-03-03 ENCOUNTER — Telehealth: Payer: Self-pay | Admitting: Family Medicine

## 2020-03-03 ENCOUNTER — Other Ambulatory Visit: Payer: Self-pay

## 2020-03-03 MED ORDER — CARVEDILOL PHOSPHATE ER 40 MG PO CP24: 40.0000 mg | ORAL_CAPSULE | Freq: Every day | ORAL | 0 refills | Status: DC

## 2020-03-03 MED ORDER — ATORVASTATIN CALCIUM 20 MG PO TABS: 20.0000 mg | ORAL_TABLET | Freq: Every day | ORAL | 0 refills | Status: DC

## 2020-03-03 MED ORDER — SPIRONOLACTONE 25 MG PO TABS: 25.0000 mg | ORAL_TABLET | Freq: Every day | ORAL | 0 refills | Status: DC

## 2020-03-03 MED ORDER — AMLODIPINE BESYLATE 10 MG PO TABS: 10.0000 mg | ORAL_TABLET | Freq: Every day | ORAL | 0 refills | Status: DC

## 2020-03-03 MED ORDER — LISINOPRIL-HYDROCHLOROTHIAZIDE 20-25 MG PO TABS: 1.0000 | ORAL_TABLET | Freq: Every day | ORAL | 0 refills | Status: DC

## 2020-03-03 NOTE — Telephone Encounter (Signed)
Russell Garcia called this afternoon to schedule an appt. To transfer care.I scheduled him for this Friday at 1030. He stated that he is out of all of his meds. He asked if they could be called in. Pharmacy on file is correct.

## 2020-03-07 ENCOUNTER — Ambulatory Visit: Payer: Medicare HMO | Admitting: Family Medicine

## 2020-03-10 ENCOUNTER — Encounter: Payer: Self-pay | Admitting: Family Medicine

## 2020-03-10 ENCOUNTER — Ambulatory Visit (INDEPENDENT_AMBULATORY_CARE_PROVIDER_SITE_OTHER): Payer: Medicare HMO | Admitting: Family Medicine

## 2020-03-10 ENCOUNTER — Other Ambulatory Visit: Payer: Self-pay

## 2020-03-10 VITALS — BP 169/110 | HR 73 | Temp 97.6°F | Ht 72.05 in | Wt 298.6 lb

## 2020-03-10 DIAGNOSIS — E785 Hyperlipidemia, unspecified: Secondary | ICD-10-CM

## 2020-03-10 DIAGNOSIS — I10 Essential (primary) hypertension: Secondary | ICD-10-CM | POA: Diagnosis not present

## 2020-03-10 DIAGNOSIS — M48062 Spinal stenosis, lumbar region with neurogenic claudication: Secondary | ICD-10-CM | POA: Diagnosis not present

## 2020-03-10 DIAGNOSIS — G9519 Other vascular myelopathies: Secondary | ICD-10-CM

## 2020-03-10 LAB — COMPLETE METABOLIC PANEL WITH GFR
AG Ratio: 1.6 (calc) (ref 1.0–2.5)
ALT: 16 U/L (ref 9–46)
AST: 18 U/L (ref 10–35)
Albumin: 4.6 g/dL (ref 3.6–5.1)
Alkaline phosphatase (APISO): 56 U/L (ref 35–144)
BUN: 14 mg/dL (ref 7–25)
CO2: 28 mmol/L (ref 20–32)
Calcium: 10 mg/dL (ref 8.6–10.3)
Chloride: 103 mmol/L (ref 98–110)
Creat: 1.19 mg/dL (ref 0.70–1.25)
GFR, Est African American: 75 mL/min/{1.73_m2} (ref >=60)
GFR, Est Non African American: 65 mL/min/{1.73_m2} (ref >=60)
Globulin: 2.8 g/dL (calc) (ref 1.9–3.7)
Glucose, Bld: 92 mg/dL (ref 65–99)
Potassium: 4.1 mmol/L (ref 3.5–5.3)
Sodium: 139 mmol/L (ref 135–146)
Total Bilirubin: 0.5 mg/dL (ref 0.2–1.2)
Total Protein: 7.4 g/dL (ref 6.1–8.1)

## 2020-03-10 LAB — CBC
HCT: 41.7 % (ref 38.5–50.0)
Hemoglobin: 13.3 g/dL (ref 13.2–17.1)
MCH: 26.5 pg — ABNORMAL LOW (ref 27.0–33.0)
MCHC: 31.9 g/dL — ABNORMAL LOW (ref 32.0–36.0)
MCV: 83.1 fL (ref 80.0–100.0)
MPV: 12.5 fL (ref 7.5–12.5)
Platelets: 182 10*3/uL (ref 140–400)
RBC: 5.02 10*6/uL (ref 4.20–5.80)
RDW: 13 % (ref 11.0–15.0)
WBC: 4.9 10*3/uL (ref 3.8–10.8)

## 2020-03-10 LAB — LIPID PANEL
Cholesterol: 217 mg/dL — ABNORMAL HIGH (ref <200)
HDL: 51 mg/dL (ref >=40)
LDL Cholesterol (Calc): 144 mg/dL (calc) — ABNORMAL HIGH
Non-HDL Cholesterol (Calc): 166 mg/dL (calc) — ABNORMAL HIGH (ref <130)
Total CHOL/HDL Ratio: 4.3 (calc) (ref <5.0)
Triglycerides: 103 mg/dL (ref <150)

## 2020-03-10 MED ORDER — LISINOPRIL-HYDROCHLOROTHIAZIDE 20-25 MG PO TABS: 1.0000 | ORAL_TABLET | Freq: Every day | ORAL | 2 refills | Status: DC

## 2020-03-10 MED ORDER — ATORVASTATIN CALCIUM 20 MG PO TABS: 20.0000 mg | ORAL_TABLET | Freq: Every day | ORAL | 2 refills | Status: DC

## 2020-03-10 MED ORDER — CARVEDILOL 12.5 MG PO TABS: 12.5000 mg | ORAL_TABLET | Freq: Two times a day (BID) | ORAL | 2 refills | Status: DC

## 2020-03-10 MED ORDER — SPIRONOLACTONE 25 MG PO TABS: 25.0000 mg | ORAL_TABLET | Freq: Every day | ORAL | 2 refills | Status: DC

## 2020-03-10 MED ORDER — AMLODIPINE BESYLATE 10 MG PO TABS: 10.0000 mg | ORAL_TABLET | Freq: Every day | ORAL | 2 refills | Status: DC

## 2020-03-10 NOTE — Patient Instructions (Addendum)
Great to meet you today! Please have labs completed today.  I have entered referral to spine surgeon, they should contact you to set up appt.  See me again in about 6 months.

## 2020-03-10 NOTE — Progress Notes (Signed)
Russell Garcia - 64 y.o. male MRN [id number]  Date of birth: 1956/07/06  Subjective Chief Complaint  Patient presents with  . Follow-up    HPI Russell Garcia is a 64 y.o. male with history of HTN, HLD, R BKA and recent R hip replacement here today for follow up visit.   He also has complaint of back pain.    -HTN:  Current management with amlodipine, coreg, aldactone, and lisinopril/hctz.  He is doing well with current medications.  Despite current medications BP is elevated today.  Reports this is often the case in clinic.  BP is better controlled at home.  Denies side effects of medication or symptoms related to HTN including chest pain, shortness of breath, palpitations, headache or vision changes.    -Back pain:  Reports increased symptoms of numbness and fatigue in L when walking.  Can make it about 1.5 blocks before having numbness and fatigue.  If leaning forward this improves symptoms.  Can walk on treadmill leaned over for several minutes without any issues.  He had MRI in 2017 with impingement at l3-l4 and l4-l5.  HE would like to get back to exercising more but this is hindering him significantly.   No Known Allergies  Past Medical History:  Diagnosis Date  . Anxiety   . Arthritis   . Depression   . 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 Hypertension 2009    Past Surgical History:  Procedure Laterality Date  . INGUINAL HERNIA REPAIR    . R BKA    . TOTAL HIP ARTHROPLASTY Right 06/07/2019  . TOTAL HIP ARTHROPLASTY Right 06/07/2019   Procedure: RIGHT TOTAL HIP ARTHROPLASTY ANTERIOR APPROACH;  Surgeon: Mcarthur Rossetti, MD;  Location: [facility];  Service: Orthopedics;  Laterality: Right;    Social History   Socioeconomic History  . Marital status: Divorced    Spouse name: Not on file  . Number of children: 4  . Years of education: Not on file  . Highest education level: Not on file  Occupational History  . Occupation: Curator  Tobacco Use   . Smoking status: Never Smoker  . Smokeless tobacco: Never Used  Substance and Sexual Activity  . Alcohol use: No    Alcohol/week: 0.0 standard drinks  . Drug use: No  . Sexual activity: Yes    Partners: Female  Other Topics Concern  . Not on file  Social History Narrative            Social Determinants of Health   Financial Resource Strain:   . Difficulty of Paying Living Expenses:   Food Insecurity:   . Worried About Charity fundraiser in the Last Year:   . Arboriculturist in the Last Year:   Transportation Needs:   . Film/video editor (Medical):   [name] Lack of Transportation (Non-Medical):   Physical Activity:   . Days of Exercise per Week:   . Minutes of Exercise per Session:   Stress:   . Feeling of Stress :   Social Connections:   . Frequency of Communication with Friends and Family:   . Frequency of Social Gatherings with Friends and Family:   . Attends Religious Services:   . Active Member of Clubs or Organizations:   . Attends Archivist Meetings:   [name] Marital Status:     Family History  Problem Relation Age of Onset  . Leukemia  Father   . Heart attack Mother 57  . Diabetes Neg Hx   . Colon cancer Neg Hx   . Prostate cancer Neg Hx     Health Maintenance  Topic Date Due  . Hepatitis C Screening  Never done  . HIV Screening  Never done  . COVID-19 Vaccine (1) Never done  . INFLUENZA VACCINE  05/25/2020  . COLONOSCOPY  11/22/2024  . TETANUS/TDAP  06/15/2026     ----------------------------------------------------------------------------------------------------------------------------------------------------------------------------------------------------------------- Physical Exam BP (!) 169/110 (BP Location: Left Arm, Patient Position: Sitting, Cuff Size: Large)   Pulse 73   Temp 97.6 F (36.4 C) (Oral)   Ht 6' 0.05" (1.83 m)   Wt 298 lb 9.6 oz (135.4 kg)   SpO2 96%   BMI 40.44 kg/m   Physical Exam Constitutional:       Appearance: Normal appearance.  HENT:     Head: Normocephalic and atraumatic.  Eyes:     General: No scleral icterus. Cardiovascular:     Rate and Rhythm: Normal rate and regular rhythm.  Pulmonary:     Effort: Pulmonary effort is normal.     Breath sounds: Normal breath sounds.  Musculoskeletal:     Cervical back: Neck supple.  Neurological:     Mental Status: He is alert.     Comments: Normal strength in LLE  Psychiatric:        Mood and Affect: Mood normal.        Behavior: Behavior normal.     ------------------------------------------------------------------------------------------------------------------------------------------------------------------------------------------------------------------- Assessment and Plan  HTN (hypertension) BP elevated today. Better controlled at home. Will continue current medications.  Continue to monitor at home.  DASH diet reviewed.   Neurogenic claudication History of L lumbar radiculopathy now with increasing claudication symptoms.  This is significantly affecting ability to walk/exercise.  Referral to neurosurgery to discuss options for management of this.    Hyperlipidemia Tolerating atorvastatin well, continue current medication.     Meds ordered this encounter  Medications  . carvedilol (COREG) 12.5 MG tablet    Sig: Take 1 tablet (12.5 mg total) by mouth 2 (two) times daily with a meal.    Dispense:  180 tablet    Refill:  2  . amLODipine (NORVASC) 10 MG tablet    Sig: Take 1 tablet (10 mg total) by mouth daily.    Dispense:  90 tablet    Refill:  2  . atorvastatin (LIPITOR) 20 MG tablet    Sig: Take 1 tablet (20 mg total) by mouth daily.    Dispense:  90 tablet    Refill:  2  . lisinopril-hydrochlorothiazide (ZESTORETIC) 20-25 MG tablet    Sig: Take 1 tablet by mouth daily.    Dispense:  90 tablet    Refill:  2  . spironolactone (ALDACTONE) 25 MG tablet    Sig: Take 1 tablet (25 mg total) by mouth daily.     Dispense:  90 tablet    Refill:  2    Return in about 6 months (around 09/10/2020) for HTN.    This visit occurred during the SARS-CoV-2 public health emergency.  Safety protocols were in place, including screening questions prior to the visit, additional usage of staff PPE, and extensive cleaning of exam room while observing appropriate contact time as indicated for disinfecting solutions.

## 2020-03-11 DIAGNOSIS — G9519 Other vascular myelopathies: Secondary | ICD-10-CM | POA: Insufficient documentation

## 2020-03-11 NOTE — Assessment & Plan Note (Signed)
History of L lumbar radiculopathy now with increasing claudication symptoms.  This is significantly affecting ability to walk/exercise.  Referral to neurosurgery to discuss options for management of this.

## 2020-03-11 NOTE — Assessment & Plan Note (Signed)
BP elevated today. Better controlled at home. Will continue current medications.  Continue to monitor at home.  DASH diet reviewed.

## 2020-03-11 NOTE — Assessment & Plan Note (Signed)
Tolerating atorvastatin well, continue current medication.

## 2020-04-02 ENCOUNTER — Ambulatory Visit: Payer: Medicare HMO | Admitting: Orthopaedic Surgery

## 2020-04-03 DIAGNOSIS — M5416 Radiculopathy, lumbar region: Secondary | ICD-10-CM | POA: Diagnosis not present

## 2020-08-14 ENCOUNTER — Ambulatory Visit (INDEPENDENT_AMBULATORY_CARE_PROVIDER_SITE_OTHER): Payer: Medicare HMO | Admitting: Family Medicine

## 2020-08-14 ENCOUNTER — Encounter: Payer: Self-pay | Admitting: Family Medicine

## 2020-08-14 VITALS — BP 145/102 | HR 67 | Temp 97.8°F | Wt 296.0 lb

## 2020-08-14 DIAGNOSIS — E785 Hyperlipidemia, unspecified: Secondary | ICD-10-CM | POA: Diagnosis not present

## 2020-08-14 DIAGNOSIS — I10 Essential (primary) hypertension: Secondary | ICD-10-CM | POA: Diagnosis not present

## 2020-08-14 DIAGNOSIS — R339 Retention of urine, unspecified: Secondary | ICD-10-CM | POA: Insufficient documentation

## 2020-08-14 MED ORDER — ATORVASTATIN CALCIUM 20 MG PO TABS
20.0000 mg | ORAL_TABLET | Freq: Every day | ORAL | 2 refills | Status: AC
Start: 2020-08-14 — End: ?

## 2020-08-14 NOTE — Assessment & Plan Note (Signed)
Not currently taking atorvastatin, refilled today.

## 2020-08-14 NOTE — Assessment & Plan Note (Signed)
Check PSA and UA today.

## 2020-08-14 NOTE — Assessment & Plan Note (Signed)
Blood pressure is not at goal at for age and co-morbidities.  I discussed with him which medications he should be taken and he was provided with updated list..  In addition they were instructed to follow a low sodium diet with regular exercise to help to maintain adequate control of blood pressure.

## 2020-08-14 NOTE — Progress Notes (Signed)
Russell Garcia - 64 y.o. male MRN [id number]  Date of birth: 06/11/1956  Subjective Chief Complaint  Patient presents with  . Hypertension    HPI Russell Garcia is a 64 y.o. male here today for follow up of HTN.  Current prescribed medications for management of HTN include coreg CR 40mg  daily, amlodipine 10mg  daily, lisinopril/hctz 10/25mg  and aldactone 25mg  daily.  He reports that he is only taking coreg and amlodipine because this is all they gave him at the pharmacy.  BP remains elevated here today.  He denies symptoms including chest pain, shortness of breath, palpitations, headache or vision changes.    He also had an episode recently where he had difficulty and had to really strain to urinate.  Improved at this point but wanted to have things checked out.  He denies dysuria, penile discharge, fever, chills.   ROS:  A comprehensive ROS was completed and negative except as noted per HPI  No Known Allergies  Past Medical History:  Diagnosis Date  . Anxiety   . Arthritis   . Depression   . 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 Hypertension 2009    Past Surgical History:  Procedure Laterality Date  . INGUINAL HERNIA REPAIR    . R BKA    . TOTAL HIP ARTHROPLASTY Right 06/07/2019  . TOTAL HIP ARTHROPLASTY Right 06/07/2019   Procedure: RIGHT TOTAL HIP ARTHROPLASTY ANTERIOR APPROACH;  Surgeon: Mcarthur Rossetti, MD;  Location: [facility];  Service: Orthopedics;  Laterality: Right;    Social History   Socioeconomic History  . Marital status: Divorced    Spouse name: Not on file  . Number of children: 4  . Years of education: Not on file  . Highest education level: Not on file  Occupational History  . Occupation: Curator  Tobacco Use  . Smoking status: Never Smoker  . Smokeless tobacco: Never Used  Vaping Use  . Vaping Use: Never used  Substance and Sexual Activity  . Alcohol use: No    Alcohol/week: 0.0 standard drinks  . Drug use: No  .  Sexual activity: Yes    Partners: Female  Other Topics Concern  . Not on file  Social History Narrative            Social Determinants of Health   Financial Resource Strain:   . Difficulty of Paying Living Expenses: Not on file  Food Insecurity:   . Worried About Charity fundraiser in the Last Year: Not on file  . Ran Out of Food in the Last Year: Not on file  Transportation Needs:   . Lack of Transportation (Medical): Not on file  . Lack of Transportation (Non-Medical): Not on file  Physical Activity:   . Days of Exercise per Week: Not on file  . Minutes of Exercise per Session: Not on file  Stress:   . Feeling of Stress : Not on file  Social Connections:   . Frequency of Communication with Friends and Family: Not on file  . Frequency of Social Gatherings with Friends and Family: Not on file  . Attends Religious Services: Not on file  . Active Member of Clubs or Organizations: Not on file  . Attends Archivist Meetings: Not on file  . Marital Status: Not on file    Family History  Problem Relation Age of Onset  . Leukemia Father   . Heart attack Mother 63  .  Diabetes Neg Hx   . Colon cancer Neg Hx   . Prostate cancer Neg Hx     Health Maintenance  Topic Date Due  . Hepatitis C Screening  Never done  . COVID-19 Vaccine (1) Never done  . HIV Screening  Never done  . INFLUENZA VACCINE  05/25/2020  . COLONOSCOPY  11/22/2024  . TETANUS/TDAP  06/15/2026     ----------------------------------------------------------------------------------------------------------------------------------------------------------------------------------------------------------------- Physical Exam BP (!) 145/102   Pulse 67   Temp 97.8 F (36.6 C) (Oral)   Wt 296 lb (134.3 kg)   SpO2 95%   BMI 40.09 kg/m   Physical Exam Constitutional:      Appearance: Normal appearance.  HENT:     Head: Normocephalic and atraumatic.  Eyes:     General: No scleral  icterus. Cardiovascular:     Rate and Rhythm: Normal rate and regular rhythm.  Pulmonary:     Effort: Pulmonary effort is normal.     Breath sounds: Normal breath sounds.  Musculoskeletal:     Cervical back: Neck supple.  Skin:    General: Skin is warm and dry.  Neurological:     General: No focal deficit present.     Mental Status: He is alert.     ------------------------------------------------------------------------------------------------------------------------------------------------------------------------------------------------------------------- Assessment and Plan  HTN (hypertension) Blood pressure is not at goal at for age and co-morbidities.  I discussed with him which medications he should be taken and he was provided with updated list..  In addition they were instructed to follow a low sodium diet with regular exercise to help to maintain adequate control of blood pressure.    Urinary retention Check PSA and UA today.   Hyperlipidemia Not currently taking atorvastatin, refilled today.    Meds ordered this encounter  Medications  . atorvastatin (LIPITOR) 20 MG tablet    Sig: Take 1 tablet (20 mg total) by mouth daily.    Dispense:  90 tablet    Refill:  2    Return in about 3 months (around 11/14/2020) for HTN.    This visit occurred during the SARS-CoV-2 public health emergency.  Safety protocols were in place, including screening questions prior to the visit, additional usage of staff PPE, and extensive cleaning of exam room while observing appropriate contact time as indicated for disinfecting solutions.

## 2020-08-15 LAB — URINALYSIS, ROUTINE W REFLEX MICROSCOPIC
Bilirubin Urine: NEGATIVE
Glucose, UA: NEGATIVE
Hgb urine dipstick: NEGATIVE
Ketones, ur: NEGATIVE
Leukocytes,Ua: NEGATIVE
Nitrite: NEGATIVE
Protein, ur: NEGATIVE
Specific Gravity, Urine: 1.019 (ref 1.001–1.03)
pH: <=5 (ref 5.0–8.0)

## 2020-08-15 LAB — PSA: PSA: 0.94 ng/mL (ref <=4.0)

## 2020-09-10 ENCOUNTER — Ambulatory Visit: Payer: Medicare HMO | Admitting: Family Medicine

## 2020-11-17 ENCOUNTER — Ambulatory Visit (INDEPENDENT_AMBULATORY_CARE_PROVIDER_SITE_OTHER): Payer: Medicare HMO | Admitting: Family Medicine

## 2020-11-17 ENCOUNTER — Encounter: Payer: Self-pay | Admitting: Family Medicine

## 2020-11-17 ENCOUNTER — Other Ambulatory Visit: Payer: Self-pay

## 2020-11-17 VITALS — BP 150/100 | HR 83 | Wt 300.0 lb

## 2020-11-17 DIAGNOSIS — M4807 Spinal stenosis, lumbosacral region: Secondary | ICD-10-CM | POA: Diagnosis not present

## 2020-11-17 DIAGNOSIS — I10 Essential (primary) hypertension: Secondary | ICD-10-CM

## 2020-11-17 DIAGNOSIS — Z89511 Acquired absence of right leg below knee: Secondary | ICD-10-CM | POA: Diagnosis not present

## 2020-11-17 DIAGNOSIS — M5416 Radiculopathy, lumbar region: Secondary | ICD-10-CM

## 2020-11-17 DIAGNOSIS — G9519 Other vascular myelopathies: Secondary | ICD-10-CM | POA: Diagnosis not present

## 2020-11-17 DIAGNOSIS — R29818 Other symptoms and signs involving the nervous system: Secondary | ICD-10-CM

## 2020-11-17 DIAGNOSIS — E785 Hyperlipidemia, unspecified: Secondary | ICD-10-CM | POA: Diagnosis not present

## 2020-11-17 MED ORDER — SPIRONOLACTONE 25 MG PO TABS
25.0000 mg | ORAL_TABLET | Freq: Every day | ORAL | 2 refills | Status: DC
Start: 2020-11-17 — End: 2021-12-04

## 2020-11-17 MED ORDER — AMLODIPINE BESYLATE 10 MG PO TABS
10.0000 mg | ORAL_TABLET | Freq: Every day | ORAL | 2 refills | Status: DC
Start: 2020-11-17 — End: 2021-12-04

## 2020-11-17 MED ORDER — LISINOPRIL-HYDROCHLOROTHIAZIDE 20-25 MG PO TABS
1.0000 | ORAL_TABLET | Freq: Every day | ORAL | 2 refills | Status: DC
Start: 2020-11-17 — End: 2021-12-04

## 2020-11-17 MED ORDER — CARVEDILOL 12.5 MG PO TABS
12.5000 mg | ORAL_TABLET | Freq: Two times a day (BID) | ORAL | 2 refills | Status: DC
Start: 2020-11-17 — End: 2021-12-04

## 2020-11-17 NOTE — Progress Notes (Signed)
Russell Garcia - 65 y.o. male MRN [id number]  Date of birth: [birth year]  Subjective Chief Complaint  Patient presents with  . Hypertension    HPI Russell Garcia is a 65 y.o. 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  HTN is currently managed with several medications including amlodipine, carvedilol, lisinopril-hctz and aldactone.  He has been out of amlodipine for a couple of weeks.  He has not been checking BP at home.  He denies symptoms related to HTN including chest pain, shortness of breath, palpitations, headache or vision changes.    Continues to tolerate atorvastatin well for management of HLD.  He also continues to have low back pain with radiation into legs. This has worsened over the last several months.   He has seen neurosurgery but needed updated imaging which was not approved by insurance. Last MRI in 2017 showing DDD with impingment at L3-L4, L4-L5.  He has changed insurance.  providers.  ROS:  A comprehensive ROS was completed and negative except as noted per HPI  No Known Allergies  Past Medical History:  Diagnosis Date  . Anxiety   . Arthritis   . Depression   . 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 Hypertension 2009    Past Surgical History:  Procedure Laterality Date  . INGUINAL HERNIA REPAIR    . R BKA    . TOTAL HIP ARTHROPLASTY Right 06/07/2019  . TOTAL HIP ARTHROPLASTY Right 06/07/2019   Procedure: RIGHT TOTAL HIP ARTHROPLASTY ANTERIOR APPROACH;  Surgeon: Mcarthur Rossetti, MD;  Location: [facility];  Service: Orthopedics;  Laterality: Right;    Social History   Socioeconomic History  . Marital status: Divorced    Spouse name: Not on file  . Number of children: 4  . Years of education: Not on file  . Highest education level: Not on file  Occupational History  . Occupation: Curator  Tobacco Use  . Smoking status: Never Smoker  . Smokeless tobacco: Never Used   Vaping Use  . Vaping Use: Never used  Substance and Sexual Activity  . Alcohol use: No    Alcohol/week: 0.0 standard drinks  . Drug use: No  . Sexual activity: Yes    Partners: Female  Other Topics Concern  . Not on file  Social History Narrative            Social Determinants of Health   Financial Resource Strain: Not on file  Food Insecurity: Not on file  Transportation Needs: Not on file  Physical Activity: Not on file  Stress: Not on file  Social Connections: Not on file    Family History  Problem Relation Age of Onset  . Leukemia Father   . Heart attack Mother 51  . Diabetes Neg Hx   . Colon cancer Neg Hx   . Prostate cancer Neg Hx     Health Maintenance  Topic Date Due  . Hepatitis C Screening  Never done  . HIV Screening  Never done  . INFLUENZA VACCINE  05/25/2020  . COVID-19 Vaccine (3 - Booster for Pfizer series) 03/15/2021  . COLONOSCOPY (Pts 45-92yrs Insurance coverage will need to be confirmed)  11/22/2024  . TETANUS/TDAP  06/15/2026     ----------------------------------------------------------------------------------------------------------------------------------------------------------------------------------------------------------------- Physical Exam BP (!) 150/100 (BP Location: Right Arm, Patient Position: Sitting, Cuff Size: Large)   Pulse 83   Wt 300 lb (136.1 kg)  SpO2 97%   BMI 40.63 kg/m   Physical Exam Constitutional:      Appearance: Normal appearance.  HENT:     Head: Normocephalic and atraumatic.  Eyes:     General: No scleral icterus. Cardiovascular:     Rate and Rhythm: Normal rate and regular rhythm.  Pulmonary:     Effort: Pulmonary effort is normal.     Breath sounds: Normal breath sounds.  Musculoskeletal:     Cervical back: Neck supple.  Skin:    General: Skin is warm and dry.  Neurological:     General: No focal deficit present.     Mental Status: He is alert.  Psychiatric:        Mood and Affect: Mood  normal.        Behavior: Behavior normal.     ------------------------------------------------------------------------------------------------------------------------------------------------------------------------------------------------------------------- Assessment and Plan  HTN (hypertension) BP elevated today but has been out of amlodipine for a couple of weeks.  Rx updated and he will return in about 4 weeks for nurse visit for BP recheck.  Updated labs ordered.    Hyperlipidemia Tolerating atorvastatin, update lipid panel.   Neurogenic claudication Continues to have increased radicular and claudication symptoms.  He needs updated imaging of the lumbar spine.  Updated MRI spine ordered today.    Meds ordered this encounter  Medications  . carvedilol (COREG) 12.5 MG tablet    Sig: Take 1 tablet (12.5 mg total) by mouth 2 (two) times daily with a meal.    Dispense:  180 tablet    Refill:  2  . lisinopril-hydrochlorothiazide (ZESTORETIC) 20-25 MG tablet    Sig: Take 1 tablet by mouth daily.    Dispense:  90 tablet    Refill:  2  . spironolactone (ALDACTONE) 25 MG tablet    Sig: Take 1 tablet (25 mg total) by mouth daily.    Dispense:  90 tablet    Refill:  2  . amLODipine (NORVASC) 10 MG tablet    Sig: Take 1 tablet (10 mg total) by mouth daily.    Dispense:  90 tablet    Refill:  2    Return in about 6 months (around 05/17/2021) for HTN/HLD.    This visit occurred during the SARS-CoV-2 public health emergency.  Safety protocols were in place, including screening questions prior to the visit, additional usage of staff PPE, and extensive cleaning of exam room while observing appropriate contact time as indicated for disinfecting solutions.

## 2020-11-17 NOTE — Assessment & Plan Note (Signed)
Continues to have increased radicular and claudication symptoms.  He needs updated imaging of the lumbar spine.  Updated MRI spine ordered today.

## 2020-11-17 NOTE — Patient Instructions (Signed)
Great to see you today! Have labs completed Follow up in about 4 weeks for nurse visit to check BP See me again in 6 months.

## 2020-11-17 NOTE — Assessment & Plan Note (Signed)
Tolerating atorvastatin, update lipid panel.

## 2020-11-17 NOTE — Assessment & Plan Note (Signed)
BP elevated today but has been out of amlodipine for a couple of weeks.  Rx updated and he will return in about 4 weeks for nurse visit for BP recheck.  Updated labs ordered.

## 2020-11-22 ENCOUNTER — Other Ambulatory Visit: Payer: Self-pay

## 2020-11-22 ENCOUNTER — Ambulatory Visit (INDEPENDENT_AMBULATORY_CARE_PROVIDER_SITE_OTHER): Payer: Medicare HMO

## 2020-11-22 DIAGNOSIS — M5126 Other intervertebral disc displacement, lumbar region: Secondary | ICD-10-CM | POA: Diagnosis not present

## 2020-11-22 DIAGNOSIS — G9519 Other vascular myelopathies: Secondary | ICD-10-CM

## 2020-11-22 DIAGNOSIS — M5416 Radiculopathy, lumbar region: Secondary | ICD-10-CM

## 2020-11-22 DIAGNOSIS — M5136 Other intervertebral disc degeneration, lumbar region: Secondary | ICD-10-CM | POA: Diagnosis not present

## 2020-11-22 DIAGNOSIS — M4807 Spinal stenosis, lumbosacral region: Secondary | ICD-10-CM

## 2020-11-22 DIAGNOSIS — M545 Low back pain, unspecified: Secondary | ICD-10-CM | POA: Diagnosis not present

## 2020-12-09 DIAGNOSIS — M48062 Spinal stenosis, lumbar region with neurogenic claudication: Secondary | ICD-10-CM | POA: Diagnosis not present

## 2020-12-09 DIAGNOSIS — M5416 Radiculopathy, lumbar region: Secondary | ICD-10-CM | POA: Diagnosis not present

## 2020-12-09 DIAGNOSIS — Z6841 Body Mass Index (BMI) 40.0 and over, adult: Secondary | ICD-10-CM | POA: Diagnosis not present

## 2020-12-09 DIAGNOSIS — I10 Essential (primary) hypertension: Secondary | ICD-10-CM | POA: Diagnosis not present

## 2020-12-15 ENCOUNTER — Ambulatory Visit: Payer: Medicare HMO

## 2020-12-17 ENCOUNTER — Telehealth: Payer: Self-pay

## 2020-12-17 NOTE — Telephone Encounter (Signed)
Pt lvm stating he was returning a call from this office.   No calls are noted in his chart. His last appt was with a different provider for Radiculopathy.   Attempted to contact the patient several times. No answer. VM is always full.   Patient should contact radiculopathy provider for more information.

## 2020-12-18 ENCOUNTER — Telehealth: Payer: Self-pay

## 2020-12-18 ENCOUNTER — Other Ambulatory Visit: Payer: Self-pay | Admitting: Family Medicine

## 2020-12-18 DIAGNOSIS — G9519 Other vascular myelopathies: Secondary | ICD-10-CM

## 2020-12-18 DIAGNOSIS — M5416 Radiculopathy, lumbar region: Secondary | ICD-10-CM

## 2020-12-18 NOTE — Telephone Encounter (Signed)
New referral placed.

## 2020-12-18 NOTE — Telephone Encounter (Signed)
Patient contact was finally made with patient concerning imaging results and recommendations.   Please place neurosurgery referral.   I've requested that Mr. Diffee clear his voicemail. Advising that the Neurosurgeon's office will be attempting contact.   Mr. Mooneyhan has agreed to clear his voicemail so that he can receive messages concerning his referral.

## 2020-12-30 ENCOUNTER — Ambulatory Visit: Payer: Medicare HMO | Admitting: Physician Assistant

## 2020-12-30 ENCOUNTER — Encounter: Payer: Self-pay | Admitting: Orthopedic Surgery

## 2020-12-30 DIAGNOSIS — Z89511 Acquired absence of right leg below knee: Secondary | ICD-10-CM

## 2020-12-30 NOTE — Progress Notes (Signed)
Office Visit Note   Patient: Russell Garcia           Date of Birth: [birth year]           MRN: [id number] Visit Date: 12/30/2020              Requested by: Luetta Nutting, [address] [place],  [place] [address] PCP: Luetta Nutting, DO  Chief Complaint  Patient presents with  . Right Leg - Pain      HPI: Patient is status post right below-knee amputation.  His socket has broken down and he is keeping it together with tape.  Also his liner is ripped.  He is requesting prescription for new prosthetic and liner Assessment & Plan: Visit Diagnoses: No diagnosis found.  Plan: Patient was provided a prescription for a new socket liner may follow-up  Follow-Up Instructions: No follow-ups on file.   Ortho Exam  Patient is alert, oriented, no adenopathy, well-dressed, normal affect, normal respiratory effort. Examination demonstrates no skin breakdown over the amputation stump no swelling no cellulitis.  The socket has been taped down in the ankle to keep its integrity.  Also liner has tears and is no longer functional  Patient is an existing right transtibial  amputee.  Patient's current comorbidities are not expected to impact the ability to function with the prescribed prosthesis. Patient verbally communicates a strong desire to use a prosthesis. Patient currently requires mobility aids to ambulate without a prosthesis.  Expects not to use mobility aids with a new prosthesis.  Patient is a K3 level ambulator that spends a lot of time walking around on uneven terrain over obstacles, up and down stairs, and ambulates with a variable cadence.    Imaging: No results found. No images are attached to the encounter.  Labs: Lab Results  Component Value Date   LABORGA NO GROWTH 09/22/2012     No results found for: ALBUMIN, PREALBUMIN, LABURIC  No results found for: MG No results found for: VD25OH  No results found for: PREALBUMIN CBC EXTENDED Latest  Ref Rng & Units 03/10/2020 06/08/2019 06/07/2019  WBC 3.8 - 10.8 Thousand/uL 4.9 10.6(H) 5.6  RBC 4.20 - 5.80 Million/uL 5.02 4.32 5.00  HGB 13.2 - 17.1 g/dL 13.3 11.5(L) 13.4  HCT 38.5 - 50.0 % 41.7 36.5(L) 41.9  PLT 140 - 400 Thousand/uL 182 153 180  NEUTROABS 1.4 - 7.7 K/uL - - -  LYMPHSABS 0.7 - 4.0 K/uL - - -     There is no height or weight on file to calculate BMI.  Orders:  No orders of the defined types were placed in this encounter.  No orders of the defined types were placed in this encounter.    Procedures: No procedures performed  Clinical Data: No additional findings.  ROS:  All other systems negative, except as noted in the HPI. Review of Systems  Objective: Vital Signs: There were no vitals taken for this visit.  Specialty Comments:  No specialty comments available.  PMFS History: Patient Active Problem List   Diagnosis Date Noted  . Urinary retention 08/14/2020  . Neurogenic claudication ([place]) 03/11/2020  . Carpal tunnel syndrome of left wrist 08/06/2019  . Status post total replacement of right hip 06/07/2019  . Right hip pain 05/14/2016  . Left lumbar radiculopathy 05/14/2016  . Dermatitis 03/09/2011  . Hyperlipidemia 03/09/2011  . NUMBNESS 05/16/2010  . 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 HTN (hypertension) 07/31/2008  . Morbid obesity (  [place]) 08/22/2007  . HERNIA, HX OF 08/16/2007  . Status post below-knee amputation ([facility]) 08/16/2007   Past Medical History:  Diagnosis Date  . Anxiety   . Arthritis   . Depression   . 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 Hypertension 2009    Family History  Problem Relation Age of Onset  . Leukemia Father   . Heart attack Mother 74  . Diabetes Neg Hx   . Colon cancer Neg Hx   . Prostate cancer Neg Hx     Past Surgical History:  Procedure Laterality Date  . INGUINAL HERNIA REPAIR    . R BKA    . TOTAL HIP ARTHROPLASTY Right 06/07/2019  . TOTAL HIP  ARTHROPLASTY Right 06/07/2019   Procedure: RIGHT TOTAL HIP ARTHROPLASTY ANTERIOR APPROACH;  Surgeon: Mcarthur Rossetti, MD;  [facility];  Service: Orthopedics;  Laterality: Right;   Social History   Occupational History  . Occupation: Curator  Tobacco Use  . Smoking status: Never Smoker  . Smokeless tobacco: Never Used  Vaping Use  . Vaping Use: Never used  Substance and Sexual Activity  . Alcohol use: No    Alcohol/week: 0.0 standard drinks  . Drug use: No  . Sexual activity: Yes    Partners: Female

## 2021-01-02 ENCOUNTER — Ambulatory Visit (INDEPENDENT_AMBULATORY_CARE_PROVIDER_SITE_OTHER): Payer: Self-pay | Admitting: Family Medicine

## 2021-01-02 DIAGNOSIS — Z5329 Procedure and treatment not carried out because of patient's decision for other reasons: Secondary | ICD-10-CM

## 2021-01-05 ENCOUNTER — Telehealth: Payer: Self-pay | Admitting: General Practice

## 2021-01-05 NOTE — Telephone Encounter (Signed)
Documentation only.

## 2021-04-23 DIAGNOSIS — Z89511 Acquired absence of right leg below knee: Secondary | ICD-10-CM | POA: Diagnosis not present

## 2021-05-18 ENCOUNTER — Ambulatory Visit: Payer: Medicare HMO | Admitting: Family Medicine

## 2021-06-17 ENCOUNTER — Telehealth: Payer: Self-pay | Admitting: Lab

## 2021-06-17 NOTE — Chronic Care Management (AMB) (Signed)
  Chronic Care Management   Note  06/17/2021 Name: Russell Garcia MRN: [id number] DOB: October 21, 1956  Russell Garcia is a 65 y.o. year old male who is a primary care patient of Luetta Nutting, DO. I reached out to [employer] by phone today in response to a referral sent by [name]'s PCP, Luetta Nutting, DO.   [name] was given information about Chronic Care Management services today including:  CCM service includes personalized support from designated clinical staff supervised by his physician, including individualized plan of care and coordination with other care providers 24/7 contact phone numbers for assistance for urgent and routine care needs. Service will only be billed when office clinical staff spend 20 minutes or more in a month to coordinate care. Only one practitioner may furnish and bill the service in a calendar month. The patient may stop CCM services at any time (effective at the end of the month) by phone call to the office staff.   Patient agreed to services and verbal consent obtained.   Follow up plan:   [name]

## 2021-07-27 ENCOUNTER — Telehealth: Payer: Self-pay | Admitting: Pharmacist

## 2021-07-27 NOTE — Chronic Care Management (AMB) (Signed)
    Chronic Care Management Pharmacy Assistant   Name: Russell Garcia  MRN: [id number] DOB: May 17, 1956  Russell Garcia is an 65 y.o. year old male who presents for his initial CCM visit with the clinical pharmacist.  Recent office visits:  None Noted  Recent consult visits:  None Noted  Hospital visits:  None in previous 6 months  Medications: Outpatient Encounter Medications as of 07/27/2021  Medication Sig   amLODipine (NORVASC) 10 MG tablet Take 1 tablet (10 mg total) by mouth daily.   aspirin EC 325 MG tablet Take 325 mg by mouth 2 (two) times daily.    atorvastatin (LIPITOR) 20 MG tablet Take 1 tablet (20 mg total) by mouth daily.   carvedilol (COREG) 12.5 MG tablet Take 1 tablet (12.5 mg total) by mouth 2 (two) times daily with a meal.   diclofenac sodium (VOLTAREN) 1 % GEL Apply 2 g topically 4 (four) times daily as needed (pain).   lisinopril-hydrochlorothiazide (ZESTORETIC) 20-25 MG tablet Take 1 tablet by mouth daily.   magnesium gluconate (MAGONATE) 500 MG tablet Take 500 mg by mouth 3 (three) times a week.   spironolactone (ALDACTONE) 25 MG tablet Take 1 tablet (25 mg total) by mouth daily.   No facility-administered encounter medications on file as of 07/27/2021.    Current Medication List  amLODipine (NORVASC) 10 MG tablet last filled 11/17/20 90 DS aspirin EC 325 MG tablet atorvastatin (LIPITOR) 20 MG tablet last filled 08/14/20 90 DS carvedilol (COREG) 12.5 MG tablet last filled 11/17/20 90 DS diclofenac sodium (VOLTAREN) 1 % GEL last filled 08/06/19  lisinopril-hydrochlorothiazide 20-25 MG tablet last filled 11/17/20 90 DS magnesium gluconate (MAGONATE) 500 MG tablet  spironolactone (ALDACTONE) 25 MG tablet last filled 11/17/20 90 DS  [place] Pharmacist Assistant [phone]

## 2021-07-29 ENCOUNTER — Other Ambulatory Visit: Payer: Self-pay

## 2021-07-29 ENCOUNTER — Ambulatory Visit (INDEPENDENT_AMBULATORY_CARE_PROVIDER_SITE_OTHER): Payer: Medicare HMO | Admitting: Pharmacist

## 2021-07-29 ENCOUNTER — Encounter (INDEPENDENT_AMBULATORY_CARE_PROVIDER_SITE_OTHER): Payer: Medicare HMO | Admitting: Ophthalmology

## 2021-07-29 ENCOUNTER — Telehealth: Payer: Self-pay

## 2021-07-29 ENCOUNTER — Other Ambulatory Visit: Payer: Self-pay | Admitting: Family Medicine

## 2021-07-29 DIAGNOSIS — I10 Essential (primary) hypertension: Secondary | ICD-10-CM

## 2021-07-29 DIAGNOSIS — H35033 Hypertensive retinopathy, bilateral: Secondary | ICD-10-CM

## 2021-07-29 DIAGNOSIS — H43393 Other vitreous opacities, bilateral: Secondary | ICD-10-CM

## 2021-07-29 DIAGNOSIS — E785 Hyperlipidemia, unspecified: Secondary | ICD-10-CM

## 2021-07-29 DIAGNOSIS — H43813 Vitreous degeneration, bilateral: Secondary | ICD-10-CM | POA: Diagnosis not present

## 2021-07-29 DIAGNOSIS — H2513 Age-related nuclear cataract, bilateral: Secondary | ICD-10-CM

## 2021-07-29 DIAGNOSIS — H538 Other visual disturbances: Secondary | ICD-10-CM

## 2021-07-29 NOTE — Telephone Encounter (Signed)
Pt states seeing floaters with lightening in his eyes. States he has been seeing floaters for a while but last night there was lightening and it scared him.   Also, requesting referral to [facility] for Colonoscopy.   Advised pt that Dr. Zigmund Daniel would be notified and expect a callback after referrals were placed.

## 2021-07-29 NOTE — Patient Instructions (Signed)
Visit Information   PATIENT GOALS:   Goals Addressed             This Visit's Progress    Medication Management       Patient Goals/Self-Care Activities Over the next 30 days, patient will:  take medications as prescribed and check blood pressure 2x per week, document, and provide at future appointments  Follow Up Plan: Telephone follow up appointment with care management team member scheduled for:  2 weeks to reconnect with patient who ended call early due to conflict in schedule         Consent to CCM Services: Mr. Schauer was given information about Chronic Care Management services including:  CCM service includes personalized support from designated clinical staff supervised by his physician, including individualized plan of care and coordination with other care providers 24/7 contact phone numbers for assistance for urgent and routine care needs. Service will only be billed when office clinical staff spend 20 minutes or more in a month to coordinate care. Only one practitioner may furnish and bill the service in a calendar month. The patient may stop CCM services at any time (effective at the end of the month) by phone call to the office staff. The patient will be responsible for cost sharing (co-pay) of up to 20% of the service fee (after annual deductible is met).  Patient agreed to services and verbal consent obtained.   The patient verbalized understanding of instructions, educational materials, and care plan provided today and agreed to receive a mailed copy of patient instructions, educational materials, and care plan.   Telephone follow up appointment with care management team member scheduled for: 2 weeks  Darius Bump   CLINICAL CARE PLAN: Patient Care Plan: Medication Management     Problem Identified: HTN, HLD      Long-Range Goal: Disease Progression Prevention   Start Date: 07/29/2021  This Visit's Progress: On track  Priority: High  Note:   Current  Barriers:  Suboptimal therapeutic regimen for hypertension Review of medication adherence: Coreg was taking daily instead of BID Side effects per patient: Blurry vision w/ lisin-hctz ED- possible offending agents include hctz, spironolactone Light-headed - may be hydration or pressure-related  Pharmacist Clinical Goal(s):  Over the next 30 days, patient will adhere to plan to optimize therapeutic regimen for hypertension as evidenced by report of adherence to recommended medication management changes through collaboration with PharmD and provider.   Interventions: 1:1 collaboration with Luetta Nutting, DO regarding development and update of comprehensive plan of care as evidenced by provider attestation and co-signature Inter-disciplinary care team collaboration (see longitudinal plan of care) Comprehensive medication review performed; medication list updated in electronic medical record  Hypertension:   Uncontrolled; current treatment:lisinopril-hctz 20-46m daily, amlodipine 172mdaily, coreg 12.5m4mID, spironolactone 27m53mily;   Current home readings: to be discussed at future visits  Hypotensive/hypertensive symptoms to be discussed at future visits  Assessed medication regimen & side effects contributing to non-adherence: may consider optimizing lisinopril to 40mg72meliminate hctz. Recommend patient take coreg 12.5mg B48mas prescribed instead of daily.  Hyperlipidemia:  Uncontrolled; current treatment:atorvastatin 20mg d31m; LDL 144 (PCP attempted incrs to atorvastatin 40mg bu52m answer x5 & VM full)  Recommended increase to atorvastatin 40mg dai70m Patient Goals/Self-Care Activities Over the next 30 days, patient will:  take medications as prescribed and check blood pressure 2x per week, document, and provide at future appointments  Follow Up Plan: Telephone follow up appointment with care management  team member scheduled for:  2 weeks to reconnect with patient who ended  call early due to conflict in schedule

## 2021-07-29 NOTE — Progress Notes (Signed)
Chronic Care Management Pharmacy Note  07/29/2021 Name:  Russell Garcia MRN:  [id number] DOB:  08/24/1956  Summary: addressed HTN, HLD. Previous attempt  by PCP to increase atorvastatin to 40mg . Patient not adherent to lisin-hctz, and was only taking coreg once daily instead of BID as prescribed. Described side effects of erectile dysfunction and blurry vision. Patient abruptly ended our call early due to conflict (he was at an eye appt).   Recommendations/Changes made from today's visit: none, but in near future, consider increase atorvastatin dose. Also consider optimizing lisinopril to 40mg  to eliminate hctz.   Plan: f/u with pharmacist in 2 weeks  Subjective: Russell Garcia is an 65 y.o. year old male who is a primary patient of Luetta Nutting, DO.  The CCM team was consulted for assistance with disease management and care coordination needs.    Engaged with patient by telephone for initial visit in response to provider referral for pharmacy case management and/or care coordination services.   Consent to Services:  The patient was given information about Chronic Care Management services, agreed to services, and gave verbal consent prior to initiation of services.  Please see initial visit note for detailed documentation.   Patient Care Team: Luetta Nutting, DO as PCP - General (Family Medicine) Darius Bump, [facility] as Pharmacist (Pharmacist)  Recent office visits:  None Noted   Recent consult visits:  None Noted   Hospital visits:  None in previous 6 months  Objective:  Lab Results  Component Value Date   CREATININE 1.19 03/10/2020   CREATININE 1.70 (H) 06/08/2019   CREATININE 1.25 (H) 06/05/2019    No results found for: HGBA1C Last diabetic Eye exam: No results found for: HMDIABEYEEXA  Last diabetic Foot exam: No results found for: HMDIABFOOTEX      Component Value Date/Time   CHOL 217 (H) 03/10/2020 1125   TRIG 103 03/10/2020 1125   HDL 51 03/10/2020 1125   CHOLHDL  4.3 03/10/2020 1125   VLDL 18.6 03/09/2011 1121   LDLCALC 144 (H) 03/10/2020 1125   LDLDIRECT 171 (H) 05/11/2019 1201    Hepatic Function Latest Ref Rng & Units 03/10/2020 05/11/2019 05/06/2010  Total Protein 6.1 - 8.1 g/dL 7.4 7.4 -  AST 10 - 35 U/L 18 15 21   ALT 9 - 46 U/L 16 12 17   Total Bilirubin 0.2 - 1.2 mg/dL 0.5 0.6 -    Lab Results  Component Value Date/Time   TSH 1.08 05/06/2010 08:30 AM   TSH 0.96 08/22/2007 03:21 PM    CBC Latest Ref Rng & Units 03/10/2020 06/08/2019 06/07/2019  WBC 3.8 - 10.8 Thousand/uL 4.9 10.6(H) 5.6  Hemoglobin 13.2 - 17.1 g/dL 13.3 11.5(L) 13.4  Hematocrit 38.5 - 50.0 % 41.7 36.5(L) 41.9  Platelets 140 - 400 Thousand/uL 182 153 180    No results found for: VD25OH  Clinical ASCVD: No  The 10-year ASCVD risk score (Arnett DK, et al., 2019) is: 25.6%   Values used to calculate the score:     Age: 15 years     Sex: Male     Is Non-Hispanic African American: Yes     Diabetic: No     Tobacco smoker: No     Systolic Blood Pressure: 539 mmHg     Is BP treated: Yes     HDL Cholesterol: 51 mg/dL     Total Cholesterol: 217 mg/dL    Other: (CHADS2VASc if Afib, PHQ9 if depression, MMRC or CAT for COPD, ACT, DEXA)  Social History   Tobacco Use  Smoking Status Never  Smokeless Tobacco Never   BP Readings from Last 3 Encounters:  11/17/20 (!) 150/100  08/14/20 (!) 145/102  03/10/20 (!) 169/110   Pulse Readings from Last 3 Encounters:  11/17/20 83  08/14/20 67  03/10/20 73   Wt Readings from Last 3 Encounters:  11/17/20 300 lb (136.1 kg)  08/14/20 296 lb (134.3 kg)  03/10/20 298 lb 9.6 oz (135.4 kg)    Assessment: Review of patient past medical history, allergies, medications, health status, including review of consultants reports, laboratory and other test data, was performed as part of comprehensive evaluation and provision of chronic care management services.   SDOH:  (Social Determinants of Health) assessments and interventions  performed:    CCM Care Plan  No Known Allergies  Medications Reviewed Today     Reviewed by Persons, Bevely Palmer, [place] (Physician Assistant) on 12/30/20 at 1408  Med List Status: <None>   Medication Order Taking? Sig Documenting Provider Last Dose Status Informant  amLODipine (NORVASC) 10 MG tablet [id number]  Take 1 tablet (10 mg total) by mouth daily. Luetta Nutting, DO  Active   aspirin EC 325 MG tablet [id number] No Take 325 mg by mouth 2 (two) times daily.  [provider] Taking Active Self  atorvastatin (LIPITOR) 20 MG tablet [id number] No Take 1 tablet (20 mg total) by mouth daily. Luetta Nutting, DO Taking Active   carvedilol (COREG) 12.5 MG tablet [id number]  Take 1 tablet (12.5 mg total) by mouth 2 (two) times daily with a meal. Luetta Nutting, DO  Active   diclofenac sodium (VOLTAREN) 1 % GEL [id number] No Apply 2 g topically 4 (four) times daily as needed (pain). Gregor Hams, MD Taking Active   lisinopril-hydrochlorothiazide (ZESTORETIC) 20-25 MG tablet [id number]  Take 1 tablet by mouth daily. Luetta Nutting, DO  Active   magnesium gluconate (MAGONATE) 500 MG tablet [id number] No Take 500 mg by mouth 3 (three) times a week. [provider] Taking Active Self  spironolactone (ALDACTONE) 25 MG tablet [id number]  Take 1 tablet (25 mg total) by mouth daily. 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   Medication Assistance:  TBD  Patient's preferred pharmacy is:  [id number] - [place], [place] [address] [place] [address] Phone: [phone] Fax: [phone]  Uses pill box? No - original bottles & working well Pt endorses 100% compliance  Follow Up:  Patient agrees to Care  Plan and Follow-up.  Plan: Telephone follow up appointment with care management team member scheduled for:  2 weeks  Darius Bump

## 2021-08-11 ENCOUNTER — Telehealth: Payer: Medicare HMO

## 2021-08-11 ENCOUNTER — Telehealth: Payer: Self-pay | Admitting: Pharmacist

## 2021-08-11 NOTE — Progress Notes (Deleted)
Chronic Care Management Pharmacy Note  08/11/2021 Name:  Russell Garcia MRN:  [id number] DOB:  [birth year]  Summary: addressed HTN, HLD. Previous attempt  by PCP to increase atorvastatin to 40mg . Patient not adherent to lisin-hctz, and was only taking coreg once daily instead of BID as prescribed. Described side effects of erectile dysfunction and blurry vision. Patient abruptly ended our call early due to conflict (he was at an eye appt).   Recommendations/Changes made from today's visit: none, but in near future, consider increase atorvastatin dose. Also consider optimizing lisinopril to 40mg  to eliminate hctz.   Plan: f/u with pharmacist in 2 weeks  Subjective: Russell Garcia is an 65 y.o. year old male who is a primary patient of Luetta Nutting, DO.  The CCM team was consulted for assistance with disease management and care coordination needs.    Engaged with patient by telephone for initial visit in response to provider referral for pharmacy case management and/or care coordination services.   Consent to Services:  The patient was given information about Chronic Care Management services, agreed to services, and gave verbal consent prior to initiation of services.  Please see initial visit note for detailed documentation.   Patient Care Team: Luetta Nutting, DO as PCP - General (Family Medicine) Darius Bump, [facility] as Pharmacist (Pharmacist)  Recent office visits:  None Noted   Recent consult visits:  None Noted   Hospital visits:  None in previous 6 months  Objective:  Lab Results  Component Value Date   CREATININE 1.19 03/10/2020   CREATININE 1.70 (H) 06/08/2019   CREATININE 1.25 (H) 06/05/2019    No results found for: HGBA1C Last diabetic Eye exam: No results found for: HMDIABEYEEXA  Last diabetic Foot exam: No results found for: HMDIABFOOTEX      Component Value Date/Time   CHOL 217 (H) 03/10/2020 1125   TRIG 103 03/10/2020 1125   HDL 51 03/10/2020 1125    CHOLHDL 4.3 03/10/2020 1125   VLDL 18.6 03/09/2011 1121   LDLCALC 144 (H) 03/10/2020 1125   LDLDIRECT 171 (H) 05/11/2019 1201    Hepatic Function Latest Ref Rng & Units 03/10/2020 05/11/2019 05/06/2010  Total Protein 6.1 - 8.1 g/dL 7.4 7.4 -  AST 10 - 35 U/L 18 15 21   ALT 9 - 46 U/L 16 12 17   Total Bilirubin 0.2 - 1.2 mg/dL 0.5 0.6 -    Lab Results  Component Value Date/Time   TSH 1.08 05/06/2010 08:30 AM   TSH 0.96 08/22/2007 03:21 PM    CBC Latest Ref Rng & Units 03/10/2020 06/08/2019 06/07/2019  WBC 3.8 - 10.8 Thousand/uL 4.9 10.6(H) 5.6  Hemoglobin 13.2 - 17.1 g/dL 13.3 11.5(L) 13.4  Hematocrit 38.5 - 50.0 % 41.7 36.5(L) 41.9  Platelets 140 - 400 Thousand/uL 182 153 180    No results found for: VD25OH  Clinical ASCVD: No  The 10-year ASCVD risk score (Arnett DK, et al., 2019) is: 20.8%   Values used to calculate the score:     Age: 26 years     Sex: Male     Is Non-Hispanic African American: Yes     Diabetic: No     Tobacco smoker: No     Systolic Blood Pressure: 094 mmHg     Is BP treated: Yes     HDL Cholesterol: 51 mg/dL     Total Cholesterol: 217 mg/dL    Other: (CHADS2VASc if Afib, PHQ9 if depression, MMRC or CAT for COPD, ACT, DEXA)  Social History   Tobacco Use  Smoking Status Never  Smokeless Tobacco Never   BP Readings from Last 3 Encounters:  11/17/20 (!) 150/100  08/14/20 (!) 145/102  03/10/20 (!) 169/110   Pulse Readings from Last 3 Encounters:  11/17/20 83  08/14/20 67  03/10/20 73   Wt Readings from Last 3 Encounters:  11/17/20 300 lb (136.1 kg)  08/14/20 296 lb (134.3 kg)  03/10/20 298 lb 9.6 oz (135.4 kg)    Assessment: Review of patient past medical history, allergies, medications, health status, including review of consultants reports, laboratory and other test data, was performed as part of comprehensive evaluation and provision of chronic care management services.   SDOH:  (Social Determinants of Health) assessments and  interventions performed:    CCM Care Plan  No Known Allergies  Medications Reviewed Today     Reviewed by Darius Bump, [facility] (Pharmacist) on 07/29/21 at 1423  Med List Status: <None>   Medication Order Taking? Sig Documenting Provider Last Dose Status Informant  amLODipine (NORVASC) 10 MG tablet [id number] Yes Take 1 tablet (10 mg total) by mouth daily. Luetta Nutting, DO Taking Active   aspirin EC 325 MG tablet [id number] Yes Take 325 mg by mouth daily. [provider] Taking Active Self  atorvastatin (LIPITOR) 20 MG tablet [id number] Yes Take 1 tablet (20 mg total) by mouth daily. Luetta Nutting, DO Taking Active   carvedilol (COREG) 12.5 MG tablet [id number] Yes Take 1 tablet (12.5 mg total) by mouth 2 (two) times daily with a meal. Luetta Nutting, DO Taking Active   diclofenac sodium (VOLTAREN) 1 % GEL [id number] No Apply 2 g topically 4 (four) times daily as needed (pain).  Patient not taking: Reported on 07/29/2021   Gregor Hams, MD Not Taking Active   lisinopril-hydrochlorothiazide (ZESTORETIC) 20-25 MG tablet [id number] No Take 1 tablet by mouth daily.  Patient not taking: Reported on 07/29/2021   Luetta Nutting, DO Not Taking Active   magnesium gluconate (MAGONATE) 500 MG tablet [id number] Yes Take 500 mg by mouth 3 (three) times a week. [provider] Taking Active Self  spironolactone (ALDACTONE) 25 MG tablet [id number] Yes Take 1 tablet (25 mg total) by mouth daily. 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   Medication Assistance:  TBD  Patient's preferred pharmacy [facility]:  [id number] [place], [place] [address] [place] [address] Phone: [phone] Fax: [phone]  Uses pill box? No - original bottles &  working well Pt endorses 100% compliance  Follow Up:  Patient agrees to Care Plan and Follow-up.  Plan: Telephone follow up appointment with care management team member scheduled for:  2 weeks  Darius Bump

## 2021-08-11 NOTE — Telephone Encounter (Signed)
Unsuccessful attempt x2 to contact patient for follow-up phone call for CCM services with pharmacist.  Will route to schedule team for reschedule.

## 2021-08-12 ENCOUNTER — Telehealth: Payer: Self-pay | Admitting: *Deleted

## 2021-08-12 NOTE — Chronic Care Management (AMB) (Signed)
  Care Management   Note  08/12/2021 Name: BRYDEN DARDEN MRN: [id number] DOB: [birth year]  MAESON PUROHIT is a 65 y.o. year old male who is a primary care patient of Luetta Nutting, DO and is actively engaged with the care management team. I reached out to [employer] by phone today to assist with re-scheduling a follow up visit with the Pharmacist  Follow up plan: Unsuccessful telephone outreach attempt made. A HIPAA compliant phone message was left for the patient providing contact information and requesting a return call.   Julian Hy, [place] Management  Direct Dial: [phone]

## 2021-08-17 NOTE — Chronic Care Management (AMB) (Signed)
  Care Management   Note  08/17/2021 Name: Russell Garcia MRN: [id number] DOB: [birth year]  Russell Garcia is a 65 y.o. year old male who is a primary care patient of Luetta Nutting, DO and is actively engaged with the care management team. I reached out to [employer] by phone today to assist with re-scheduling a follow up visit with the Pharmacist  Follow up plan: 2nd Unsuccessful telephone outreach attempt made. A HIPAA compliant phone message was left for the patient providing contact information and requesting a return call.   Julian Hy, [place] Management  Direct Dial: [phone]

## 2021-08-24 DIAGNOSIS — E785 Hyperlipidemia, unspecified: Secondary | ICD-10-CM

## 2021-08-24 DIAGNOSIS — I10 Essential (primary) hypertension: Secondary | ICD-10-CM | POA: Diagnosis not present

## 2021-09-09 NOTE — Chronic Care Management (AMB) (Signed)
  Care Management   Note  09/09/2021 Name: Russell Garcia MRN: [id number] DOB: Nov 05, 1955  Russell Garcia is a 65 y.o. year old male who is a primary care patient of Luetta Nutting, DO and is actively engaged with the care management team. I reached out to [employer] by phone today to assist with re-scheduling a follow up visit with the Pharmacist  Follow up plan: We have been unable to make contact with the patient for follow up.    Julian Hy, [place] Management  Direct Dial: [phone]

## 2021-09-18 ENCOUNTER — Other Ambulatory Visit: Payer: Self-pay

## 2021-09-18 ENCOUNTER — Ambulatory Visit
Admission: EM | Admit: 2021-09-18 | Discharge: 2021-09-18 | Disposition: A | Discharge status: Home / Self Care | Payer: Medicare HMO | Attending: Physician Assistant | Admitting: Physician Assistant

## 2021-09-18 ENCOUNTER — Encounter: Payer: Self-pay | Admitting: Emergency Medicine

## 2021-09-18 DIAGNOSIS — J069 Acute upper respiratory infection, unspecified: Secondary | ICD-10-CM

## 2021-09-18 LAB — POCT INFLUENZA A/B
Influenza A, POC: NEGATIVE
Influenza B, POC: NEGATIVE

## 2021-09-18 NOTE — ED Provider Notes (Signed)
[facility]    CSN: [id number] Arrival date & time: 09/18/21  0915      History   Chief Complaint Chief Complaint  Patient presents with   Fever   Fatigue    HPI Russell Garcia is a 65 y.o. male.   Here today for evaluation of cough, congestion, fever, fatigue, and headache that started 3 days ago.  He states symptoms have improved somewhat with time.  He has taken over-the-counter medication with mild relief.  The history is provided by the patient.  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)   Hypertension 2009    Patient Active Problem List   Diagnosis Date Noted   Urinary retention 08/14/2020   Neurogenic claudication ([place]) 03/11/2020   Carpal tunnel syndrome of left wrist 08/06/2019   Status post total replacement of right hip 06/07/2019   Right hip pain 05/14/2016   Left lumbar radiculopathy 05/14/2016   Dermatitis 03/09/2011   Hyperlipidemia 03/09/2011   NUMBNESS 05/16/2010   ERECTILE DYSFUNCTION, NON-ORGANIC 04/22/2010   HTN (hypertension) 07/31/2008   Morbid obesity ([place]) 08/22/2007   HERNIA, HX OF 08/16/2007   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 Authorizing Provider  amLODipine (NORVASC) 10 MG tablet Take 1 tablet (10 mg total) by mouth daily. 11/17/20    Luetta Nutting, DO  aspirin EC 325 MG tablet Take 325 mg by mouth daily.    [provider]  atorvastatin (LIPITOR) 20 MG tablet Take 1 tablet (20 mg total) by mouth daily. 08/14/20   Luetta Nutting, DO  carvedilol (COREG) 12.5 MG tablet Take 1 tablet (12.5 mg total) by mouth 2 (two) times daily with a meal. 11/17/20   Luetta Nutting, DO  diclofenac sodium (VOLTAREN) 1 % GEL Apply 2 g topically 4 (four) times daily as needed (pain). Patient not taking: Reported on 07/29/2021 08/06/19   Gregor Hams, MD  lisinopril-hydrochlorothiazide (ZESTORETIC) 20-25 MG tablet Take 1 tablet by mouth daily. Patient not taking: Reported on 07/29/2021 11/17/20   Luetta Nutting, DO  magnesium gluconate (MAGONATE) 500 MG tablet Take 500 mg by mouth 3 (three) times a week.    [provider]  spironolactone (ALDACTONE) 25 MG tablet Take 1 tablet (25 mg total) by mouth daily. 11/17/20   Luetta Nutting, DO    Family History Family History  Problem Relation Age of Onset   Leukemia Father    Heart attack Mother 71   Diabetes Neg Hx    Colon cancer Neg Hx    Prostate cancer Neg Hx     Social History Social History   Tobacco Use   Smoking status: Never   Smokeless tobacco: Never  Vaping Use  Vaping Use: Never used  Substance Use Topics   Alcohol use: No    Alcohol/week: 0.0 standard drinks   Drug use: No     Allergies   Patient has no known allergies.   Review of Systems Review of Systems  Constitutional:  Positive for fever. Negative for chills.  HENT:  Positive for congestion and sore throat. Negative for ear pain.   Eyes:  Negative for discharge and redness.  Respiratory:  Positive for cough. Negative for shortness of breath.   Gastrointestinal:  Negative for abdominal pain, nausea and vomiting.    Physical Exam Triage Vital Signs ED Triage Vitals  Enc Vitals Group     BP 09/18/21 1036 (!) 183/125     Pulse Rate 09/18/21 1036 77     Resp 09/18/21 1036 16     Temp  09/18/21 1036 97.8 F (36.6 C)     Temp Source 09/18/21 1036 Oral     SpO2 09/18/21 1036 95 %     Weight --      Height --      Head Circumference --      Peak Flow --      Pain Score 09/18/21 1037 0     Pain Loc --      Pain Edu? --      Excl. in [place]? --    No data found.  Updated Vital Signs BP (!) 183/125 (BP Location: Right Arm) Comment: States he did not take his BP this morning  Pulse 77   Temp 97.8 F (36.6 C) (Oral)   Resp 16   SpO2 95%    Physical Exam Vitals and nursing note reviewed.  Constitutional:      General: He is not in acute distress.    Appearance: Normal appearance. He is not ill-appearing.  HENT:     Head: Normocephalic and atraumatic.     Nose: Congestion present.     Mouth/Throat:     Mouth: Mucous membranes are moist.     Pharynx: Oropharynx is clear. No oropharyngeal exudate or posterior oropharyngeal erythema.  Eyes:     Conjunctiva/sclera: Conjunctivae normal.  Cardiovascular:     Rate and Rhythm: Normal rate and regular rhythm.     Heart sounds: Normal heart sounds. No murmur heard. Pulmonary:     Effort: Pulmonary effort is normal. No respiratory distress.     Breath sounds: Normal breath sounds. No wheezing, rhonchi or rales.  Skin:    General: Skin is warm and dry.  Neurological:     Mental Status: He is alert.  Psychiatric:        Mood and Affect: Mood normal.        Thought Content: Thought content normal.     UC Treatments / Results  Labs (all labs ordered are listed, but only abnormal results are displayed) Labs Reviewed  NOVEL CORONAVIRUS, NAA  POCT INFLUENZA A/B    EKG   Radiology No results found.  Procedures Procedures (including critical care time)  Medications Ordered in UC Medications - No data to display  Initial Impression / Assessment and Plan / UC Course  I have reviewed the triage vital signs and the nursing notes.  Pertinent labs & imaging results that were available during my care of the  patient were reviewed by me and considered in my medical decision making (see chart for details).    Flu test negative in office.  Suspect other viral etiology and will screen for COVID.  Recommended  symptomatic treatment.  Blood pressure is extremely elevated today, however patient reports that he has not taken his blood pressure medicine yet.  He denies any current chest pain or shortness of breath.  He does not report headache.  Final Clinical Impressions(s) / UC Diagnoses   Final diagnoses:  Acute upper respiratory infection   Discharge Instructions   None    ED Prescriptions   None    PDMP not reviewed this encounter.   Francene Finders, PA-C 09/18/21 1141

## 2021-09-18 NOTE — ED Triage Notes (Signed)
Fever, fatigue, cough, nasal congestion, headache starting Tuesday. Denies N/V/D, generalized body aches

## 2021-09-19 LAB — NOVEL CORONAVIRUS, NAA: SARS-CoV-2, NAA: DETECTED — AB

## 2021-09-19 LAB — SARS-COV-2, NAA 2 DAY TAT

## 2021-10-15 ENCOUNTER — Ambulatory Visit: Payer: Medicare HMO | Admitting: Family Medicine

## 2021-10-16 ENCOUNTER — Ambulatory Visit (INDEPENDENT_AMBULATORY_CARE_PROVIDER_SITE_OTHER): Payer: Self-pay | Admitting: Sports Medicine

## 2021-10-16 ENCOUNTER — Ambulatory Visit (INDEPENDENT_AMBULATORY_CARE_PROVIDER_SITE_OTHER): Payer: Self-pay

## 2021-10-16 ENCOUNTER — Other Ambulatory Visit: Payer: Self-pay

## 2021-10-16 DIAGNOSIS — S1980XA Other specified injuries of unspecified part of neck, initial encounter: Secondary | ICD-10-CM

## 2021-10-16 DIAGNOSIS — R222 Localized swelling, mass and lump, trunk: Secondary | ICD-10-CM | POA: Diagnosis not present

## 2021-10-16 DIAGNOSIS — R221 Localized swelling, mass and lump, neck: Secondary | ICD-10-CM | POA: Diagnosis not present

## 2021-10-16 DIAGNOSIS — R220 Localized swelling, mass and lump, head: Secondary | ICD-10-CM | POA: Diagnosis not present

## 2021-10-16 MED ORDER — PREDNISONE 50 MG PO TABS: ORAL_TABLET | ORAL | 0 refills | Status: DC

## 2021-10-16 MED ORDER — CYCLOBENZAPRINE HCL 10 MG PO TABS: ORAL_TABLET | ORAL | 0 refills | Status: DC
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
## 2021-10-28 ENCOUNTER — Ambulatory Visit (INDEPENDENT_AMBULATORY_CARE_PROVIDER_SITE_OTHER): Payer: Medicare HMO | Admitting: Sports Medicine

## 2021-10-28 ENCOUNTER — Other Ambulatory Visit: Payer: Self-pay

## 2021-10-28 DIAGNOSIS — S0990XD Unspecified injury of head, subsequent encounter: Secondary | ICD-10-CM

## 2021-10-28 DIAGNOSIS — M79642 Pain in left hand: Secondary | ICD-10-CM

## 2021-10-28 DIAGNOSIS — M5030 Other cervical disc degeneration, unspecified cervical region: Secondary | ICD-10-CM

## 2021-10-28 DIAGNOSIS — S0990XA Unspecified injury of head, initial encounter: Secondary | ICD-10-CM | POA: Insufficient documentation
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
## 2021-10-28 NOTE — Assessment & Plan Note (Signed)
Also complaining of pain in the left hand, third MCP, adding x-rays, suspect arthritis. Advised him to buddy tape second and third fingers together.
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
## 2021-10-29 LAB — URINALYSIS W MICROSCOPIC + REFLEX CULTURE
Bacteria, UA: NONE SEEN /HPF
Bilirubin Urine: NEGATIVE
Glucose, UA: NEGATIVE
Hgb urine dipstick: NEGATIVE
Hyaline Cast: NONE SEEN /LPF
Ketones, ur: NEGATIVE
Leukocyte Esterase: NEGATIVE
Nitrites, Initial: NEGATIVE
RBC / HPF: NONE SEEN /HPF (ref 0–2)
Specific Gravity, Urine: 1.024 (ref 1.001–1.035)
Squamous Epithelial / HPF: NONE SEEN /HPF (ref <=5)
WBC, UA: NONE SEEN /HPF (ref 0–5)
pH: <=5 (ref 5.0–8.0)

## 2021-10-29 LAB — SODIUM, URINE, RANDOM: Sodium, Ur: 50 mmol/L (ref 28–272)

## 2021-10-29 LAB — NO CULTURE INDICATED

## 2021-11-02 ENCOUNTER — Ambulatory Visit (INDEPENDENT_AMBULATORY_CARE_PROVIDER_SITE_OTHER): Payer: Medicare HMO

## 2021-11-02 ENCOUNTER — Other Ambulatory Visit: Payer: Self-pay | Admitting: Sports Medicine

## 2021-11-02 ENCOUNTER — Other Ambulatory Visit: Payer: Self-pay

## 2021-11-02 DIAGNOSIS — R32 Unspecified urinary incontinence: Secondary | ICD-10-CM

## 2021-11-02 DIAGNOSIS — S0990XD Unspecified injury of head, subsequent encounter: Secondary | ICD-10-CM | POA: Diagnosis not present

## 2021-11-02 DIAGNOSIS — R202 Paresthesia of skin: Secondary | ICD-10-CM

## 2021-11-02 DIAGNOSIS — M2578 Osteophyte, vertebrae: Secondary | ICD-10-CM | POA: Diagnosis not present

## 2021-11-02 DIAGNOSIS — S1980XD Other specified injuries of unspecified part of neck, subsequent encounter: Secondary | ICD-10-CM | POA: Diagnosis not present

## 2021-11-02 DIAGNOSIS — S0990XA Unspecified injury of head, initial encounter: Secondary | ICD-10-CM | POA: Diagnosis not present

## 2021-11-02 DIAGNOSIS — S6992XA Unspecified injury of left wrist, hand and finger(s), initial encounter: Secondary | ICD-10-CM | POA: Diagnosis not present

## 2021-11-02 DIAGNOSIS — M79642 Pain in left hand: Secondary | ICD-10-CM

## 2021-11-02 DIAGNOSIS — M19042 Primary osteoarthritis, left hand: Secondary | ICD-10-CM | POA: Diagnosis not present

## 2021-11-02 DIAGNOSIS — R200 Anesthesia of skin: Secondary | ICD-10-CM | POA: Diagnosis not present

## 2021-11-02 DIAGNOSIS — I6782 Cerebral ischemia: Secondary | ICD-10-CM | POA: Diagnosis not present

## 2021-11-02 DIAGNOSIS — M4802 Spinal stenosis, cervical region: Secondary | ICD-10-CM | POA: Diagnosis not present

## 2021-11-13 ENCOUNTER — Ambulatory Visit: Payer: Medicare HMO | Admitting: Sports Medicine

## 2021-11-16 ENCOUNTER — Ambulatory Visit (INDEPENDENT_AMBULATORY_CARE_PROVIDER_SITE_OTHER): Payer: Medicare HMO

## 2021-11-16 ENCOUNTER — Ambulatory Visit (INDEPENDENT_AMBULATORY_CARE_PROVIDER_SITE_OTHER): Payer: Medicare HMO | Admitting: Sports Medicine

## 2021-11-16 ENCOUNTER — Other Ambulatory Visit: Payer: Self-pay

## 2021-11-16 DIAGNOSIS — R339 Retention of urine, unspecified: Secondary | ICD-10-CM | POA: Diagnosis not present

## 2021-11-16 DIAGNOSIS — M5030 Other cervical disc degeneration, unspecified cervical region: Secondary | ICD-10-CM | POA: Diagnosis not present

## 2021-11-16 DIAGNOSIS — M79642 Pain in left hand: Secondary | ICD-10-CM | POA: Diagnosis not present

## 2021-11-16 DIAGNOSIS — R200 Anesthesia of skin: Secondary | ICD-10-CM

## 2021-11-16 MED ORDER — GABAPENTIN 300 MG PO CAPS: 300.0000 mg | ORAL_CAPSULE | Freq: Every day | ORAL | 3 refills | Status: DC
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
## 2021-11-16 NOTE — Assessment & Plan Note (Signed)
Russell Garcia has multilevel cervical DDD, he has had pain for months now, predominately axial in the left side of the neck. We will proceed with a left-sided cervical epidural as well as Neurontin at night as he does have some insomnia due to his discomfort. Return to see me 4 weeks after the epidural.
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
## 2021-11-20 ENCOUNTER — Ambulatory Visit
Admission: RE | Admit: 2021-11-20 | Discharge: 2021-11-20 | Disposition: A | Discharge status: Home / Self Care | Payer: Medicare HMO | Source: Ambulatory Visit | Attending: Sports Medicine | Admitting: Sports Medicine

## 2021-11-20 ENCOUNTER — Other Ambulatory Visit: Payer: Self-pay

## 2021-11-20 DIAGNOSIS — M5010 Cervical disc disorder with radiculopathy, unspecified cervical region: Secondary | ICD-10-CM | POA: Diagnosis not present

## 2021-11-20 DIAGNOSIS — M5030 Other cervical disc degeneration, unspecified cervical region: Secondary | ICD-10-CM

## 2021-11-20 MED ORDER — TRIAMCINOLONE ACETONIDE 40 MG/ML IJ SUSP (RADIOLOGY)
60.0000 mg | Freq: Once | INTRAMUSCULAR | Status: AC
  Administered 2021-11-20: 60 mg via EPIDURAL

## 2021-11-20 MED ORDER — IOPAMIDOL (ISOVUE-M 300) INJECTION 61%
1.0000 mL | Freq: Once | INTRAMUSCULAR | Status: AC | PRN
  Administered 2021-11-20: 1 mL via EPIDURAL

## 2021-11-20 NOTE — Discharge Instructions (Signed)

## 2021-11-25 ENCOUNTER — Ambulatory Visit: Payer: Medicare HMO | Admitting: Family Medicine

## 2021-11-25 ENCOUNTER — Ambulatory Visit: Payer: Medicare HMO | Admitting: Sports Medicine

## 2021-12-04 ENCOUNTER — Other Ambulatory Visit: Payer: Self-pay

## 2021-12-04 DIAGNOSIS — I10 Essential (primary) hypertension: Secondary | ICD-10-CM

## 2021-12-04 DIAGNOSIS — S1980XA Other specified injuries of unspecified part of neck, initial encounter: Secondary | ICD-10-CM

## 2021-12-04 MED ORDER — SPIRONOLACTONE 25 MG PO TABS: 25.0000 mg | ORAL_TABLET | Freq: Every day | ORAL | 2 refills | Status: DC

## 2021-12-04 MED ORDER — LISINOPRIL-HYDROCHLOROTHIAZIDE 20-25 MG PO TABS: 1.0000 | ORAL_TABLET | Freq: Every day | ORAL | 2 refills | Status: DC

## 2021-12-04 MED ORDER — CARVEDILOL 12.5 MG PO TABS: 12.5000 mg | ORAL_TABLET | Freq: Two times a day (BID) | ORAL | 2 refills | Status: DC

## 2021-12-04 MED ORDER — CYCLOBENZAPRINE HCL 10 MG PO TABS: ORAL_TABLET | ORAL | 2 refills | Status: DC

## 2021-12-04 MED ORDER — AMLODIPINE BESYLATE 10 MG PO TABS: 10.0000 mg | ORAL_TABLET | Freq: Every day | ORAL | 2 refills | Status: DC

## 2021-12-28 ENCOUNTER — Other Ambulatory Visit: Payer: Self-pay | Admitting: Sports Medicine

## 2021-12-28 DIAGNOSIS — S1980XA Other specified injuries of unspecified part of neck, initial encounter: Secondary | ICD-10-CM

## 2022-02-08 ENCOUNTER — Telehealth: Payer: Self-pay | Admitting: Orthopaedic Surgery

## 2022-02-08 DIAGNOSIS — M5416 Radiculopathy, lumbar region: Secondary | ICD-10-CM

## 2022-02-08 NOTE — Telephone Encounter (Signed)
Called patient,voice mail not set up

## 2022-02-08 NOTE — Telephone Encounter (Signed)
Pt called and would like to know who you are recommending for him to see for his back surgery?  ? ?CB [phone]  ?

## 2022-02-09 ENCOUNTER — Other Ambulatory Visit: Payer: Self-pay | Admitting: Family Medicine

## 2022-02-09 DIAGNOSIS — I10 Essential (primary) hypertension: Secondary | ICD-10-CM

## 2022-02-09 NOTE — Addendum Note (Signed)
Addended by: Robyne Peers on: 02/09/2022 03:19 PM ? ? Modules accepted: Orders ? ?

## 2022-02-09 NOTE — Telephone Encounter (Signed)
Referral was placed in the chart. Pt was called and advised and stated understanding  ?

## 2022-04-12 ENCOUNTER — Ambulatory Visit (INDEPENDENT_AMBULATORY_CARE_PROVIDER_SITE_OTHER): Payer: Medicare HMO | Admitting: Family Medicine

## 2022-04-12 ENCOUNTER — Encounter: Payer: Self-pay | Admitting: Family Medicine

## 2022-04-12 VITALS — BP 147/89 | HR 100 | Ht 72.0 in | Wt 303.0 lb

## 2022-04-12 DIAGNOSIS — M5416 Radiculopathy, lumbar region: Secondary | ICD-10-CM

## 2022-04-12 DIAGNOSIS — Z89511 Acquired absence of right leg below knee: Secondary | ICD-10-CM | POA: Diagnosis not present

## 2022-04-12 DIAGNOSIS — I10 Essential (primary) hypertension: Secondary | ICD-10-CM | POA: Diagnosis not present
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
## 2022-04-12 NOTE — Assessment & Plan Note (Signed)
Having increasing radicular symptoms.  Referral placed to neurosurgery.

## 2022-04-12 NOTE — Assessment & Plan Note (Signed)
Blood pressure elevated today.  Recommend continuation of medications, he will take this when he returns home.  Recommend nurse visit in 2 to 3 weeks to recheck blood pressure.

## 2022-05-06 DIAGNOSIS — M5416 Radiculopathy, lumbar region: Secondary | ICD-10-CM | POA: Diagnosis not present

## 2022-05-07 ENCOUNTER — Other Ambulatory Visit: Payer: Self-pay | Admitting: Neurological Surgery

## 2022-05-07 DIAGNOSIS — M5416 Radiculopathy, lumbar region: Secondary | ICD-10-CM

## 2022-05-18 ENCOUNTER — Ambulatory Visit
Admission: RE | Admit: 2022-05-18 | Discharge: 2022-05-18 | Disposition: A | Discharge status: Home / Self Care | Payer: Medicare HMO | Source: Ambulatory Visit | Attending: Neurological Surgery | Admitting: Neurological Surgery

## 2022-05-18 DIAGNOSIS — M4807 Spinal stenosis, lumbosacral region: Secondary | ICD-10-CM | POA: Diagnosis not present

## 2022-05-18 DIAGNOSIS — M545 Low back pain, unspecified: Secondary | ICD-10-CM | POA: Diagnosis not present

## 2022-05-18 DIAGNOSIS — M48061 Spinal stenosis, lumbar region without neurogenic claudication: Secondary | ICD-10-CM | POA: Diagnosis not present

## 2022-05-18 DIAGNOSIS — M5416 Radiculopathy, lumbar region: Secondary | ICD-10-CM

## 2022-06-16 ENCOUNTER — Encounter: Payer: Self-pay | Admitting: General Practice

## 2022-09-13 ENCOUNTER — Other Ambulatory Visit: Payer: Self-pay | Admitting: Sports Medicine

## 2022-09-13 DIAGNOSIS — S1980XA Other specified injuries of unspecified part of neck, initial encounter: Secondary | ICD-10-CM

## 2022-09-29 ENCOUNTER — Telehealth: Payer: Self-pay

## 2022-09-29 NOTE — Telephone Encounter (Signed)
Please call pt to schedule appt for cough x 2 weeks.

## 2022-09-30 NOTE — Telephone Encounter (Signed)
Scheduled pt for 10/05/2022 @ 8:50. Tvt

## 2022-10-05 ENCOUNTER — Ambulatory Visit (INDEPENDENT_AMBULATORY_CARE_PROVIDER_SITE_OTHER): Payer: Medicare HMO

## 2022-10-05 ENCOUNTER — Ambulatory Visit (INDEPENDENT_AMBULATORY_CARE_PROVIDER_SITE_OTHER): Payer: Medicare HMO | Admitting: Family Medicine

## 2022-10-05 ENCOUNTER — Encounter: Payer: Self-pay | Admitting: Family Medicine

## 2022-10-05 VITALS — BP 129/89 | HR 88 | Ht 72.0 in | Wt 305.0 lb

## 2022-10-05 DIAGNOSIS — Z23 Encounter for immunization: Secondary | ICD-10-CM

## 2022-10-05 DIAGNOSIS — R059 Cough, unspecified: Secondary | ICD-10-CM | POA: Diagnosis not present

## 2022-10-05 DIAGNOSIS — I10 Essential (primary) hypertension: Secondary | ICD-10-CM

## 2022-10-05 DIAGNOSIS — M5416 Radiculopathy, lumbar region: Secondary | ICD-10-CM | POA: Diagnosis not present

## 2022-10-05 DIAGNOSIS — R052 Subacute cough: Secondary | ICD-10-CM | POA: Diagnosis not present

## 2022-10-05 DIAGNOSIS — N529 Male erectile dysfunction, unspecified: Secondary | ICD-10-CM

## 2022-10-05 MED ORDER — PREDNISONE 50 MG PO TABS: ORAL_TABLET | ORAL | 0 refills | Status: DC

## 2022-10-05 MED ORDER — TADALAFIL 20 MG PO TABS: 10.0000 mg | ORAL_TABLET | ORAL | 3 refills | Status: DC | PRN

## 2022-10-05 MED ORDER — BENZONATATE 200 MG PO CAPS: 200.0000 mg | ORAL_CAPSULE | Freq: Two times a day (BID) | ORAL | 0 refills | Status: DC | PRN

## 2022-10-05 NOTE — Assessment & Plan Note (Signed)
Chest x-ray ordered.  Adding burst of prednisone as well as [employer] as needed.  Recommend increase fluid intake humidifier addition of Flonase given recent allergen exposure.  Contact clinic if not improving.

## 2022-10-05 NOTE — Assessment & Plan Note (Signed)
He was referred to neurosurgery.  Says that they are planning on surgical intervention for his low back pain.

## 2022-10-05 NOTE — Assessment & Plan Note (Signed)
Blood pressure well-controlled at this time.  Recommend continuation of current medication for management of hypertension.

## 2022-10-05 NOTE — Patient Instructions (Addendum)
Have chest xray completed.  Add flonase daily.  Add burst of prednisone '50mg'$  daily x 5 days. Tessalon perles as needed for cough. Use humidifier at home.  Stay well hydrated.   Tadalafil as needed for ED.  If you call ems or go to the ER with chest pain let them know if you have taken medication in the past 48 hours.

## 2022-10-05 NOTE — Assessment & Plan Note (Addendum)
We discussed options for management of ED.  He would like to try tadalafil.  Common side effects including potential headaches and nasal congestion.  Discussed chest pain and prolonged erection precautions

## 2022-10-05 NOTE — Progress Notes (Signed)
Russell Garcia - 66 y.o. male MRN [id number]  Date of birth: [birth year]  Subjective Chief Complaint  Patient presents with   Hypertension    HPI Russell Garcia is a 66 y.o. male here today with complaint of cough.  He has had cough for a few weeks..  Cough is productive of clear to yellow sputum.,  Feels like he has upper chest congestion.  Chest pain or significant wheezing.  Denies fever or chills.  He has tried over-the-counter Mucinex and cough medication with slight improvement. He has been working on a home recently with mold as well as another home with several pets.  He is interested in adding medication to help with ED.  He is articulating in the past but is purchased pills over-the-counter which has helped.  He is planning on having surgery lumbar spine.  Has met with Dr. Ronnald Ramp at [facility] neurosurgery.  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)   Hypertension 2009    Past Surgical History:  Procedure Laterality Date   INGUINAL HERNIA REPAIR     R BKA     TOTAL HIP ARTHROPLASTY Right 06/07/2019   TOTAL HIP ARTHROPLASTY Right 06/07/2019   Procedure: RIGHT TOTAL HIP ARTHROPLASTY ANTERIOR APPROACH;  Surgeon: Mcarthur Rossetti, MD;  Location: [facility] [place] [facility];  Service: Orthopedics;  Laterality: Right;    Social History   Socioeconomic History   Marital status: Divorced    Spouse name: Not on file   Number of children: 4   Years of education: Not on file   Highest education level: Not on file  Occupational History   Occupation: painter  Tobacco Use   Smoking status: Never   Smokeless tobacco: Never  Vaping Use   Vaping Use: Never used  Substance and Sexual Activity   Alcohol use: No    Alcohol/week: 0.0 standard drinks of alcohol   Drug use: No   Sexual activity: Yes     Partners: Female  Other Topics Concern   Not on file  Social History Narrative            Social Determinants of Health   Financial Resource Strain: Not on file  Food Insecurity: Not on file  Transportation Needs: Not on file  Physical Activity: Not on file  Stress: Not on file  Social Connections: Not on file    Family History  Problem Relation Age of Onset   Leukemia Father    Heart attack Mother 5   Diabetes Neg Hx    Colon cancer Neg Hx    Prostate cancer Neg Hx     Health Maintenance  Topic Date Due   Medicare Annual Wellness (AWV)  11/05/2022 (Originally 01/08/2020)   COVID-19 Vaccine (3 - 2023-24 season) 11/25/2022 (Originally 06/25/2022)   Zoster Vaccines- Shingrix (1 of 2) 01/04/2023 (Originally 08/28/2006)   Pneumonia Vaccine 7+ Years old (1 - PCV) 01/14/2023 (Originally 08/28/2021)   Hepatitis C Screening  04/13/2023 (Originally 08/28/1974)   COLONOSCOPY (Pts 45-60yr Insurance coverage will need to be confirmed)  11/22/2024   DTaP/Tdap/Td (5 - Td or Tdap) 06/15/2026   INFLUENZA VACCINE  Completed   HPV VACCINES  Aged Out     ----------------------------------------------------------------------------------------------------------------------------------------------------------------------------------------------------------------- Physical Exam BP  129/89 (BP Location: Left Arm, Patient Position: Sitting, Cuff Size: Large)   Pulse 88   Ht 6' (1.829 m)   Wt (!) 305 lb (138.3 kg)   SpO2 96%   BMI 41.37 kg/m   Physical Exam Constitutional:      Appearance: Normal appearance.  HENT:     Head: Normocephalic and atraumatic.     Right Ear: Tympanic membrane normal.     Left Ear: Tympanic membrane normal.  Eyes:     General: No scleral icterus. Cardiovascular:     Rate and Rhythm: Normal rate and regular rhythm.  Pulmonary:     Effort: Pulmonary effort is normal.     Comments: Slight expiratory wheeze. Musculoskeletal:     Cervical back: Neck  supple.  Neurological:     Mental Status: He is alert.  Psychiatric:        Mood and Affect: Mood normal.        Behavior: Behavior normal.     ------------------------------------------------------------------------------------------------------------------------------------------------------------------------------------------------------------------- Assessment and Plan  HTN (hypertension) Blood pressure well-controlled at this time.  Recommend continuation of current medication for management of hypertension.  Left lumbar radiculopathy He was referred to neurosurgery.  Says that they are planning on surgical intervention for his low back pain.  Erectile dysfunction We discussed options for management of ED.  He would like to try tadalafil.  Common side effects including potential headaches and nasal congestion.  Discussed chest pain and prolonged erection precautions  Subacute cough Chest x-ray ordered.  Adding burst of prednisone as well as [employer] as needed.  Recommend increase fluid intake humidifier addition of Flonase given recent allergen exposure.  Contact clinic if not improving.   Meds ordered this encounter  Medications   predniSONE (DELTASONE) 50 MG tablet    Sig: Take 39m daily x5 days.    Dispense:  5 tablet    Refill:  0   benzonatate (TESSALON) 200 MG capsule    Sig: Take 1 capsule (200 mg total) by mouth 2 (two) times daily as needed for cough.    Dispense:  20 capsule    Refill:  0   tadalafil (CIALIS) 20 MG tablet    Sig: Take 0.5-1 tablets (10-20 mg total) by mouth every other day as needed for erectile dysfunction.    Dispense:  10 tablet    Refill:  3    No follow-ups on file.    This visit occurred during the SARS-CoV-2 public health emergency.  Safety protocols were in place, including screening questions prior to the visit, additional usage of staff PPE, and extensive cleaning of exam room while observing appropriate contact time as  indicated for disinfecting solutions.

## 2022-10-06 ENCOUNTER — Telehealth: Payer: Self-pay

## 2022-10-07 NOTE — Telephone Encounter (Signed)
Pt requested letter stating back surgery was necessary.

## 2022-10-08 ENCOUNTER — Encounter: Payer: Self-pay | Admitting: Family Medicine

## 2022-10-08 NOTE — Telephone Encounter (Signed)
I can't really recommend whether he should go forth with surgery or not as this would be out of my scope and under the recommendations of the neurosurgeon.  As far as clearance for surgery I can type a letter regarding him being cleared for surgery based on risk factors.  Is this what he is requesting?

## 2022-10-08 NOTE — Telephone Encounter (Signed)
[facility] Neurosurgery. Pt was seen by Dr. Sherley Bounds @ [place] [phone]). The office is closed today.   Advised Mr. Eblen I would attempt to contact them on Monday concerning his surgery need letter.

## 2022-10-21 DIAGNOSIS — Z6841 Body Mass Index (BMI) 40.0 and over, adult: Secondary | ICD-10-CM | POA: Diagnosis not present

## 2022-10-21 DIAGNOSIS — M48062 Spinal stenosis, lumbar region with neurogenic claudication: Secondary | ICD-10-CM | POA: Diagnosis not present

## 2022-11-21 HISTORY — PX: BACK SURGERY: SHX140

## 2022-11-22 DIAGNOSIS — M48062 Spinal stenosis, lumbar region with neurogenic claudication: Secondary | ICD-10-CM | POA: Diagnosis not present

## 2022-11-22 DIAGNOSIS — M4316 Spondylolisthesis, lumbar region: Secondary | ICD-10-CM | POA: Diagnosis not present

## 2022-11-22 DIAGNOSIS — M47816 Spondylosis without myelopathy or radiculopathy, lumbar region: Secondary | ICD-10-CM | POA: Diagnosis not present

## 2022-11-22 DIAGNOSIS — M5136 Other intervertebral disc degeneration, lumbar region: Secondary | ICD-10-CM | POA: Diagnosis not present

## 2022-11-27 ENCOUNTER — Other Ambulatory Visit: Payer: Self-pay | Admitting: Sports Medicine

## 2022-11-27 DIAGNOSIS — S1980XA Other specified injuries of unspecified part of neck, initial encounter: Secondary | ICD-10-CM

## 2022-12-02 ENCOUNTER — Telehealth: Payer: Self-pay

## 2022-12-02 NOTE — Telephone Encounter (Signed)
Please return call and review medications that he has or have him schedule virtual visit to go over his medications.    Thanks!  CM

## 2022-12-02 NOTE — Telephone Encounter (Signed)
Russell Garcia is requesting a return call. He wants to make sure he's taking the correct medications.

## 2022-12-03 NOTE — Telephone Encounter (Signed)
Spoke to Russell Garcia. He received pain medication for spasms from another Provider. He states the other Provider was aware of his current medications and ordered pain meds. Advised the patient to continue with his current medications. The other provider should/would have advised on any medications to be held.

## 2022-12-03 NOTE — Telephone Encounter (Signed)
Attemped to contact the patient. Russell Garcia

## 2022-12-13 NOTE — Therapy (Incomplete)
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)   Hypertension 2009   Past Surgical History:  Procedure Laterality Date   INGUINAL HERNIA REPAIR     R BKA     TOTAL HIP ARTHROPLASTY Right 06/07/2019   TOTAL HIP ARTHROPLASTY Right 06/07/2019   Procedure: RIGHT TOTAL HIP ARTHROPLASTY ANTERIOR APPROACH;  Surgeon: Mcarthur Rossetti, MD;  Location: [facility];  Service: Orthopedics;  Laterality: Right;   Patient Active Problem List   Diagnosis Date Noted   Erectile dysfunction 10/05/2022   Subacute cough 10/05/2022   DDD (degenerative disc disease), cervical 10/28/2021   Head trauma 10/28/2021   Left hand pain 10/28/2021   Blunt trauma of neck 10/16/2021   Urinary retention 08/14/2020   Neurogenic claudication 03/11/2020   Carpal tunnel syndrome of left wrist 08/06/2019   Status post total replacement of right hip 06/07/2019   Right hip pain 05/14/2016   Left lumbar radiculopathy 05/14/2016   Dermatitis 03/09/2011   Hyperlipidemia 03/09/2011   NUMBNESS 05/16/2010   ERECTILE DYSFUNCTION, NON-ORGANIC 04/22/2010   HTN (hypertension) 07/31/2008   Morbid obesity ([place]) 08/22/2007   HERNIA, HX OF 08/16/2007   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  ONSET DATE: ***  SUBJECTIVE:                                                                                                                                                                                            SUBJECTIVE STATEMENT: ***  PERTINENT HISTORY:  R THA, HTN, Anxiety, Depression, h/o of R BKA  PAIN:  Are you having pain? Yes: NPRS scale: ***/10 Pain location: *** Pain description: *** Aggravating factors: *** Relieving factors: ***  PRECAUTIONS: {Therapy precautions:24002}  WEIGHT BEARING RESTRICTIONS: No  FALLS:  Has patient fallen in last 6 months? 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 Severe multifactorial spinal, lateral recess, and foraminal stenosis at both L3-L4 and L4-L5. Moderate left lateral recess and foraminal stenosis at L5-S1. Query any left-side L3 through S1 radiculitis.  PATIENT SURVEYS:  Modified Oswestry ***  FOTO ***  SCREENING FOR RED FLAGS: Bowel or bladder incontinence: {Yes/No:304960894} Spinal tumors: {Yes/No:304960894} Cauda equina syndrome: {Yes/No:304960894} Compression fracture: {Yes/No:304960894} Abdominal aneurysm: {Yes/No:304960894}  COGNITION: Overall cognitive status: {cognition:24006}     SENSATION: {sensation:27233}  MUSCLE LENGTH: HS: Quads: ITB: Piriformis: Hip Flexors: Heelcords:   POSTURE: {posture:25561}  PALPATION: Palpation: TTP at ***. Increased tissue tension in *** Spinal Mobility: Patellar Mobility:     LUMBAR ROM:   AROM eval  Flexion   Extension   Right lateral flexion   Left lateral flexion   Right rotation   Left rotation    (Blank rows = not tested)  LOWER EXTREMITY ROM:     Active  Right eval Left eval  Hip flexion    Hip extension    Hip abduction    Hip adduction    Hip internal rotation    Hip external  rotation    Knee flexion    Knee extension    Ankle dorsiflexion    Ankle plantarflexion    Ankle inversion    Ankle eversion     (Blank rows = not tested)  LOWER EXTREMITY MMT:    MMT Right eval Left eval  Hip flexion    Hip extension    Hip abduction    Hip adduction    Hip internal rotation    Hip external rotation    Knee flexion    Knee extension    Ankle dorsiflexion    Ankle plantarflexion    Ankle inversion    Ankle eversion     (Blank rows = not tested)  LUMBAR SPECIAL TESTS:  {lumbar special test:25242}  FUNCTIONAL TESTS:  {Functional tests:24029}  GAIT: Distance walked: *** Assistive device utilized: {Assistive devices:23999} Level of assistance: {Levels of assistance:24026} Comments: ***  TODAY'S TREATMENT:                                                                                                                              DATE: ***  See pt ed  PATIENT EDUCATION:  Education details: *** Person educated: {Person educated:25204} Education method: {Education Method:25205} Education comprehension: {Education Comprehension:25206}  HOME EXERCISE PROGRAM: ***  ASSESSMENT:  CLINICAL IMPRESSION: Patient is a 67 y.o. male who was seen today for physical therapy evaluation and treatment for ***.   OBJECTIVE IMPAIRMENTS: {opptimpairments:25111}.   ACTIVITY LIMITATIONS: {activitylimitations:27494}  PARTICIPATION LIMITATIONS: {participationrestrictions:25113}  PERSONAL FACTORS: {Personal factors:25162} are also affecting patient's functional outcome.   REHAB POTENTIAL: {rehabpotential:25112}  CLINICAL DECISION MAKING: {clinical decision making:25114}  EVALUATION COMPLEXITY: {Evaluation complexity:25115}   GOALS: Goals reviewed with patient? {yes/no:20286}  SHORT TERM GOALS: Target date: ***  *** Baseline: Goal status: {GOALSTATUS:25110}  2.  *** Baseline:  Goal status: {GOALSTATUS:25110}  3.  *** Baseline:  Goal status:  {GOALSTATUS:25110}  4.  *** Baseline:  Goal status: {GOALSTATUS:25110}  5.  *** Baseline:  Goal status: {GOALSTATUS:25110}  6.  *** Baseline:  Goal status: {GOALSTATUS:25110}  LONG TERM GOALS: Target date: ***  *** Baseline:  Goal status: {GOALSTATUS:25110}  2.  *** Baseline:  Goal status: {GOALSTATUS:25110}  3.  *** Baseline:  Goal status: {GOALSTATUS:25110}  4.  *** Baseline:  Goal status: {GOALSTATUS:25110}  5.  *** Baseline:  Goal status: {GOALSTATUS:25110}  6.  *** Baseline:  Goal status: {GOALSTATUS:25110}  PLAN:  PT FREQUENCY: {rehab frequency:25116}  PT DURATION: {rehab duration:25117}  PLANNED INTERVENTIONS: {rehab planned interventions:25118::"Therapeutic exercises","Therapeutic activity","Neuromuscular re-education","Balance training","Gait training","Patient/Family education","Self Care","Joint mobilization"}.  PLAN FOR NEXT SESSION: ***   Chayson Charters, PT 12/13/2022, 9:17 PM

## 2022-12-14 ENCOUNTER — Ambulatory Visit: Payer: Medicare HMO | Admitting: Physical Therapy

## 2022-12-20 NOTE — Therapy (Incomplete)
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)   Hypertension 2009   Past Surgical History:  Procedure Laterality Date   INGUINAL HERNIA REPAIR     R BKA     TOTAL HIP ARTHROPLASTY Right 06/07/2019   TOTAL HIP ARTHROPLASTY Right 06/07/2019   Procedure: RIGHT TOTAL HIP ARTHROPLASTY ANTERIOR APPROACH;  Surgeon: Mcarthur Rossetti, MD;  Location: [facility];  Service: Orthopedics;  Laterality: Right;   Patient Active Problem List   Diagnosis Date Noted   Erectile dysfunction 10/05/2022   Subacute cough 10/05/2022   DDD (degenerative disc disease), cervical 10/28/2021   Head trauma 10/28/2021   Left hand pain 10/28/2021   Blunt trauma of neck 10/16/2021   Urinary retention 08/14/2020   Neurogenic claudication 03/11/2020   Carpal tunnel syndrome of left wrist 08/06/2019   Status post total replacement of right hip 06/07/2019   Right hip pain 05/14/2016   Left lumbar radiculopathy 05/14/2016   Dermatitis 03/09/2011   Hyperlipidemia 03/09/2011   NUMBNESS 05/16/2010   ERECTILE DYSFUNCTION, NON-ORGANIC 04/22/2010   HTN (hypertension) 07/31/2008   Morbid obesity ([place]) 08/22/2007   HERNIA, HX OF 08/16/2007   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  ONSET DATE: ***  SUBJECTIVE:                                                                                                                                                                                            SUBJECTIVE STATEMENT: ***  PERTINENT HISTORY:  R THA, HTN, Anxiety, Depression, h/o of R BKA  PAIN:  Are you having pain? Yes: NPRS scale: ***/10 Pain location: *** Pain description: *** Aggravating factors: *** Relieving factors: ***  PRECAUTIONS: {Therapy precautions:24002}  WEIGHT BEARING RESTRICTIONS: No  FALLS:  Has patient fallen in last 6 months? 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 Severe multifactorial spinal, lateral recess, and foraminal stenosis at both L3-L4 and L4-L5. Moderate left lateral recess and foraminal stenosis at L5-S1. Query any left-side L3 through S1 radiculitis.  PATIENT SURVEYS:  Modified Oswestry ***  FOTO ***  SCREENING FOR RED FLAGS: Bowel or bladder incontinence: {Yes/No:304960894} Spinal tumors: {Yes/No:304960894} Cauda equina syndrome: {Yes/No:304960894} Compression fracture: {Yes/No:304960894} Abdominal aneurysm: {Yes/No:304960894}  COGNITION: Overall cognitive status: {cognition:24006}     SENSATION: {sensation:27233}  MUSCLE LENGTH: HS: Quads: ITB: Piriformis: Hip Flexors: Heelcords:   POSTURE: {posture:25561}  PALPATION: Palpation: TTP at ***. Increased tissue tension in *** Spinal Mobility: Patellar Mobility:     LUMBAR ROM:   AROM eval  Flexion   Extension   Right lateral flexion   Left lateral flexion   Right rotation   Left rotation    (Blank rows = not tested)  LOWER EXTREMITY ROM:     Active  Right eval Left eval  Hip flexion    Hip extension    Hip abduction    Hip adduction    Hip internal rotation    Hip external  rotation    Knee flexion    Knee extension    Ankle dorsiflexion    Ankle plantarflexion    Ankle inversion    Ankle eversion     (Blank rows = not tested)  LOWER EXTREMITY MMT:    MMT Right eval Left eval  Hip flexion    Hip extension    Hip abduction    Hip adduction    Hip internal rotation    Hip external rotation    Knee flexion    Knee extension    Ankle dorsiflexion    Ankle plantarflexion    Ankle inversion    Ankle eversion     (Blank rows = not tested)  LUMBAR SPECIAL TESTS:  {lumbar special test:25242}  FUNCTIONAL TESTS:  {Functional tests:24029}  GAIT: Distance walked: *** Assistive device utilized: {Assistive devices:23999} Level of assistance: {Levels of assistance:24026} Comments: ***  TODAY'S TREATMENT:                                                                                                                              DATE: ***  12/21/22 See pt ed  PATIENT EDUCATION:  Education details: *** Person educated: {Person educated:25204} Education method: {Education Method:25205} Education comprehension: {Education Comprehension:25206}  HOME EXERCISE PROGRAM: ***  ASSESSMENT:  CLINICAL IMPRESSION: Patient is a 67 y.o. male who was seen today for physical therapy evaluation and treatment for ***.   OBJECTIVE IMPAIRMENTS: {opptimpairments:25111}.   ACTIVITY LIMITATIONS: {activitylimitations:27494}  PARTICIPATION LIMITATIONS: {participationrestrictions:25113}  PERSONAL FACTORS: {Personal factors:25162} are also affecting patient's functional outcome.   REHAB POTENTIAL: {rehabpotential:25112}  CLINICAL DECISION MAKING: {clinical decision making:25114}  EVALUATION COMPLEXITY: {Evaluation complexity:25115}   GOALS: Goals reviewed with patient? {yes/no:20286}  SHORT TERM GOALS: Target date: ***  *** Baseline: Goal status: {GOALSTATUS:25110}  2.  *** Baseline:  Goal status: {  GOALSTATUS:25110}  3.  *** Baseline:  Goal  status: {GOALSTATUS:25110}  4.  *** Baseline:  Goal status: {GOALSTATUS:25110}  5.  *** Baseline:  Goal status: {GOALSTATUS:25110}  6.  *** Baseline:  Goal status: {GOALSTATUS:25110}  LONG TERM GOALS: Target date: ***  *** Baseline:  Goal status: {GOALSTATUS:25110}  2.  *** Baseline:  Goal status: {GOALSTATUS:25110}  3.  *** Baseline:  Goal status: {GOALSTATUS:25110}  4.  *** Baseline:  Goal status: {GOALSTATUS:25110}  5.  *** Baseline:  Goal status: {GOALSTATUS:25110}  6.  *** Baseline:  Goal status: {GOALSTATUS:25110}  PLAN:  PT FREQUENCY: {rehab frequency:25116}  PT DURATION: {rehab duration:25117}  PLANNED INTERVENTIONS: {rehab planned interventions:25118::"Therapeutic exercises","Therapeutic activity","Neuromuscular re-education","Balance training","Gait training","Patient/Family education","Self Care","Joint mobilization"}.  PLAN FOR NEXT SESSION: ***   Aundreya Souffrant, PT 12/20/2022, 2:24 PM

## 2022-12-21 ENCOUNTER — Ambulatory Visit: Payer: Medicare HMO | Attending: Neurological Surgery | Admitting: Physical Therapy

## 2022-12-23 ENCOUNTER — Encounter: Payer: Medicare HMO | Admitting: Physical Therapy

## 2023-01-04 DIAGNOSIS — M48062 Spinal stenosis, lumbar region with neurogenic claudication: Secondary | ICD-10-CM | POA: Diagnosis not present

## 2023-02-17 ENCOUNTER — Telehealth: Payer: Self-pay | Admitting: Family Medicine

## 2023-02-17 NOTE — Telephone Encounter (Signed)
Contacted Russell Garcia to schedule their annual wellness visit. Appointment made for 04/05/2023.  Cira Servant Patient Engineer, production II Direct Dial: [phone]

## 2023-03-24 DIAGNOSIS — M48062 Spinal stenosis, lumbar region with neurogenic claudication: Secondary | ICD-10-CM | POA: Diagnosis not present

## 2023-04-05 ENCOUNTER — Ambulatory Visit (INDEPENDENT_AMBULATORY_CARE_PROVIDER_SITE_OTHER): Payer: Medicare HMO | Admitting: Family Medicine

## 2023-04-05 ENCOUNTER — Ambulatory Visit (HOSPITAL_BASED_OUTPATIENT_CLINIC_OR_DEPARTMENT_OTHER): Payer: Medicare HMO

## 2023-04-05 ENCOUNTER — Ambulatory Visit (INDEPENDENT_AMBULATORY_CARE_PROVIDER_SITE_OTHER): Payer: Medicare HMO | Admitting: Student

## 2023-04-05 VITALS — BP 168/103 | HR 82 | Ht 72.0 in | Wt 300.1 lb

## 2023-04-05 DIAGNOSIS — Z96641 Presence of right artificial hip joint: Secondary | ICD-10-CM

## 2023-04-05 DIAGNOSIS — M25551 Pain in right hip: Secondary | ICD-10-CM | POA: Diagnosis not present

## 2023-04-05 DIAGNOSIS — Z0000 Encounter for general adult medical examination without abnormal findings: Secondary | ICD-10-CM

## 2023-04-05 NOTE — Progress Notes (Signed)
Chief Complaint: Right hip discomfort     History of Present Illness:    Russell Garcia is a 67 y.o. male with history of right total hip arthroplasty 4 years ago presents today for evaluation of right hip pain.  Patient states that this began about 4 days ago when he went to see a movie in 4D.  The seats move and reportedly shook him around for two hours.  He has since had pain located in the anterior lateral right hip.  Pain levels are about a 4 out of 10 at rest and increased to 6 out of 10 with activity.  He states that he has not been able to work out since the injury.  He has been taking 5 mg oxycodones once a day for pain as prescribed by his neurosurgeon, however he just ran out.   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/2019   Procedure: RIGHT TOTAL HIP ARTHROPLASTY ANTERIOR APPROACH;  Surgeon: Kathryne Hitch, MD;  Location: [facility];  Service: Orthopedics;  Laterality: Right;   Social History   Socioeconomic History   Marital status: Widowed    Spouse name: Not on file   Number of children: 4   Years of education: 14   Highest education level: Some college, no degree  Occupational History   Occupation: Education administrator   Occupation: Retired  Tobacco Use   Smoking status: Never   Smokeless tobacco: Never  Building services engineer Use: Never used  Substance and Sexual Activity   Alcohol use: No    Alcohol/week: 0.0 standard drinks of alcohol   Drug  use: No   Sexual activity: Yes    Partners: Female  Other Topics Concern   Not on file  Social History Narrative   Lives with significant other. He has four children. He enjoys watching movies and fishing.    Social Determinants of Health   Financial Resource Strain: High Risk (04/05/2023)   Overall Financial Resource Strain (CARDIA)    Difficulty of Paying Living Expenses: Very hard  Food Insecurity: No Food Insecurity (04/05/2023)   Hunger Vital Sign    Worried About Running Out of Food in the Last Year: Never true    Ran Out of Food in the Last Year: Never true  Transportation Needs: No Transportation Needs (04/05/2023)   PRAPARE - Administrator, Civil Service (Medical): No    Lack of Transportation (Non-Medical): No  Physical Activity: Sufficiently Active (04/05/2023)   Exercise Vital Sign    Days of Exercise per Week: 3 days    Minutes of Exercise per Session: 60 min  Stress: No Stress Concern Present (04/05/2023)   [employer] of Occupational Health - Occupational Stress Questionnaire  Feeling of Stress : Not at all  Social Connections: Moderately Integrated (04/05/2023)   Social Connection and Isolation Panel [NHANES]    Frequency of Communication with Friends and Family: More than three times a week    Frequency of Social Gatherings with Friends and Family: Once a week    Attends Religious Services: More than 4 times per year    Active Member of [employer] or Organizations: No    Attends Engineer, structural: Never    Marital Status: Living with partner   Family History  Problem Relation Age of Onset   Leukemia Father    Heart attack Mother 48   Diabetes Neg Hx    Colon cancer Neg Hx    Prostate cancer Neg Hx    No Known Allergies Current Outpatient Medications  Medication Sig Dispense Refill   amLODipine (NORVASC) 10 MG tablet Take 1 tablet (10 mg total) by mouth daily. 90 tablet 2   aspirin EC 325 MG tablet Take 325 mg by mouth daily.      atorvastatin (LIPITOR) 20 MG tablet Take 1 tablet (20 mg total) by mouth daily. 90 tablet 2   benzonatate (TESSALON) 200 MG capsule Take 1 capsule (200 mg total) by mouth 2 (two) times daily as needed for cough. (Patient not taking: Reported on 04/05/2023) 20 capsule 0   carvedilol (COREG) 12.5 MG tablet TAKE 1 TABLET (12.5 MG TOTAL) BY MOUTH 2 (TWO) TIMES DAILY WITH A MEAL. (Patient taking differently: Take 12.5 mg by mouth 2 (two) times daily with a meal. Takes it once daily) 180 tablet 3   cyclobenzaprine (FLEXERIL) 10 MG tablet ONE HALF TO ONE TAB AT BEDTIME, THEN INCREASE GRADUALLY TO ONE TAB 3 TIMES A DAY (Patient not taking: Reported on 04/05/2023) 30 tablet 0   diclofenac sodium (VOLTAREN) 1 % GEL Apply 2 g topically 4 (four) times daily as needed (pain). (Patient not taking: Reported on 10/05/2022) 100 g 2   lisinopril-hydrochlorothiazide (ZESTORETIC) 20-25 MG tablet TAKE 1 TABLET BY MOUTH EVERY DAY 90 tablet 3   magnesium gluconate (MAGONATE) 500 MG tablet Take 500 mg by mouth 3 (three) times a week.     spironolactone (ALDACTONE) 25 MG tablet TAKE 1 TABLET (25 MG TOTAL) BY MOUTH DAILY. 90 tablet 3   tadalafil (CIALIS) 20 MG tablet Take 0.5-1 tablets (10-20 mg total) by mouth every other day as needed for erectile dysfunction. (Patient not taking: Reported on 04/05/2023) 10 tablet 3   No current facility-administered medications for this visit.   No results found.  Review of Systems:   A ROS was performed including pertinent positives and negatives as documented in the HPI.  Physical Exam :   Constitutional: NAD and appears stated age Neurological: Alert and oriented Psych: Appropriate affect and cooperative There were no vitals taken for this visit.   Comprehensive Musculoskeletal Exam:    Passive right hip range of motion to 120 degrees flexion, 30 degrees external rotation, and 20 degrees internal rotation.  Patient notes discomfort with terminal flexion and IR.  Tenderness to  palpation over the anterolateral hip.  Imaging:   Xray (AP pelvis and right hip): No evidence of fracture or dislocation.  Right hip THA components are well-appearing and unchanged from previous x-ray.   I personally reviewed and interpreted the radiographs.   Assessment:   67 y.o. male with 4 days of right hip pain.  His total hip arthroplasty components are well-appearing on x-ray without any evidence of loosening or damage.  I do suspect this is more likely a muscular cause of pain due to his mechanism of injury.  Patient wanted to be safe and ensure nothing happened to his hardware.  He is planning to follow-up with his neurosurgery office to discuss refill of his pain medications.  Discussed that myself or Dr. Magnus Ivan can see him for reassessment should symptoms not continue to improve or significantly worsen at any point.  Plan :    -Follow-up in clinic as needed     I personally saw and evaluated the patient, and participated in the management and treatment plan.  Hazle Nordmann, PA-C Orthopedics  This document was dictated using Conservation officer, historic buildings. A reasonable attempt at proof reading has been made to minimize errors.

## 2023-04-05 NOTE — Patient Instructions (Addendum)
MEDICARE ANNUAL WELLNESS VISIT Health Maintenance Summary and Written Plan of Care  Russell Garcia ,  Thank you for allowing me to perform your Medicare Annual Wellness Visit and for your ongoing commitment to your health.   Health Maintenance & Immunization History Health Maintenance  Topic Date Due   Hepatitis C Screening  04/13/2023 (Originally 08/28/1974)   COVID-19 Vaccine (3 - 2023-24 season) 04/21/2023 (Originally 06/25/2022)   Zoster Vaccines- Shingrix (1 of 2) 07/06/2023 (Originally 08/28/2006)   Pneumonia Vaccine 1+ Years old (1 of 1 - PCV) 04/04/2024 (Originally 08/28/2021)   INFLUENZA VACCINE  05/26/2023   Medicare Annual Wellness (AWV)  04/04/2024   Colonoscopy  11/22/2024   DTaP/Tdap/Td (5 - Td or Tdap) 06/15/2026   HPV VACCINES  Aged Out   Immunization History  Administered Date(s) Administered   Fluad Quad(high Dose 65+) 10/05/2022   Influenza,inj,Quad PF,6+ Mos 06/15/2016, 08/06/2019   PFIZER(Purple Top)SARS-COV-2 Vaccination 08/25/2020, 09/15/2020   Td 02/09/2006   Tdap 10/26/2013, 06/15/2016   Tetanus 10/26/2013    These are the patient goals that we discussed:  Goals Addressed               This Visit's Progress     Patient Stated (pt-stated)        Patient stated that he would like to loose 50 lbs.         This is a list of Health Maintenance Items that are overdue or due now: Pneumococcal vaccine  Shingles vaccine    Orders/Referrals Placed Today: Orders Placed This Encounter  Procedures   AMB Referral to Community Care Coordinaton (ACO Patients)    Referral Priority:   Routine    Referral Type:   Consultation    Referral Reason:   Care Coordination    Number of Visits Requested:   1   (Contact our referral department at [phone] if you have not spoken with someone about your referral appointment within the next 5 days)    Follow-up Plan Follow-up with Everrett Coombe, DO as planned Schedule pneumonia vaccine (Prevnar 20) and shingles  vaccine (shingrix). Per Dr. Ashley Royalty, resume carvedilol 12.5 mcg twice a day instead of once a day. Schedule Nurse visit for blood pressure check in one week. Medicare wellness visit in one year.  AVS printed and given to the patient.      Health Maintenance, Male Adopting a healthy lifestyle and getting preventive care are important in promoting health and wellness. Ask your health care provider about: The right schedule for you to have regular tests and exams. Things you can do on your own to prevent diseases and keep yourself healthy. What should I know about diet, weight, and exercise? Eat a healthy diet  Eat a diet that includes plenty of vegetables, fruits, low-fat dairy products, and lean protein. Do not eat a lot of foods that are high in solid fats, added sugars, or sodium. Maintain a healthy weight Body mass index (BMI) is a measurement that can be used to identify possible weight problems. It estimates body fat based on height and weight. Your health care provider can help determine your BMI and help you achieve or maintain a healthy weight. Get regular exercise Get regular exercise. This is one of the most important things you can do for your health. Most adults should: Exercise for at least 150 minutes each week. The exercise should increase your heart rate and make you sweat (moderate-intensity exercise). Do strengthening exercises at least twice a week. This is in addition  to the moderate-intensity exercise. Spend less time sitting. Even light physical activity can be beneficial. Watch cholesterol and blood lipids Have your blood tested for lipids and cholesterol at 67 years of age, then have this test every 5 years. You may need to have your cholesterol levels checked more often if: Your lipid or cholesterol levels are high. You are older than 67 years of age. You are at high risk for heart disease. What should I know about cancer screening? Many types of cancers can be  detected early and may often be prevented. Depending on your health history and family history, you may need to have cancer screening at various ages. This may include screening for: Colorectal cancer. Prostate cancer. Skin cancer. Lung cancer. What should I know about heart disease, diabetes, and high blood pressure? Blood pressure and heart disease High blood pressure causes heart disease and increases the risk of stroke. This is more likely to develop in people who have high blood pressure readings or are overweight. Talk with your health care provider about your target blood pressure readings. Have your blood pressure checked: Every 3-5 years if you are 58-19 years of age. Every year if you are 19 years old or older. If you are between the ages of 9 and 62 and are a current or former smoker, ask your health care provider if you should have a one-time screening for abdominal aortic aneurysm (AAA). Diabetes Have regular diabetes screenings. This checks your fasting blood sugar level. Have the screening done: Once every three years after age 50 if you are at a normal weight and have a low risk for diabetes. More often and at a younger age if you are overweight or have a high risk for diabetes. What should I know about preventing infection? Hepatitis B If you have a higher risk for hepatitis B, you should be screened for this virus. Talk with your health care provider to find out if you are at risk for hepatitis B infection. Hepatitis C Blood testing is recommended for: Everyone born from [birth year] through 1965. Anyone with known risk factors for hepatitis C. Sexually transmitted infections (STIs) You should be screened each year for STIs, including gonorrhea and chlamydia, if: You are sexually active and are younger than 67 years of age. You are older than 67 years of age and your health care provider tells you that you are at risk for this type of infection. Your sexual activity has changed  since you were last screened, and you are at increased risk for chlamydia or gonorrhea. Ask your health care provider if you are at risk. Ask your health care provider about whether you are at high risk for HIV. Your health care provider may recommend a prescription medicine to help prevent HIV infection. If you choose to take medicine to prevent HIV, you should first get tested for HIV. You should then be tested every 3 months for as long as you are taking the medicine. Follow these instructions at home: Alcohol use Do not drink alcohol if your health care provider tells you not to drink. If you drink alcohol: Limit how much you have to 0-2 drinks a day. Know how much alcohol is in your drink. In the [place] one drink equals one 12 oz bottle of beer (355 mL), one 5 oz glass of wine (148 mL), or one 1 oz glass of hard liquor (44 mL). Lifestyle Do not use any products that contain nicotine or tobacco. These products include cigarettes, chewing  tobacco, and vaping devices, such as e-cigarettes. If you need help quitting, ask your health care provider. Do not use street drugs. Do not share needles. Ask your health care provider for help if you need support or information about quitting drugs. General instructions Schedule regular health, dental, and eye exams. Stay current with your vaccines. Tell your health care provider if: You often feel depressed. You have ever been abused or do not feel safe at home. Summary Adopting a healthy lifestyle and getting preventive care are important in promoting health and wellness. Follow your health care provider's instructions about healthy diet, exercising, and getting tested or screened for diseases. Follow your health care provider's instructions on monitoring your cholesterol and blood pressure. This information is not intended to replace advice given to you by your health care provider. Make sure you discuss any questions you have with your health care  provider. Document Revised: 03/02/2021 Document Reviewed: 03/02/2021 Elsevier Patient Education  2024 [employer].

## 2023-04-05 NOTE — Progress Notes (Signed)
MEDICARE ANNUAL WELLNESS VISIT  04/05/2023  Subjective:  Russell HUYSER is a 67 y.o. male patient of Everrett Coombe, DO who had a Medicare Annual Wellness Visit today. Abran is Retired and lives with an adult companion. he has 4 children. he reports that he is socially active and does interact with friends/family regularly. he is moderately physically active and enjoys watching movies and fishing.   Patient Care Team: Everrett Coombe, DO as PCP - General (Family Medicine) Gabriel Carina, [facility] as Pharmacist (Pharmacist)     04/05/2023    9:21 AM 08/23/2019    5:02 PM 06/19/2019    4:19 PM 06/08/2019   10:21 AM 06/05/2019   11:25 AM 04/19/2019   12:16 PM 07/05/2017    7:55 PM  Advanced Directives  Does Patient Have a Medical Advance Directive? No No No No No No No  Would patient like information on creating a medical advance directive? No - Patient declined No - Patient declined No - Patient declined No - Patient declined No - Patient declined No - Patient declined     Hospital Utilization Over the Past 12 Months: # of hospitalizations or ER visits: 0 # of surgeries: 1  Review of Systems    Patient reports that his overall health is better when compared to last year.  Review of Systems: History obtained from chart review and the patient  All other systems negative.  Pain Assessment Pain : 0-10 Pain Score: 4  Pain Type: Acute pain Pain Location: Hip Pain Orientation: Right Pain Descriptors / Indicators: Aching, Constant Pain Onset: In the past 7 days Pain Frequency: Intermittent Pain Relieving Factors: none  Pain Relieving Factors: none  Current Medications & Allergies (verified) Allergies as of 04/05/2023   No Known Allergies      Medication List        Accurate as of April 05, 2023  9:51 AM. If you have any questions, ask your nurse or doctor.          STOP taking these medications    predniSONE 50 MG tablet Commonly known as: DELTASONE       TAKE these  medications    amLODipine 10 MG tablet Commonly known as: NORVASC Take 1 tablet (10 mg total) by mouth daily.   aspirin EC 325 MG tablet Take 325 mg by mouth daily.   atorvastatin 20 MG tablet Commonly known as: LIPITOR Take 1 tablet (20 mg total) by mouth daily.   benzonatate 200 MG capsule Commonly known as: TESSALON Take 1 capsule (200 mg total) by mouth 2 (two) times daily as needed for cough.   carvedilol 12.5 MG tablet Commonly known as: COREG TAKE 1 TABLET (12.5 MG TOTAL) BY MOUTH 2 (TWO) TIMES DAILY WITH A MEAL. What changed: additional instructions   cyclobenzaprine 10 MG tablet Commonly known as: FLEXERIL ONE HALF TO ONE TAB AT BEDTIME, THEN INCREASE GRADUALLY TO ONE TAB 3 TIMES A DAY   diclofenac sodium 1 % Gel Commonly known as: VOLTAREN Apply 2 g topically 4 (four) times daily as needed (pain).   lisinopril-hydrochlorothiazide 20-25 MG tablet Commonly known as: ZESTORETIC TAKE 1 TABLET BY MOUTH EVERY DAY   magnesium gluconate 500 MG tablet Commonly known as: MAGONATE Take 500 mg by mouth 3 (three) times a week.   spironolactone 25 MG tablet Commonly known as: ALDACTONE TAKE 1 TABLET (25 MG TOTAL) BY MOUTH DAILY.   tadalafil 20 MG tablet Commonly known as: CIALIS Take 0.5-1 tablets (10-20 mg total) by  mouth every other day as needed for erectile dysfunction.        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/2019   Procedure: RIGHT TOTAL HIP ARTHROPLASTY ANTERIOR APPROACH;  Surgeon: Kathryne Hitch, MD;  Location: [facility] OR;  Service: Orthopedics;  Laterality: Right;   Family History   Problem Relation Age of Onset   Leukemia Father    Heart attack Mother 56   Diabetes Neg Hx    Colon cancer Neg Hx    Prostate cancer Neg Hx    Social History   Socioeconomic History   Marital status: Widowed    Spouse name: Not on file   Number of children: 4   Years of education: 14   Highest education level: Some college, no degree  Occupational History   Occupation: Education administrator   Occupation: Retired  Tobacco Use   Smoking status: Never   Smokeless tobacco: Never  Building services engineer Use: Never used  Substance and Sexual Activity   Alcohol use: No    Alcohol/week: 0.0 standard drinks of alcohol   Drug use: No   Sexual activity: Yes    Partners: Female  Other Topics Concern   Not on file  Social History Narrative   Lives with significant other. He has four children. He enjoys watching movies and fishing.    Social Determinants of Health   Financial Resource Strain: High Risk (04/05/2023)   Overall Financial Resource Strain (CARDIA)    Difficulty of Paying Living Expenses: Very hard  Food Insecurity: No Food Insecurity (04/05/2023)   Hunger Vital Sign    Worried About Running Out of Food in the Last Year: Never true    Ran Out of Food in the Last Year: Never true  Transportation Needs: No Transportation Needs (04/05/2023)   PRAPARE - Administrator, Civil Service (Medical): No    Lack of Transportation (Non-Medical): No  Physical Activity: Sufficiently Active (04/05/2023)   Exercise Vital Sign    Days of Exercise per Week: 3 days    Minutes of Exercise per Session: 60 min  Stress: No Stress Concern Present (04/05/2023)   [employer] of Occupational Health - Occupational Stress Questionnaire    Feeling of Stress : Not at all  Social Connections: Moderately Integrated (04/05/2023)   Social Connection and Isolation Panel [NHANES]    Frequency of Communication with Friends and Family: More than three times a week    Frequency of Social Gatherings with  Friends and Family: Once a week    Attends Religious Services: More than 4 times per year    Active Member of [employer] or Organizations: No    Attends Banker Meetings: Never    Marital Status: Living with partner    Activities of Daily Living    04/05/2023    9:25 AM  In your present state of health, do you have any difficulty performing the following activities:  Hearing? 0  Vision? 0  Difficulty concentrating or making decisions? 1  Comment  some memory loss  Walking or climbing stairs? 0  Dressing or bathing? 0  Doing errands, shopping? 0  Preparing Food and eating ? N  Using the Toilet? N  In the past six months, have you accidently leaked urine? N  Do you have problems with loss of bowel control? N  Managing your Medications? N  Managing your Finances? N  Housekeeping or managing your Housekeeping? N    Patient Education/Literacy How often do you need to have someone help you when you read instructions, pamphlets, or other written materials from your doctor or pharmacy?: 1 - Never What is the last grade level you completed in school?: Some college  Exercise Current Exercise Habits: Home exercise routine, Type of exercise: Other - see comments;walking (bicycle), Time (Minutes): 60, Frequency (Times/Week): 3, Weekly Exercise (Minutes/Week): 180, Intensity: Moderate, Exercise limited by: orthopedic condition(s)  Diet Patient reports consuming 2 meals a day and 0 snack(s) a day Patient reports that his primary diet is: Regular Patient reports that she does have regular access to food.   Depression Screen    04/05/2023    9:22 AM 10/05/2022    9:47 AM 04/12/2022    3:48 PM  PHQ 2/9 Scores  PHQ - 2 Score 0 0 0     Fall Risk    04/05/2023    9:21 AM 10/05/2022    9:47 AM 04/12/2022    3:48 PM 05/04/2019    6:25 AM  Fall Risk   Falls in the past year? 0 1 0   Number falls in past yr: 0 0 0   Injury with Fall? 0 0 0   Risk for fall due to : Impaired  balance/gait No Fall Risks No Fall Risks Impaired mobility  Follow up Falls evaluation completed Falls evaluation completed Falls evaluation completed Education provided     Objective:   BP (!) 168/103 (BP Location: Left Arm, Patient Position: Sitting, Cuff Size: Large)   Pulse 82   Ht 6' (1.829 m)   Wt (!) 300 lb 1.9 oz (136.1 kg)   SpO2 100%   BMI 40.70 kg/m   Last Weight  Most recent update: 04/05/2023  9:10 AM    Weight  136.1 kg (300 lb 1.9 oz)               Body mass index is 40.7 kg/m.  Hearing/Vision  Gahel did not have difficulty with hearing/understanding during the face-to-face interview Damareon did not have difficulty with his vision during the face-to-face interview Reports that he has had a formal eye exam by an eye care professional within the past year Reports that he has not had a formal hearing evaluation within the past year  Cognitive Function:    04/05/2023    9:30 AM  6CIT Screen  What Year? 0 points  What month? 0 points  What time? 0 points  Count back from 20 2 points  Months in reverse 0 points  Repeat phrase 4 points  Total Score 6 points    Normal Cognitive Function Screening: Yes (Normal:0-7, Significant for Dysfunction: >8)  Immunization & Health Maintenance Record Immunization History  Administered Date(s) Administered   Fluad Quad(high Dose 65+) 10/05/2022   Influenza,inj,Quad PF,6+ Mos 06/15/2016, 08/06/2019   PFIZER(Purple Top)SARS-COV-2 Vaccination 08/25/2020, 09/15/2020   Td 02/09/2006   Tdap 10/26/2013, 06/15/2016   Tetanus 10/26/2013    Health Maintenance  Topic Date Due   Hepatitis C Screening  04/13/2023 (Originally 08/28/1974)   COVID-19 Vaccine (3 -  2023-24 season) 04/21/2023 (Originally 06/25/2022)   Zoster Vaccines- Shingrix (1 of 2) 07/06/2023 (Originally 08/28/2006)   Pneumonia Vaccine 78+ Years old (1 of 1 - PCV) 04/04/2024 (Originally 08/28/2021)   INFLUENZA VACCINE  05/26/2023   Medicare Annual Wellness (AWV)   04/04/2024   Colonoscopy  11/22/2024   DTaP/Tdap/Td (5 - Td or Tdap) 06/15/2026   HPV VACCINES  Aged Out       Assessment  This is a routine wellness examination for [employer].  Health Maintenance: Due or Overdue There are no preventive care reminders to display for this patient.   Demetrius Revel [place] does not need a referral for [employer] Assistance: Care Management:   no Social Work:    no Prescription Assistance:  no Nutrition/Diabetes Education:  no   Plan:  Personalized Goals  Goals Addressed               This Visit's Progress     Patient Stated (pt-stated)        Patient stated that he would like to loose 50 lbs.       Personalized Health Maintenance & Screening Recommendations  Pneumococcal vaccine  Shingles vaccine  Lung Cancer Screening Recommended: no (Low Dose CT Chest recommended if Age 62-80 years, 20 pack-year currently smoking OR have quit w/in past 15 years) Hepatitis C Screening recommended: no HIV Screening recommended: no  Advanced Directives: Written information was not given per the patient's request.  Referrals & Orders Orders Placed This Encounter  Procedures   AMB Referral to Community Care Coordinaton (ACO Patients)    Follow-up Plan Follow-up with Everrett Coombe, DO as planned Schedule pneumonia vaccine (Prevnar 20) and shingles vaccine (shingrix). Per Dr. Ashley Royalty, resume carvedilol 12.5 mcg twice a day instead of once a day. Schedule Nurse visit for blood pressure check in one week. Medicare wellness visit in one year.  AVS printed and given to the patient.   I have personally reviewed and noted the following in the patient's chart:   Medical and social history Use of alcohol, tobacco or illicit drugs  Current medications and supplements Functional ability and status Nutritional status Physical activity Advanced directives List of other physicians Hospitalizations, surgeries, and ER visits in previous 12  months Vitals Screenings to include cognitive, depression, and falls Referrals and appointments  In addition, I have reviewed and discussed with patient certain preventive protocols, quality metrics, and best practice recommendations. A written personalized care plan for preventive services as well as general preventive health recommendations were provided to patient.     Modesto Charon, RN BSN  04/05/2023

## 2023-04-07 ENCOUNTER — Telehealth: Payer: Self-pay | Admitting: *Deleted

## 2023-04-07 NOTE — Telephone Encounter (Signed)
   Telephone encounter was:  Unsuccessful.  04/07/2023 Name: Russell Garcia MRN: [id number] DOB: 17-Jun-1956  Unsuccessful outbound call made today to assist with:  Food Insecurity  Outreach Attempt:  1st Attempt  A HIPAA compliant voice message was left requesting a return call.  Instructed patient to call back at [phone].  Yehuda Mao Greenauer -[facility] [place], Population Health [phone] [address] [place] , [place] [address] Email : [email or web address]

## 2023-04-08 ENCOUNTER — Telehealth: Payer: Self-pay | Admitting: *Deleted

## 2023-04-08 NOTE — Telephone Encounter (Signed)
   Telephone encounter was:  Unsuccessful.  04/08/2023 Name: MITHIL HIRABAYASHI MRN: [id number] DOB: 1956-04-18  Unsuccessful outbound call made today to assist with:  Food Insecurity  Outreach Attempt:  2nd Attempt  A HIPAA compliant voice message was left requesting a return call.  Instructed patient to call back at [phone] Alois Cliche -[facility] [place], Population Health [phone] [address] [place] , [place] [address] Email : [email or web address]

## 2023-04-11 ENCOUNTER — Telehealth: Payer: Self-pay | Admitting: *Deleted

## 2023-04-11 NOTE — Telephone Encounter (Signed)
   Telephone encounter was:  Successful.  04/11/2023 Name: AMARIAN TIBOR MRN: [id number] DOB: June 30, 1956  STEPHON DERNBACH is a 67 y.o. year old male who is a primary care patient of Everrett Coombe, DO . The community resource team was consulted for assistance with  Utilities ,   Care guide performed the following interventions: Patient provided with information about care guide support team and interviewed to confirm resource needs. Provided patient with options that care guide is aware of in his community not a lot with past due rent and utilities  Follow Up Plan:  No further follow up planned at this time. The patient has been provided with needed resources. Yehuda Mao Greenauer -[facility] [place], Population Health [phone] [address] [place] , [place] [address] Email : [email or web address]

## 2023-04-12 ENCOUNTER — Ambulatory Visit (INDEPENDENT_AMBULATORY_CARE_PROVIDER_SITE_OTHER): Payer: Medicare HMO | Admitting: Family Medicine

## 2023-04-12 VITALS — BP 138/90 | HR 79 | Ht 72.0 in

## 2023-04-12 DIAGNOSIS — I10 Essential (primary) hypertension: Secondary | ICD-10-CM | POA: Diagnosis not present

## 2023-04-12 NOTE — Progress Notes (Signed)
Medical screening examination/treatment was performed by qualified clinical staff member and as supervising physician I was immediately available for consultation/collaboration. I have reviewed documentation and agree with assessment and plan.  Wylene Weissman, DO  

## 2023-04-12 NOTE — Progress Notes (Signed)
   Established Patient Office Visit  Subjective   Patient ID: Russell Garcia, male    DOB: 03/27/1956  Age: 67 y.o. MRN: [id number]  Chief Complaint  Patient presents with   Hypertension    BP check nurse visit.     HPI  Hypertension BP check. Patient denies chest pain, shortness of breath, dizziness, palpitations or problems with medication or with recnent medication increase..  Patient states he has been taking tylenol 500mg   BID daily for a recent injury.   ROS    Objective:     BP (!) 138/90   Pulse 79   Ht 6' (1.829 m)   SpO2 98%   BMI 40.70 kg/m    Physical Exam   No results found for any visits on 04/12/23.    The ASCVD Risk score (Arnett DK, et al., 2019) failed to calculate for the following reasons:   Cannot find a previous HDL lab   Cannot find a previous total cholesterol lab    Assessment & Plan:  BP check nurse visit. Initial reading = 145/92 second reading = 138/90. Per Dr. Ashley Royalty continue on current medication regimen and schedule a visit with him within the next month.  Problem List Items Addressed This Visit       Cardiovascular and Mediastinum   HTN (hypertension) - Primary    No follow-ups on file.    Elizabeth Palau, LPN

## 2023-05-11 ENCOUNTER — Telehealth: Payer: Self-pay | Admitting: Family Medicine

## 2023-05-11 DIAGNOSIS — I10 Essential (primary) hypertension: Secondary | ICD-10-CM

## 2023-05-11 MED ORDER — LISINOPRIL-HYDROCHLOROTHIAZIDE 20-25 MG PO TABS
1.0000 | ORAL_TABLET | Freq: Every day | ORAL | 1 refills | Status: DC
Start: 2023-05-11 — End: 2023-05-30

## 2023-05-11 MED ORDER — SPIRONOLACTONE 25 MG PO TABS
25.0000 mg | ORAL_TABLET | Freq: Every day | ORAL | 1 refills | Status: DC
Start: 2023-05-11 — End: 2023-08-03

## 2023-05-11 MED ORDER — CARVEDILOL 12.5 MG PO TABS: 12.5000 mg | ORAL_TABLET | Freq: Two times a day (BID) | ORAL | 1 refills | Status: DC

## 2023-05-11 MED ORDER — ATORVASTATIN CALCIUM 20 MG PO TABS: 20.0000 mg | ORAL_TABLET | Freq: Every day | ORAL | 3 refills | Status: DC

## 2023-05-11 NOTE — Telephone Encounter (Signed)
Meds sent to pharmacy.

## 2023-05-11 NOTE — Telephone Encounter (Signed)
Patient called he is requesting refills on all medications  Pharmacy is [facility] on [address] [place] phone [phone]

## 2023-05-12 ENCOUNTER — Ambulatory Visit: Payer: Medicare HMO

## 2023-05-23 ENCOUNTER — Ambulatory Visit (INDEPENDENT_AMBULATORY_CARE_PROVIDER_SITE_OTHER): Payer: Medicare HMO | Admitting: Orthopedic Surgery

## 2023-05-23 DIAGNOSIS — Z89511 Acquired absence of right leg below knee: Secondary | ICD-10-CM | POA: Diagnosis not present

## 2023-05-30 ENCOUNTER — Encounter: Payer: Self-pay | Admitting: Family Medicine

## 2023-05-30 ENCOUNTER — Ambulatory Visit (INDEPENDENT_AMBULATORY_CARE_PROVIDER_SITE_OTHER): Payer: Medicare HMO | Admitting: Family Medicine

## 2023-05-30 VITALS — BP 138/90 | HR 71 | Ht 72.0 in | Wt 300.0 lb

## 2023-05-30 DIAGNOSIS — H539 Unspecified visual disturbance: Secondary | ICD-10-CM

## 2023-05-30 DIAGNOSIS — I10 Essential (primary) hypertension: Secondary | ICD-10-CM | POA: Diagnosis not present

## 2023-05-30 MED ORDER — VALSARTAN-HYDROCHLOROTHIAZIDE 160-25 MG PO TABS: 1.0000 | ORAL_TABLET | Freq: Every day | ORAL | 3 refills | Status: DC

## 2023-05-30 NOTE — Assessment & Plan Note (Signed)
Blood pressure remains elevated.  Discontinue lisinopril with hydrochlorothiazide.  Start valsartan with hydrochlorothiazide.  Continue amlodipine and carvedilol at current strength.

## 2023-05-30 NOTE — Patient Instructions (Addendum)
STOP lisinopril/hydrochlorothiazide  Start Valsartan/hydrochlorothiazide  Add benefiber or miralax for constipation.  Be sure to drink plenty of fluid.   Colonoscopy due in 2026  Return in 2-3 weeks for nurse visit.

## 2023-05-30 NOTE — Progress Notes (Signed)
Russell Garcia - 67 y.o. male MRN [id number]  Date of birth: [birth year]  Subjective Chief Complaint  Patient presents with   Hypertension    HPI Russell Garcia is a 67 year old male here today for follow-up visit.  He reports he is feeling pretty well.  His blood pressure has been elevated over the past couple of visits.  Medications were adjusted however these remain elevated.  He does continue on amlodipine, lisinopril hydrochlorothiazide and carvedilol.  He does have some vision changes over the past several months.  He has not had chest pain, shortness of breath, palpitations, headaches.  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/2019   Procedure: RIGHT TOTAL HIP ARTHROPLASTY ANTERIOR APPROACH;  Surgeon: Kathryne Hitch, MD;  Location: [facility] OR;  Service: Orthopedics;  Laterality: Right;    Social History   Socioeconomic History   Marital status: Widowed    Spouse name: Not on file   Number of children: 4   Years of education: 14   Highest education level: Some college, no degree  Occupational History   Occupation: Education administrator   Occupation: Retired  Tobacco Use   Smoking status: Never   Smokeless tobacco: Never  Advertising account planner   Vaping status: Never Used  Substance and Sexual Activity   Alcohol use: No    Alcohol/week: 0.0 standard drinks of alcohol   Drug use: No   Sexual activity: Yes    Partners: Female  Other Topics Concern   Not on file  Social History Narrative   Lives with significant other. He has four children. He enjoys watching  movies and fishing.    Social Determinants of Health   Financial Resource Strain: High Risk (04/05/2023)   Overall Financial Resource Strain (CARDIA)    Difficulty of Paying Living Expenses: Very hard  Food Insecurity: No Food Insecurity (04/05/2023)   Hunger Vital Sign    Worried About Running Out of Food in the Last Year: Never true    Ran Out of Food in the Last Year: Never true  Transportation Needs: No Transportation Needs (04/05/2023)   PRAPARE - Administrator, Civil Service (Medical): No    Lack of Transportation (Non-Medical): No  Physical Activity: Sufficiently Active (04/05/2023)   Exercise Vital Sign    Days of Exercise per Week: 3 days    Minutes of Exercise per Session: 60 min  Stress: No Stress Concern Present (04/05/2023)   [employer] of Occupational Health - Occupational Stress Questionnaire    Feeling of Stress : Not at all  Social Connections: Moderately Integrated (04/05/2023)   Social Connection and Isolation Panel [NHANES]    Frequency of Communication with Friends and Family: More than three times a week    Frequency of Social Gatherings with Friends and Family: Once a week    Attends Religious Services: More than 4 times per year    Active Member of Clubs or Organizations: No  Attends Banker Meetings: Never    Marital Status: Living with partner    Family History  Problem Relation Age of Onset   Leukemia Father    Heart attack Mother 71   Diabetes Neg Hx    Colon cancer Neg Hx    Prostate cancer Neg Hx     Health Maintenance  Topic Date Due   Hepatitis C Screening  Never done   COVID-19 Vaccine (3 - 2023-24 season) 06/25/2022   INFLUENZA VACCINE  05/26/2023   Zoster Vaccines- Shingrix (1 of 2) 07/06/2023 (Originally 08/28/2006)   Pneumonia Vaccine 51+ Years old (1 of 1 - PCV) 04/04/2024 (Originally 08/28/2021)   Medicare Annual Wellness (AWV)  04/04/2024   Colonoscopy  11/22/2024   DTaP/Tdap/Td (5 - Td or Tdap)  06/15/2026   HPV VACCINES  Aged Out     ----------------------------------------------------------------------------------------------------------------------------------------------------------------------------------------------------------------- Physical Exam BP (!) 138/90 (BP Location: Left Arm, Patient Position: Sitting, Cuff Size: Large)   Pulse 71   Ht 6' (1.829 m)   Wt 300 lb (136.1 kg)   SpO2 98%   BMI 40.69 kg/m   Physical Exam Constitutional:      Appearance: Normal appearance.  HENT:     Head: Normocephalic and atraumatic.  Cardiovascular:     Rate and Rhythm: Normal rate and regular rhythm.  Pulmonary:     Effort: Pulmonary effort is normal.     Breath sounds: Normal breath sounds.  Neurological:     Mental Status: He is alert.  Psychiatric:        Mood and Affect: Mood normal.        Behavior: Behavior normal.     ------------------------------------------------------------------------------------------------------------------------------------------------------------------------------------------------------------------- Assessment and Plan  HTN (hypertension) Blood pressure remains elevated.  Discontinue lisinopril with hydrochlorothiazide.  Start valsartan with hydrochlorothiazide.  Continue amlodipine and carvedilol at current strength.   Meds ordered this encounter  Medications   valsartan-hydrochlorothiazide (DIOVAN-HCT) 160-25 MG tablet    Sig: Take 1 tablet by mouth daily.    Dispense:  90 tablet    Refill:  3    Return in about 3 weeks (around 06/20/2023) for Nurse visit-BP check.    This visit occurred during the SARS-CoV-2 public health emergency.  Safety protocols were in place, including screening questions prior to the visit, additional usage of staff PPE, and extensive cleaning of exam room while observing appropriate contact time as indicated for disinfecting solutions.

## 2023-06-07 ENCOUNTER — Encounter: Payer: Self-pay | Admitting: Orthopedic Surgery
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
## 2023-06-08 ENCOUNTER — Inpatient Hospital Stay (HOSPITAL_COMMUNITY): Payer: Medicare HMO | Admitting: Anesthesiology

## 2023-06-08 ENCOUNTER — Other Ambulatory Visit: Payer: Self-pay

## 2023-06-08 ENCOUNTER — Encounter (HOSPITAL_COMMUNITY): Payer: Self-pay

## 2023-06-08 ENCOUNTER — Inpatient Hospital Stay (HOSPITAL_COMMUNITY)
Admission: EM | Admit: 2023-06-08 | Discharge: 2023-07-22 | DRG: 003 | Disposition: A | Discharge status: Rehabilitation Facility | Payer: Medicare HMO

## 2023-06-08 ENCOUNTER — Emergency Department (HOSPITAL_COMMUNITY): Payer: Medicare HMO | Admitting: Anesthesiology

## 2023-06-08 ENCOUNTER — Encounter (HOSPITAL_COMMUNITY): Admission: EM | Disposition: A | Discharge status: Rehabilitation Facility | Payer: Self-pay | Source: Home / Self Care

## 2023-06-08 ENCOUNTER — Emergency Department (HOSPITAL_COMMUNITY): Payer: Medicare HMO

## 2023-06-08 ENCOUNTER — Inpatient Hospital Stay (HOSPITAL_COMMUNITY): Payer: Medicare HMO

## 2023-06-08 DIAGNOSIS — I878 Other specified disorders of veins: Secondary | ICD-10-CM | POA: Diagnosis not present

## 2023-06-08 DIAGNOSIS — S36599A Other injury of unspecified part of colon, initial encounter: Secondary | ICD-10-CM

## 2023-06-08 DIAGNOSIS — S45191A Other specified injury of brachial artery, right side, initial encounter: Secondary | ICD-10-CM | POA: Diagnosis not present

## 2023-06-08 DIAGNOSIS — W3400XA Accidental discharge from unspecified firearms or gun, initial encounter: Principal | ICD-10-CM

## 2023-06-08 DIAGNOSIS — K6389 Other specified diseases of intestine: Secondary | ICD-10-CM | POA: Diagnosis not present

## 2023-06-08 DIAGNOSIS — Z4682 Encounter for fitting and adjustment of non-vascular catheter: Secondary | ICD-10-CM | POA: Diagnosis not present

## 2023-06-08 DIAGNOSIS — K922 Gastrointestinal hemorrhage, unspecified: Secondary | ICD-10-CM | POA: Diagnosis not present

## 2023-06-08 DIAGNOSIS — S21101A Unspecified open wound of right front wall of thorax without penetration into thoracic cavity, initial encounter: Secondary | ICD-10-CM | POA: Diagnosis not present

## 2023-06-08 DIAGNOSIS — Z7982 Long term (current) use of aspirin: Secondary | ICD-10-CM

## 2023-06-08 DIAGNOSIS — R000 Tachycardia, unspecified: Secondary | ICD-10-CM | POA: Diagnosis not present

## 2023-06-08 DIAGNOSIS — K567 Ileus, unspecified: Secondary | ICD-10-CM | POA: Diagnosis not present

## 2023-06-08 DIAGNOSIS — J90 Pleural effusion, not elsewhere classified: Secondary | ICD-10-CM | POA: Diagnosis not present

## 2023-06-08 DIAGNOSIS — Z452 Encounter for adjustment and management of vascular access device: Secondary | ICD-10-CM | POA: Diagnosis not present

## 2023-06-08 DIAGNOSIS — S36523A Contusion of sigmoid colon, initial encounter: Secondary | ICD-10-CM | POA: Diagnosis present

## 2023-06-08 DIAGNOSIS — R58 Hemorrhage, not elsewhere classified: Secondary | ICD-10-CM | POA: Diagnosis not present

## 2023-06-08 DIAGNOSIS — E785 Hyperlipidemia, unspecified: Secondary | ICD-10-CM | POA: Diagnosis not present

## 2023-06-08 DIAGNOSIS — R578 Other shock: Secondary | ICD-10-CM | POA: Diagnosis present

## 2023-06-08 DIAGNOSIS — Z9911 Dependence on respirator [ventilator] status: Secondary | ICD-10-CM

## 2023-06-08 DIAGNOSIS — D689 Coagulation defect, unspecified: Secondary | ICD-10-CM | POA: Diagnosis present

## 2023-06-08 DIAGNOSIS — Z96641 Presence of right artificial hip joint: Secondary | ICD-10-CM | POA: Diagnosis not present

## 2023-06-08 DIAGNOSIS — S41131A Puncture wound without foreign body of right upper arm, initial encounter: Secondary | ICD-10-CM | POA: Diagnosis present

## 2023-06-08 DIAGNOSIS — J690 Pneumonitis due to inhalation of food and vomit: Secondary | ICD-10-CM | POA: Diagnosis not present

## 2023-06-08 DIAGNOSIS — S36511A Primary blast injury of transverse colon, initial encounter: Secondary | ICD-10-CM | POA: Diagnosis not present

## 2023-06-08 DIAGNOSIS — J9601 Acute respiratory failure with hypoxia: Secondary | ICD-10-CM | POA: Diagnosis not present

## 2023-06-08 DIAGNOSIS — D62 Acute posthemorrhagic anemia: Secondary | ICD-10-CM | POA: Diagnosis not present

## 2023-06-08 DIAGNOSIS — J939 Pneumothorax, unspecified: Secondary | ICD-10-CM | POA: Diagnosis not present

## 2023-06-08 DIAGNOSIS — S36113A Laceration of liver, unspecified degree, initial encounter: Secondary | ICD-10-CM | POA: Diagnosis present

## 2023-06-08 DIAGNOSIS — S270XXA Traumatic pneumothorax, initial encounter: Secondary | ICD-10-CM | POA: Diagnosis present

## 2023-06-08 DIAGNOSIS — R61 Generalized hyperhidrosis: Secondary | ICD-10-CM | POA: Diagnosis present

## 2023-06-08 DIAGNOSIS — R1312 Dysphagia, oropharyngeal phase: Secondary | ICD-10-CM | POA: Diagnosis not present

## 2023-06-08 DIAGNOSIS — J811 Chronic pulmonary edema: Secondary | ICD-10-CM | POA: Diagnosis not present

## 2023-06-08 DIAGNOSIS — R1110 Vomiting, unspecified: Secondary | ICD-10-CM | POA: Diagnosis not present

## 2023-06-08 DIAGNOSIS — K9429 Other complications of gastrostomy: Secondary | ICD-10-CM | POA: Diagnosis not present

## 2023-06-08 DIAGNOSIS — E8770 Fluid overload, unspecified: Secondary | ICD-10-CM | POA: Diagnosis not present

## 2023-06-08 DIAGNOSIS — J984 Other disorders of lung: Secondary | ICD-10-CM | POA: Diagnosis not present

## 2023-06-08 DIAGNOSIS — S3991XA Unspecified injury of abdomen, initial encounter: Secondary | ICD-10-CM | POA: Diagnosis not present

## 2023-06-08 DIAGNOSIS — J9809 Other diseases of bronchus, not elsewhere classified: Secondary | ICD-10-CM | POA: Diagnosis not present

## 2023-06-08 DIAGNOSIS — Z89511 Acquired absence of right leg below knee: Secondary | ICD-10-CM

## 2023-06-08 DIAGNOSIS — F430 Acute stress reaction: Secondary | ICD-10-CM | POA: Diagnosis not present

## 2023-06-08 DIAGNOSIS — R404 Transient alteration of awareness: Secondary | ICD-10-CM | POA: Diagnosis not present

## 2023-06-08 DIAGNOSIS — S5411XD Injury of median nerve at forearm level, right arm, subsequent encounter: Secondary | ICD-10-CM | POA: Diagnosis not present

## 2023-06-08 DIAGNOSIS — S36502A Unspecified injury of descending [left] colon, initial encounter: Secondary | ICD-10-CM | POA: Diagnosis not present

## 2023-06-08 DIAGNOSIS — E872 Acidosis, unspecified: Secondary | ICD-10-CM | POA: Diagnosis present

## 2023-06-08 DIAGNOSIS — N179 Acute kidney failure, unspecified: Secondary | ICD-10-CM | POA: Diagnosis present

## 2023-06-08 DIAGNOSIS — J9621 Acute and chronic respiratory failure with hypoxia: Secondary | ICD-10-CM | POA: Diagnosis not present

## 2023-06-08 DIAGNOSIS — R918 Other nonspecific abnormal finding of lung field: Secondary | ICD-10-CM | POA: Diagnosis not present

## 2023-06-08 DIAGNOSIS — E861 Hypovolemia: Secondary | ICD-10-CM | POA: Diagnosis present

## 2023-06-08 DIAGNOSIS — I10 Essential (primary) hypertension: Secondary | ICD-10-CM | POA: Diagnosis present

## 2023-06-08 DIAGNOSIS — T794XXA Traumatic shock, initial encounter: Secondary | ICD-10-CM | POA: Diagnosis not present

## 2023-06-08 DIAGNOSIS — I9589 Other hypotension: Secondary | ICD-10-CM | POA: Diagnosis not present

## 2023-06-08 DIAGNOSIS — S36892A Contusion of other intra-abdominal organs, initial encounter: Secondary | ICD-10-CM | POA: Diagnosis present

## 2023-06-08 DIAGNOSIS — S271XXA Traumatic hemothorax, initial encounter: Secondary | ICD-10-CM | POA: Diagnosis not present

## 2023-06-08 DIAGNOSIS — Z79899 Other long term (current) drug therapy: Secondary | ICD-10-CM | POA: Diagnosis not present

## 2023-06-08 DIAGNOSIS — A419 Sepsis, unspecified organism: Secondary | ICD-10-CM | POA: Diagnosis not present

## 2023-06-08 DIAGNOSIS — R140 Abdominal distension (gaseous): Secondary | ICD-10-CM | POA: Diagnosis not present

## 2023-06-08 DIAGNOSIS — Z430 Encounter for attention to tracheostomy: Secondary | ICD-10-CM | POA: Diagnosis not present

## 2023-06-08 DIAGNOSIS — R509 Fever, unspecified: Secondary | ICD-10-CM | POA: Diagnosis not present

## 2023-06-08 DIAGNOSIS — R0989 Other specified symptoms and signs involving the circulatory and respiratory systems: Secondary | ICD-10-CM | POA: Diagnosis not present

## 2023-06-08 DIAGNOSIS — S31139A Puncture wound of abdominal wall without foreign body, unspecified quadrant without penetration into peritoneal cavity, initial encounter: Secondary | ICD-10-CM | POA: Diagnosis not present

## 2023-06-08 DIAGNOSIS — S199XXA Unspecified injury of neck, initial encounter: Secondary | ICD-10-CM | POA: Diagnosis not present

## 2023-06-08 DIAGNOSIS — R0902 Hypoxemia: Secondary | ICD-10-CM | POA: Diagnosis not present

## 2023-06-08 DIAGNOSIS — T07XXXA Unspecified multiple injuries, initial encounter: Secondary | ICD-10-CM | POA: Diagnosis not present

## 2023-06-08 DIAGNOSIS — E669 Obesity, unspecified: Secondary | ICD-10-CM | POA: Diagnosis not present

## 2023-06-08 DIAGNOSIS — R1310 Dysphagia, unspecified: Secondary | ICD-10-CM | POA: Diagnosis not present

## 2023-06-08 DIAGNOSIS — S36512A Primary blast injury of descending [left] colon, initial encounter: Secondary | ICD-10-CM | POA: Diagnosis not present

## 2023-06-08 DIAGNOSIS — Z930 Tracheostomy status: Secondary | ICD-10-CM | POA: Diagnosis not present

## 2023-06-08 DIAGNOSIS — F4311 Post-traumatic stress disorder, acute: Secondary | ICD-10-CM | POA: Diagnosis not present

## 2023-06-08 DIAGNOSIS — K599 Functional intestinal disorder, unspecified: Secondary | ICD-10-CM | POA: Diagnosis not present

## 2023-06-08 DIAGNOSIS — I700 Atherosclerosis of aorta: Secondary | ICD-10-CM | POA: Diagnosis not present

## 2023-06-08 DIAGNOSIS — Z9889 Other specified postprocedural states: Secondary | ICD-10-CM | POA: Diagnosis not present

## 2023-06-08 DIAGNOSIS — S299XXA Unspecified injury of thorax, initial encounter: Secondary | ICD-10-CM | POA: Diagnosis not present

## 2023-06-08 DIAGNOSIS — J9690 Respiratory failure, unspecified, unspecified whether with hypoxia or hypercapnia: Secondary | ICD-10-CM | POA: Diagnosis not present

## 2023-06-08 DIAGNOSIS — S21331A Puncture wound without foreign body of right front wall of thorax with penetration into thoracic cavity, initial encounter: Secondary | ICD-10-CM | POA: Diagnosis not present

## 2023-06-08 DIAGNOSIS — R051 Acute cough: Secondary | ICD-10-CM | POA: Diagnosis not present

## 2023-06-08 DIAGNOSIS — R188 Other ascites: Secondary | ICD-10-CM | POA: Diagnosis not present

## 2023-06-08 DIAGNOSIS — Z6841 Body Mass Index (BMI) 40.0 and over, adult: Secondary | ICD-10-CM | POA: Diagnosis not present

## 2023-06-08 DIAGNOSIS — J9811 Atelectasis: Secondary | ICD-10-CM | POA: Diagnosis not present

## 2023-06-08 HISTORY — PX: PARTIAL COLECTOMY: SHX5273

## 2023-06-08 HISTORY — PX: APPLICATION OF WOUND VAC: SHX5189

## 2023-06-08 HISTORY — PX: THROMBECTOMY BRACHIAL ARTERY: SHX6649

## 2023-06-08 HISTORY — PX: ARTERY REPAIR: SHX559

## 2023-06-08 HISTORY — PX: BYPASS AXILLA/BRACHIAL ARTERY: SHX6426

## 2023-06-08 HISTORY — PX: LAPAROTOMY: SHX154

## 2023-06-08 LAB — SURGICAL PCR SCREEN
MRSA, PCR: NEGATIVE
Staphylococcus aureus: POSITIVE — AB

## 2023-06-08 LAB — TYPE AND SCREEN
ABO/RH(D): A POS
Antibody Screen: NEGATIVE
Unit division: 0
Unit division: 0
Unit division: 0
Unit division: 0
Unit division: 0
Unit division: 0
Unit division: 0
Unit division: 0
Unit division: 0
Unit division: 0
Unit division: 0
Unit division: 0
Unit division: 0
Unit division: 0
Unit division: 0
Unit division: 0
Unit division: 0
Unit division: 0
Unit division: 0
Unit division: 0
Unit division: 0
Unit division: 0
Unit division: 0
Unit division: 0
Unit division: 0
Unit division: 0

## 2023-06-08 LAB — PREPARE FRESH FROZEN PLASMA
Unit division: 0
Unit division: 0
Unit division: 0
Unit division: 0
Unit division: 0
Unit division: 0
Unit division: 0
Unit division: 0
Unit division: 0
Unit division: 0
Unit division: 0
Unit division: 0
Unit division: 0
Unit division: 0
Unit division: 0
Unit division: 0
Unit division: 0
Unit division: 0
Unit division: 0

## 2023-06-08 LAB — PREPARE PLATELET PHERESIS
Unit division: 0
Unit division: 0

## 2023-06-08 LAB — BPAM RBC
Blood Product Expiration Date: 202408282359
Blood Product Expiration Date: 202408282359
Blood Product Expiration Date: 202408302359
Blood Product Expiration Date: 202408302359
Blood Product Expiration Date: 202408302359
Blood Product Expiration Date: 202408302359
Blood Product Expiration Date: 202409032359
Blood Product Expiration Date: 202409032359
Blood Product Expiration Date: 202409032359
Blood Product Expiration Date: 202409032359
Blood Product Expiration Date: 202409032359
Blood Product Expiration Date: 202409032359
Blood Product Expiration Date: 202409032359
Blood Product Expiration Date: 202409032359
Blood Product Expiration Date: 202409062359
Blood Product Expiration Date: 202409072359
Blood Product Expiration Date: 202409072359
Blood Product Expiration Date: 202409072359
Blood Product Expiration Date: 202409082359
Blood Product Expiration Date: 202409082359
Blood Product Expiration Date: 202409082359
Blood Product Expiration Date: 202409082359
Blood Product Expiration Date: 202409082359
Blood Product Expiration Date: 202409082359
Blood Product Expiration Date: 202409112359
Blood Product Expiration Date: 202409112359
ISSUE DATE / TIME: 202408141320
ISSUE DATE / TIME: 202408141320
ISSUE DATE / TIME: 202408141320
ISSUE DATE / TIME: 202408141320
ISSUE DATE / TIME: 202408141328
ISSUE DATE / TIME: 202408141328
ISSUE DATE / TIME: 202408141328
ISSUE DATE / TIME: 202408141328
ISSUE DATE / TIME: 202408141328
ISSUE DATE / TIME: 202408141328
ISSUE DATE / TIME: 202408141328
ISSUE DATE / TIME: 202408141336
ISSUE DATE / TIME: 202408141336
ISSUE DATE / TIME: 202408141336
ISSUE DATE / TIME: 202408141336
ISSUE DATE / TIME: 202408141336
ISSUE DATE / TIME: 202408141336
ISSUE DATE / TIME: 202408141336
ISSUE DATE / TIME: 202408141336
ISSUE DATE / TIME: 202408141510
ISSUE DATE / TIME: 202408141510
ISSUE DATE / TIME: 202408141811
ISSUE DATE / TIME: 202408142205
ISSUE DATE / TIME: 202408142205
ISSUE DATE / TIME: 202408142205
ISSUE DATE / TIME: 202408142205
Unit Type and Rh: 5100
Unit Type and Rh: 5100
Unit Type and Rh: 5100
Unit Type and Rh: 5100
Unit Type and Rh: 5100
Unit Type and Rh: 5100
Unit Type and Rh: 5100
Unit Type and Rh: 5100
Unit Type and Rh: 5100
Unit Type and Rh: 5100
Unit Type and Rh: 5100
Unit Type and Rh: 5100
Unit Type and Rh: 5100
Unit Type and Rh: 5100
Unit Type and Rh: 5100
Unit Type and Rh: 5100
Unit Type and Rh: 5100
Unit Type and Rh: 5100
Unit Type and Rh: 5100
Unit Type and Rh: 5100
Unit Type and Rh: 6200
Unit Type and Rh: 6200
Unit Type and Rh: 6200
Unit Type and Rh: 6200
Unit Type and Rh: 6200
Unit Type and Rh: 6200

## 2023-06-08 LAB — BPAM PLATELET PHERESIS
Blood Product Expiration Date: 202408152359
Blood Product Expiration Date: 202408162359
ISSUE DATE / TIME: 202408141331
ISSUE DATE / TIME: 202408142204
Unit Type and Rh: 6200
Unit Type and Rh: 5100

## 2023-06-08 LAB — BPAM FFP
Blood Product Expiration Date: 202408142359
Blood Product Expiration Date: 202408142359
Blood Product Expiration Date: 202408172359
Blood Product Expiration Date: 202408182359
Blood Product Expiration Date: 202408182359
Blood Product Expiration Date: 202408182359
Blood Product Expiration Date: 202408182359
Blood Product Expiration Date: 202408182359
Blood Product Expiration Date: 202408182359
Blood Product Expiration Date: 202408192359
Blood Product Expiration Date: 202408192359
Blood Product Expiration Date: 202408302359
Blood Product Expiration Date: 202408312359
Blood Product Expiration Date: 202408312359
Blood Product Expiration Date: 202409022359
Blood Product Expiration Date: 202409022359
Blood Product Expiration Date: 202409022359
Blood Product Expiration Date: 202409052359
Blood Product Expiration Date: 202409052359
Blood Product Expiration Date: 202408182359
Blood Product Expiration Date: 202408192359
Blood Product Expiration Date: 202408192359
Blood Product Expiration Date: 202408192359
ISSUE DATE / TIME: 202408141320
ISSUE DATE / TIME: 202408141320
ISSUE DATE / TIME: 202408141328
ISSUE DATE / TIME: 202408141328
ISSUE DATE / TIME: 202408141328
ISSUE DATE / TIME: 202408141328
ISSUE DATE / TIME: 202408141328
ISSUE DATE / TIME: 202408141328
ISSUE DATE / TIME: 202408141328
ISSUE DATE / TIME: 202408141328
ISSUE DATE / TIME: 202408141335
ISSUE DATE / TIME: 202408141335
ISSUE DATE / TIME: 202408141335
ISSUE DATE / TIME: 202408141335
ISSUE DATE / TIME: 202408141335
ISSUE DATE / TIME: 202408141339
ISSUE DATE / TIME: 202408141339
ISSUE DATE / TIME: 202408141415
ISSUE DATE / TIME: 202408141415
ISSUE DATE / TIME: 202408142203
ISSUE DATE / TIME: 202408142203
ISSUE DATE / TIME: 202408142203
ISSUE DATE / TIME: 202408142203
Unit Type and Rh: 600
Unit Type and Rh: 6200
Unit Type and Rh: 6200
Unit Type and Rh: 6200
Unit Type and Rh: 6200
Unit Type and Rh: 6200
Unit Type and Rh: 6200
Unit Type and Rh: 6200
Unit Type and Rh: 6200
Unit Type and Rh: 6200
Unit Type and Rh: 6200
Unit Type and Rh: 6200
Unit Type and Rh: 6200
Unit Type and Rh: 6200
Unit Type and Rh: 6200
Unit Type and Rh: 6200
Unit Type and Rh: 6200
Unit Type and Rh: 6200
Unit Type and Rh: 6200
Unit Type and Rh: 600
Unit Type and Rh: 6200
Unit Type and Rh: 6200
Unit Type and Rh: 6200

## 2023-06-08 LAB — CBC WITH DIFFERENTIAL/PLATELET
Abs Immature Granulocytes: 0.09 10*3/uL — ABNORMAL HIGH (ref 0.00–0.07)
Basophils Absolute: 0 10*3/uL (ref 0.0–0.1)
Basophils Relative: 0 %
Eosinophils Absolute: 0 10*3/uL (ref 0.0–0.5)
Eosinophils Relative: 0 %
HCT: 31.2 % — ABNORMAL LOW (ref 39.0–52.0)
Hemoglobin: 10.1 g/dL — ABNORMAL LOW (ref 13.0–17.0)
Immature Granulocytes: 1 %
Lymphocytes Relative: 10 %
Lymphs Abs: 1.6 10*3/uL (ref 0.7–4.0)
MCH: 26.3 pg (ref 26.0–34.0)
MCHC: 32.4 g/dL (ref 30.0–36.0)
MCV: 81.3 fL (ref 80.0–100.0)
Monocytes Absolute: 1.7 10*3/uL — ABNORMAL HIGH (ref 0.1–1.0)
Monocytes Relative: 11 %
Neutro Abs: 12.4 10*3/uL — ABNORMAL HIGH (ref 1.7–7.7)
Neutrophils Relative %: 78 %
Platelets: 149 10*3/uL — ABNORMAL LOW (ref 150–400)
RBC: 3.84 MIL/uL — ABNORMAL LOW (ref 4.22–5.81)
RDW: 16 % — ABNORMAL HIGH (ref 11.5–15.5)
WBC: 15.9 10*3/uL — ABNORMAL HIGH (ref 4.0–10.5)
nRBC: 0 % (ref 0.0–0.2)

## 2023-06-08 LAB — POCT I-STAT 7, (LYTES, BLD GAS, ICA,H+H)
Acid-base deficit: 7 mmol/L — ABNORMAL HIGH (ref 0.0–2.0)
Acid-base deficit: 5 mmol/L — ABNORMAL HIGH (ref 0.0–2.0)
Acid-base deficit: 4 mmol/L — ABNORMAL HIGH (ref 0.0–2.0)
Bicarbonate: 20.3 mmol/L (ref 20.0–28.0)
Bicarbonate: 22.4 mmol/L (ref 20.0–28.0)
Bicarbonate: 22.9 mmol/L (ref 20.0–28.0)
Calcium, Ion: 1 mmol/L — ABNORMAL LOW (ref 1.15–1.40)
Calcium, Ion: 1.32 mmol/L (ref 1.15–1.40)
Calcium, Ion: 1.26 mmol/L (ref 1.15–1.40)
HCT: 35 % — ABNORMAL LOW (ref 39.0–52.0)
HCT: 32 % — ABNORMAL LOW (ref 39.0–52.0)
HCT: 30 % — ABNORMAL LOW (ref 39.0–52.0)
Hemoglobin: 11.9 g/dL — ABNORMAL LOW (ref 13.0–17.0)
Hemoglobin: 10.9 g/dL — ABNORMAL LOW (ref 13.0–17.0)
Hemoglobin: 10.2 g/dL — ABNORMAL LOW (ref 13.0–17.0)
O2 Saturation: 96 %
O2 Saturation: 98 %
O2 Saturation: 97 %
Patient temperature: 35.9
Patient temperature: 97.6
Potassium: 5.5 mmol/L — ABNORMAL HIGH (ref 3.5–5.1)
Potassium: 4.7 mmol/L (ref 3.5–5.1)
Potassium: 4.4 mmol/L (ref 3.5–5.1)
Sodium: 138 mmol/L (ref 135–145)
Sodium: 140 mmol/L (ref 135–145)
Sodium: 141 mmol/L (ref 135–145)
TCO2: 22 mmol/L (ref 22–32)
TCO2: 24 mmol/L (ref 22–32)
TCO2: 24 mmol/L (ref 22–32)
pCO2 arterial: 48.3 mmHg — ABNORMAL HIGH (ref 32–48)
pCO2 arterial: 48.9 mmHg — ABNORMAL HIGH (ref 32–48)
pCO2 arterial: 47.8 mmHg (ref 32–48)
pH, Arterial: 7.231 — ABNORMAL LOW (ref 7.35–7.45)
pH, Arterial: 7.263 — ABNORMAL LOW (ref 7.35–7.45)
pH, Arterial: 7.285 — ABNORMAL LOW (ref 7.35–7.45)
pO2, Arterial: 98 mmHg (ref 83–108)
pO2, Arterial: 112 mmHg — ABNORMAL HIGH (ref 83–108)
pO2, Arterial: 99 mmHg (ref 83–108)

## 2023-06-08 LAB — PROTIME-INR
INR: 1.3 — ABNORMAL HIGH (ref 0.8–1.2)
Prothrombin Time: 16.4 seconds — ABNORMAL HIGH (ref 11.4–15.2)

## 2023-06-08 LAB — APTT: aPTT: 51 seconds — ABNORMAL HIGH (ref 24–36)

## 2023-06-08 LAB — COMPREHENSIVE METABOLIC PANEL
ALT: 100 U/L — ABNORMAL HIGH (ref 0–44)
AST: 92 U/L — ABNORMAL HIGH (ref 15–41)
Albumin: 3.3 g/dL — ABNORMAL LOW (ref 3.5–5.0)
Alkaline Phosphatase: 38 U/L (ref 38–126)
Anion gap: 10 (ref 5–15)
BUN: 18 mg/dL (ref 8–23)
CO2: 20 mmol/L — ABNORMAL LOW (ref 22–32)
Calcium: 8.4 mg/dL — ABNORMAL LOW (ref 8.9–10.3)
Chloride: 106 mmol/L (ref 98–111)
Creatinine, Ser: 1.63 mg/dL — ABNORMAL HIGH (ref 0.61–1.24)
GFR, Estimated: 46 mL/min — ABNORMAL LOW (ref >60)
Glucose, Bld: 153 mg/dL — ABNORMAL HIGH (ref 70–99)
Potassium: 4.4 mmol/L (ref 3.5–5.1)
Sodium: 136 mmol/L (ref 135–145)
Total Bilirubin: 0.9 mg/dL (ref 0.3–1.2)
Total Protein: 5.4 g/dL — ABNORMAL LOW (ref 6.5–8.1)

## 2023-06-08 LAB — PREPARE CRYOPRECIPITATE: Unit division: 0

## 2023-06-08 LAB — POCT ACTIVATED CLOTTING TIME
Activated Clotting Time: 226 s
Activated Clotting Time: 244 s
Activated Clotting Time: 269 s

## 2023-06-08 LAB — BPAM CRYOPRECIPITATE
Blood Product Expiration Date: 202408182359
ISSUE DATE / TIME: 202408141342
Unit Type and Rh: 7300

## 2023-06-08 LAB — CK TOTAL AND CKMB (NOT AT ARMC)
CK, MB: 10.9 ng/mL — ABNORMAL HIGH (ref 0.5–5.0)
Total CK: 669 U/L — ABNORMAL HIGH (ref 49–397)

## 2023-06-08 LAB — ABO/RH: ABO/RH(D): A POS

## 2023-06-08 LAB — MAGNESIUM: Magnesium: 1.9 mg/dL (ref 1.7–2.4)

## 2023-06-08 LAB — HIV ANTIBODY (ROUTINE TESTING W REFLEX): HIV Screen 4th Generation wRfx: NONREACTIVE

## 2023-06-08 LAB — ETHANOL: Alcohol, Ethyl (B): <10 mg/dL (ref <10)

## 2023-06-08 LAB — MASSIVE TRANSFUSION PROTOCOL ORDER (BLOOD BANK NOTIFICATION)

## 2023-06-08 SURGERY — REPAIR, ARTERY, BRACHIAL
Anesthesia: General | Site: Arm Upper | Laterality: Right

## 2023-06-08 SURGERY — LAPAROTOMY, EXPLORATORY
Anesthesia: General | Site: Abdomen

## 2023-06-08 MED ORDER — FENTANYL BOLUS VIA INFUSION
25.0000 ug | INTRAVENOUS | Status: DC | PRN
  Administered 2023-06-09 (×4): 100 ug via INTRAVENOUS
  Administered 2023-06-09 (×2): 50 ug via INTRAVENOUS
  Administered 2023-06-10 – 2023-06-14 (×12): 100 ug via INTRAVENOUS

## 2023-06-08 MED ORDER — SODIUM CHLORIDE 0.9% IV SOLUTION: Freq: Once | INTRAVENOUS | Status: AC

## 2023-06-08 MED ORDER — LIDOCAINE 2% (20 MG/ML) 5 ML SYRINGE
INTRAMUSCULAR | Status: DC | PRN
  Administered 2023-06-08: 50 mg via INTRAVENOUS

## 2023-06-08 MED ORDER — VASOPRESSIN 20 UNIT/ML IV SOLN
INTRAVENOUS | Status: AC
  Filled 2023-06-08: qty 1

## 2023-06-08 MED ORDER — HYDRALAZINE HCL 20 MG/ML IJ SOLN: 10.0000 mg | INTRAMUSCULAR | Status: DC | PRN

## 2023-06-08 MED ORDER — PROPOFOL 10 MG/ML IV BOLUS
INTRAVENOUS | Status: DC | PRN
  Administered 2023-06-08: 150 mg via INTRAVENOUS

## 2023-06-08 MED ORDER — ONDANSETRON HCL 4 MG/2ML IJ SOLN
4.0000 mg | Freq: Four times a day (QID) | INTRAMUSCULAR | Status: DC | PRN
  Administered 2023-06-15 – 2023-07-14 (×16): 4 mg via INTRAVENOUS
  Filled 2023-06-08 (×17): qty 2

## 2023-06-08 MED ORDER — ROCURONIUM BROMIDE 10 MG/ML (PF) SYRINGE
PREFILLED_SYRINGE | INTRAVENOUS | Status: AC
  Filled 2023-06-08: qty 10

## 2023-06-08 MED ORDER — FENTANYL CITRATE (PF) 250 MCG/5ML IJ SOLN
INTRAMUSCULAR | Status: AC
  Filled 2023-06-08: qty 5

## 2023-06-08 MED ORDER — ROCURONIUM BROMIDE 100 MG/10ML IV SOLN
INTRAVENOUS | Status: DC | PRN
  Administered 2023-06-08: 70 mg via INTRAVENOUS
  Administered 2023-06-08: 30 mg via INTRAVENOUS

## 2023-06-08 MED ORDER — TRANEXAMIC ACID-NACL 1000-0.7 MG/100ML-% IV SOLN
1000.0000 mg | Freq: Once | INTRAVENOUS | Status: AC
  Administered 2023-06-08: 1000 mg via INTRAVENOUS

## 2023-06-08 MED ORDER — DEXAMETHASONE SODIUM PHOSPHATE 10 MG/ML IJ SOLN
INTRAMUSCULAR | Status: DC | PRN
  Administered 2023-06-08: 10 mg via INTRAVENOUS

## 2023-06-08 MED ORDER — CEFAZOLIN SODIUM-DEXTROSE 2-4 GM/100ML-% IV SOLN
INTRAVENOUS | Status: AC
  Filled 2023-06-08: qty 100

## 2023-06-08 MED ORDER — ASPIRIN 81 MG PO TBEC: 81.0000 mg | DELAYED_RELEASE_TABLET | Freq: Every day | ORAL | Status: DC

## 2023-06-08 MED ORDER — HEMOSTATIC AGENTS (NO CHARGE) OPTIME
TOPICAL | Status: DC | PRN
  Administered 2023-06-08: 2 via TOPICAL

## 2023-06-08 MED ORDER — ALBUMIN HUMAN 5 % IV SOLN: INTRAVENOUS | Status: DC | PRN

## 2023-06-08 MED ORDER — CEFAZOLIN SODIUM-DEXTROSE 2-4 GM/100ML-% IV SOLN
2.0000 g | INTRAVENOUS | Status: AC
  Administered 2023-06-08: 2 g via INTRAVENOUS

## 2023-06-08 MED ORDER — PROMETHAZINE HCL 25 MG/ML IJ SOLN: 6.2500 mg | INTRAMUSCULAR | Status: DC | PRN

## 2023-06-08 MED ORDER — MIDAZOLAM HCL 5 MG/5ML IJ SOLN
INTRAMUSCULAR | Status: DC | PRN
  Administered 2023-06-08: 2 mg via INTRAVENOUS

## 2023-06-08 MED ORDER — NOREPINEPHRINE 4 MG/250ML-% IV SOLN
0.0000 ug/min | INTRAVENOUS | Status: DC
  Administered 2023-06-08: 2 ug/min via INTRAVENOUS
  Administered 2023-06-08: 40 ug/min via INTRAVENOUS
  Administered 2023-06-09: 10 ug/min via INTRAVENOUS
  Administered 2023-06-11: 5 ug/min via INTRAVENOUS
  Administered 2023-06-11: 2 ug/min via INTRAVENOUS
  Administered 2023-06-11: 11 ug/min via INTRAVENOUS
  Filled 2023-06-08 (×6): qty 250

## 2023-06-08 MED ORDER — GABAPENTIN 100 MG PO CAPS: 100.0000 mg | ORAL_CAPSULE | Freq: Three times a day (TID) | ORAL | Status: DC

## 2023-06-08 MED ORDER — HYDROMORPHONE HCL 1 MG/ML IJ SOLN
0.5000 mg | INTRAMUSCULAR | Status: DC | PRN
  Administered 2023-06-08: 0.5 mg via INTRAVENOUS
  Administered 2023-06-15: 1 mg via INTRAVENOUS
  Filled 2023-06-08 (×2): qty 1

## 2023-06-08 MED ORDER — HEPARIN 6000 UNIT IRRIGATION SOLUTION
Status: AC
  Filled 2023-06-08: qty 500

## 2023-06-08 MED ORDER — METHOCARBAMOL 1000 MG/10ML IJ SOLN
500.0000 mg | Freq: Three times a day (TID) | INTRAVENOUS | Status: DC
  Administered 2023-06-09: 500 mg via INTRAVENOUS
  Filled 2023-06-08: qty 500

## 2023-06-08 MED ORDER — SUCCINYLCHOLINE CHLORIDE 20 MG/ML IJ SOLN
INTRAMUSCULAR | Status: DC | PRN
  Administered 2023-06-08: 140 mg via INTRAVENOUS

## 2023-06-08 MED ORDER — HYDROMORPHONE HCL 1 MG/ML IJ SOLN: 1.0000 mg | INTRAMUSCULAR | Status: DC | PRN

## 2023-06-08 MED ORDER — SODIUM CHLORIDE 0.9 % IV SOLN: INTRAVENOUS | Status: DC | PRN

## 2023-06-08 MED ORDER — LACTATED RINGERS IV BOLUS: 500.0000 mL | Freq: Once | INTRAVENOUS | Status: DC

## 2023-06-08 MED ORDER — PHENYLEPHRINE HCL-NACL 20-0.9 MG/250ML-% IV SOLN
INTRAVENOUS | Status: DC | PRN
  Administered 2023-06-08: 50 ug/min via INTRAVENOUS

## 2023-06-08 MED ORDER — METOPROLOL TARTRATE 5 MG/5ML IV SOLN: 5.0000 mg | Freq: Four times a day (QID) | INTRAVENOUS | Status: DC | PRN

## 2023-06-08 MED ORDER — FENTANYL CITRATE PF 50 MCG/ML IJ SOSY
25.0000 ug | PREFILLED_SYRINGE | Freq: Once | INTRAMUSCULAR | Status: DC
  Filled 2023-06-08: qty 1

## 2023-06-08 MED ORDER — FENTANYL CITRATE (PF) 250 MCG/5ML IJ SOLN
INTRAMUSCULAR | Status: DC | PRN
  Administered 2023-06-08 (×2): 50 ug via INTRAVENOUS

## 2023-06-08 MED ORDER — PHENYLEPHRINE HCL-NACL 20-0.9 MG/250ML-% IV SOLN: 0.0000 ug/min | INTRAVENOUS | Status: DC

## 2023-06-08 MED ORDER — PHENYLEPHRINE HCL-NACL 20-0.9 MG/250ML-% IV SOLN
INTRAVENOUS | Status: DC | PRN
  Administered 2023-06-08: 25 ug/min via INTRAVENOUS

## 2023-06-08 MED ORDER — TRANEXAMIC ACID 1000 MG/10ML IV SOLN
1000.0000 mg | Freq: Once | INTRAVENOUS | Status: AC
  Administered 2023-06-08: 1000 mg via INTRAVENOUS
  Filled 2023-06-08: qty 10

## 2023-06-08 MED ORDER — PROPOFOL 10 MG/ML IV BOLUS
INTRAVENOUS | Status: AC
  Filled 2023-06-08: qty 20

## 2023-06-08 MED ORDER — LACTATED RINGERS IV BOLUS
1000.0000 mL | Freq: Once | INTRAVENOUS | Status: AC
  Administered 2023-06-08: 1000 mL via INTRAVENOUS

## 2023-06-08 MED ORDER — PHENYLEPHRINE 80 MCG/ML (10ML) SYRINGE FOR IV PUSH (FOR BLOOD PRESSURE SUPPORT)
PREFILLED_SYRINGE | INTRAVENOUS | Status: DC | PRN
  Administered 2023-06-08: 160 ug via INTRAVENOUS

## 2023-06-08 MED ORDER — FENTANYL CITRATE (PF) 100 MCG/2ML IJ SOLN
INTRAMUSCULAR | Status: DC | PRN
  Administered 2023-06-08: 50 ug via INTRAVENOUS
  Administered 2023-06-08: 100 ug via INTRAVENOUS
  Administered 2023-06-08: 50 ug via INTRAVENOUS

## 2023-06-08 MED ORDER — LACTATED RINGERS IV SOLN: INTRAVENOUS | Status: AC

## 2023-06-08 MED ORDER — LIDOCAINE 2% (20 MG/ML) 5 ML SYRINGE
INTRAMUSCULAR | Status: AC
  Filled 2023-06-08: qty 5

## 2023-06-08 MED ORDER — OXYCODONE HCL 5 MG PO TABS: 5.0000 mg | ORAL_TABLET | Freq: Once | ORAL | Status: DC | PRN

## 2023-06-08 MED ORDER — EPHEDRINE SULFATE-NACL 50-0.9 MG/10ML-% IV SOSY
PREFILLED_SYRINGE | INTRAVENOUS | Status: DC | PRN
  Administered 2023-06-08 (×3): 5 mg via INTRAVENOUS

## 2023-06-08 MED ORDER — SUCCINYLCHOLINE 20MG/ML (10ML) SYRINGE FOR MEDFUSION PUMP - OPTIME
INTRAMUSCULAR | Status: DC | PRN
  Administered 2023-06-08: 120 mg via INTRAVENOUS

## 2023-06-08 MED ORDER — PROPOFOL 1000 MG/100ML IV EMUL
0.0000 ug/kg/min | INTRAVENOUS | Status: AC
  Administered 2023-06-09: 35 ug/kg/min via INTRAVENOUS
  Administered 2023-06-09: 30 ug/kg/min via INTRAVENOUS
  Administered 2023-06-09: 40 ug/kg/min via INTRAVENOUS
  Filled 2023-06-08 (×2): qty 100

## 2023-06-08 MED ORDER — CEFAZOLIN SODIUM-DEXTROSE 2-4 GM/100ML-% IV SOLN
2.0000 g | Freq: Once | INTRAVENOUS | Status: AC
  Administered 2023-06-08: 2 g via INTRAVENOUS
  Filled 2023-06-08: qty 100

## 2023-06-08 MED ORDER — DOCUSATE SODIUM 100 MG PO CAPS: 100.0000 mg | ORAL_CAPSULE | Freq: Two times a day (BID) | ORAL | Status: DC

## 2023-06-08 MED ORDER — VASOPRESSIN 20 UNIT/ML IV SOLN
INTRAVENOUS | Status: DC | PRN
  Administered 2023-06-08: 2 [IU] via INTRAVENOUS
  Administered 2023-06-08 (×2): 1 [IU] via INTRAVENOUS
  Administered 2023-06-08: 2 [IU] via INTRAVENOUS

## 2023-06-08 MED ORDER — HEPARIN 6000 UNIT IRRIGATION SOLUTION
Status: DC | PRN
  Administered 2023-06-08: 1

## 2023-06-08 MED ORDER — MIDAZOLAM HCL 2 MG/2ML IJ SOLN
INTRAMUSCULAR | Status: AC
  Filled 2023-06-08: qty 2

## 2023-06-08 MED ORDER — ACETAMINOPHEN 500 MG PO TABS: 1000.0000 mg | ORAL_TABLET | Freq: Four times a day (QID) | ORAL | Status: DC

## 2023-06-08 MED ORDER — FENTANYL CITRATE PF 50 MCG/ML IJ SOSY
PREFILLED_SYRINGE | INTRAMUSCULAR | Status: AC
  Filled 2023-06-08: qty 1

## 2023-06-08 MED ORDER — FENTANYL 2500MCG IN NS 250ML (10MCG/ML) PREMIX INFUSION
0.0000 ug/h | INTRAVENOUS | Status: DC
  Administered 2023-06-09: 350 ug/h via INTRAVENOUS
  Administered 2023-06-09: 200 ug/h via INTRAVENOUS
  Administered 2023-06-09: 25 ug/h via INTRAVENOUS
  Administered 2023-06-10: 350 ug/h via INTRAVENOUS
  Administered 2023-06-10 (×3): 400 ug/h via INTRAVENOUS
  Administered 2023-06-11: 200 ug/h via INTRAVENOUS
  Administered 2023-06-13: 75 ug/h via INTRAVENOUS
  Administered 2023-06-14 (×2): 50 ug/h via INTRAVENOUS
  Filled 2023-06-08 (×11): qty 250

## 2023-06-08 MED ORDER — METRONIDAZOLE 500 MG/100ML IV SOLN
500.0000 mg | INTRAVENOUS | Status: AC
  Administered 2023-06-08: 500 mg via INTRAVENOUS
  Filled 2023-06-08: qty 100

## 2023-06-08 MED ORDER — HEPARIN SODIUM (PORCINE) 1000 UNIT/ML IJ SOLN
INTRAMUSCULAR | Status: DC | PRN
  Administered 2023-06-08: 10000 [IU] via INTRAVENOUS
  Administered 2023-06-08: 3000 [IU] via INTRAVENOUS

## 2023-06-08 MED ORDER — OXYCODONE HCL 5 MG PO TABS: 5.0000 mg | ORAL_TABLET | ORAL | Status: DC | PRN

## 2023-06-08 MED ORDER — ROCURONIUM BROMIDE 10 MG/ML (PF) SYRINGE
PREFILLED_SYRINGE | INTRAVENOUS | Status: DC | PRN
  Administered 2023-06-08: 50 mg via INTRAVENOUS

## 2023-06-08 MED ORDER — POLYETHYLENE GLYCOL 3350 17 G PO PACK: 17.0000 g | PACK | Freq: Every day | ORAL | Status: DC | PRN

## 2023-06-08 MED ORDER — OXYCODONE HCL 5 MG/5ML PO SOLN: 5.0000 mg | Freq: Once | ORAL | Status: DC | PRN

## 2023-06-08 MED ORDER — SUGAMMADEX SODIUM 200 MG/2ML IV SOLN
INTRAVENOUS | Status: DC | PRN
  Administered 2023-06-08: 400 mg via INTRAVENOUS

## 2023-06-08 MED ORDER — FENTANYL CITRATE (PF) 100 MCG/2ML IJ SOLN: 25.0000 ug | INTRAMUSCULAR | Status: DC | PRN

## 2023-06-08 MED ORDER — METHOCARBAMOL 500 MG PO TABS: 500.0000 mg | ORAL_TABLET | Freq: Three times a day (TID) | ORAL | Status: DC

## 2023-06-08 MED ORDER — CEFAZOLIN SODIUM-DEXTROSE 2-3 GM-%(50ML) IV SOLR
INTRAVENOUS | Status: DC | PRN
  Administered 2023-06-08: 2 g via INTRAVENOUS

## 2023-06-08 MED ORDER — ONDANSETRON 4 MG PO TBDP
4.0000 mg | ORAL_TABLET | Freq: Four times a day (QID) | ORAL | Status: DC | PRN
  Administered 2023-07-16: 4 mg via ORAL
  Filled 2023-06-08: qty 1

## 2023-06-08 MED ORDER — PHENYLEPHRINE HCL (PRESSORS) 10 MG/ML IV SOLN
INTRAVENOUS | Status: DC | PRN
  Administered 2023-06-08 (×2): 80 ug via INTRAVENOUS

## 2023-06-08 MED ORDER — SODIUM CHLORIDE 0.9% IV SOLUTION: Freq: Once | INTRAVENOUS | Status: DC

## 2023-06-08 MED ORDER — PHENYLEPHRINE HCL-NACL 20-0.9 MG/250ML-% IV SOLN
0.0000 ug/min | INTRAVENOUS | Status: AC
  Administered 2023-06-08: 80 ug/min via INTRAVENOUS
  Administered 2023-06-08: 120 ug/min via INTRAVENOUS
  Filled 2023-06-08: qty 250

## 2023-06-08 MED ORDER — 0.9 % SODIUM CHLORIDE (POUR BTL) OPTIME
TOPICAL | Status: DC | PRN
  Administered 2023-06-08: 2000 mL

## 2023-06-08 MED ORDER — LIDOCAINE 2% (20 MG/ML) 5 ML SYRINGE
INTRAMUSCULAR | Status: DC | PRN
  Administered 2023-06-08: 60 mg via INTRAVENOUS

## 2023-06-08 MED ORDER — LACTATED RINGERS IV SOLN: INTRAVENOUS | Status: DC | PRN

## 2023-06-08 MED ORDER — NOREPINEPHRINE 4 MG/250ML-% IV SOLN
INTRAVENOUS | Status: AC
  Filled 2023-06-08: qty 250

## 2023-06-08 MED ORDER — 0.9 % SODIUM CHLORIDE (POUR BTL) OPTIME
TOPICAL | Status: DC | PRN
  Administered 2023-06-08: 1000 mL
  Administered 2023-06-08: 3000 mL

## 2023-06-08 MED ORDER — PROPOFOL 10 MG/ML IV BOLUS
INTRAVENOUS | Status: DC | PRN
Start: 2023-06-08 — End: 2023-06-08
  Administered 2023-06-08: 100 mg via INTRAVENOUS

## 2023-06-08 MED ORDER — SODIUM BICARBONATE 8.4 % IV SOLN
INTRAVENOUS | Status: AC | PRN
Start: 2023-06-08 — End: ?
  Administered 2023-06-08: 50 meq via INTRAVENOUS

## 2023-06-08 MED ORDER — ALBUMIN HUMAN 5 % IV SOLN
INTRAVENOUS | Status: DC | PRN
Start: 2023-06-08 — End: 2023-06-09

## 2023-06-08 MED ORDER — LACTATED RINGERS IV SOLN
INTRAVENOUS | Status: AC | PRN
Start: 2023-06-08 — End: ?

## 2023-06-08 MED ORDER — CALCIUM CHLORIDE 10 % IV SOLN
INTRAVENOUS | Status: DC | PRN
Start: 2023-06-08 — End: 2023-06-08
  Administered 2023-06-08: 1 g via INTRAVENOUS

## 2023-06-08 MED ORDER — PROTAMINE SULFATE 10 MG/ML IV SOLN
INTRAVENOUS | Status: DC | PRN
Start: 2023-06-08 — End: 2023-06-08
  Administered 2023-06-08: 30 mg via INTRAVENOUS

## 2023-06-08 MED ORDER — CEFAZOLIN SODIUM-DEXTROSE 1-4 GM/50ML-% IV SOLN
INTRAVENOUS | Status: DC | PRN
Start: 2023-06-08 — End: 2023-06-08
  Administered 2023-06-08: 1 g via INTRAVENOUS

## 2023-06-08 MED ORDER — SODIUM CHLORIDE 0.9 % IV SOLN
INTRAVENOUS | Status: DC | PRN
Start: 2023-06-08 — End: 2023-06-08

## 2023-06-08 SURGICAL SUPPLY — 50 items
ADH SKN CLS APL DERMABOND .7 (GAUZE/BANDAGES/DRESSINGS) ×3
BAG COUNTER SPONGE SURGICOUNT (BAG) ×4 IMPLANT
BAG SPNG CNTER NS LX DISP (BAG) ×3
BNDG CMPR 9X4 STRL LF SNTH (GAUZE/BANDAGES/DRESSINGS)
BNDG CMPR MED 10X6 ELC LF (GAUZE/BANDAGES/DRESSINGS) ×3
BNDG ELASTIC 4X5.8 VLCR STR LF (GAUZE/BANDAGES/DRESSINGS) ×4 IMPLANT
BNDG ELASTIC 6X10 VLCR STRL LF (GAUZE/BANDAGES/DRESSINGS) IMPLANT
BNDG ESMARK 4X9 LF (GAUZE/BANDAGES/DRESSINGS) IMPLANT
BNDG GAUZE DERMACEA FLUFF 4 (GAUZE/BANDAGES/DRESSINGS) IMPLANT
BNDG GZE DERMACEA 4 6PLY (GAUZE/BANDAGES/DRESSINGS) ×3
CANISTER SUCT 3000ML PPV (MISCELLANEOUS) ×4 IMPLANT
CANNULA VESSEL 3MM 2 BLNT TIP (CANNULA) IMPLANT
CLIP TI MEDIUM 24 (CLIP) ×4 IMPLANT
CLIP TI MEDIUM 6 (CLIP) ×4 IMPLANT
CLIP TI WIDE RED SMALL 24 (CLIP) ×4 IMPLANT
CUFF TOURN SGL QUICK 18X4 (TOURNIQUET CUFF) IMPLANT
CUFF TOURN SGL QUICK 24 (TOURNIQUET CUFF)
CUFF TRNQT CYL 24X4X16.5-23 (TOURNIQUET CUFF) IMPLANT
DERMABOND ADVANCED .7 DNX12 (GAUZE/BANDAGES/DRESSINGS) ×4 IMPLANT
DRAIN CHANNEL 15F RND FF W/TCR (WOUND CARE) IMPLANT
DRAIN RELI 100 BL SUC LF ST (DRAIN) ×3
ELECT REM PT RETURN 9FT ADLT (ELECTROSURGICAL) ×3
ELECTRODE REM PT RTRN 9FT ADLT (ELECTROSURGICAL) ×4 IMPLANT
EVACUATOR SILICONE 100CC (DRAIN) IMPLANT
GAUZE XEROFORM 5X9 LF (GAUZE/BANDAGES/DRESSINGS) IMPLANT
GLOVE BIOGEL PI IND STRL 8 (GLOVE) ×4 IMPLANT
GOWN STRL REUS W/ TWL LRG LVL3 (GOWN DISPOSABLE) ×8 IMPLANT
GOWN STRL REUS W/TWL 2XL LVL3 (GOWN DISPOSABLE) ×8 IMPLANT
GOWN STRL REUS W/TWL LRG LVL3 (GOWN DISPOSABLE) ×6
HEMOSTAT SNOW SURGICEL 2X4 (HEMOSTASIS) IMPLANT
KIT BASIN OR (CUSTOM PROCEDURE TRAY) ×4 IMPLANT
KIT TURNOVER KIT B (KITS) ×4 IMPLANT
NS IRRIG 1000ML POUR BTL (IV SOLUTION) ×4 IMPLANT
PACK CV ACCESS (CUSTOM PROCEDURE TRAY) ×4 IMPLANT
PAD ABD 8X10 STRL (GAUZE/BANDAGES/DRESSINGS) IMPLANT
PAD ARMBOARD 7.5X6 YLW CONV (MISCELLANEOUS) ×8 IMPLANT
SPIKE FLUID TRANSFER (MISCELLANEOUS) IMPLANT
STAPLER VISISTAT 35W (STAPLE) IMPLANT
SUT ETHILON 3 0 PS 1 (SUTURE) IMPLANT
SUT MNCRL AB 4-0 PS2 18 (SUTURE) ×4 IMPLANT
SUT PROLENE 5 0 C 1 24 (SUTURE) IMPLANT
SUT PROLENE 6 0 BV (SUTURE) ×4 IMPLANT
SUT VIC AB 2-0 CT1 27 (SUTURE) ×3
SUT VIC AB 2-0 CT1 TAPERPNT 27 (SUTURE) IMPLANT
SUT VIC AB 3-0 SH 27 (SUTURE) ×6
SUT VIC AB 3-0 SH 27X BRD (SUTURE) ×4 IMPLANT
SYR 20CC LL (SYRINGE) IMPLANT
TOWEL GREEN STERILE (TOWEL DISPOSABLE) ×4 IMPLANT
UNDERPAD 30X36 HEAVY ABSORB (UNDERPADS AND DIAPERS) ×4 IMPLANT
WATER STERILE IRR 1000ML POUR (IV SOLUTION) ×4 IMPLANT

## 2023-06-08 SURGICAL SUPPLY — 55 items
ADH SKN CLS APL DERMABOND .7 (GAUZE/BANDAGES/DRESSINGS)
APL PRP STRL LF DISP 70% ISPRP (MISCELLANEOUS) ×2
APL SKNCLS STERI-STRIP NONHPOA (GAUZE/BANDAGES/DRESSINGS) ×4
BAG COUNTER SPONGE SURGICOUNT (BAG) ×3 IMPLANT
BAG SPNG CNTER NS LX DISP (BAG) ×2
BENZOIN TINCTURE PRP APPL 2/3 (GAUZE/BANDAGES/DRESSINGS) IMPLANT
BLADE CLIPPER SURG (BLADE) IMPLANT
CANISTER SUCT 3000ML PPV (MISCELLANEOUS) ×3 IMPLANT
CANISTER WOUND CARE 500ML ATS (WOUND CARE) IMPLANT
CANISTER WOUNDNEG PRESSURE 500 (CANNISTER) IMPLANT
CHLORAPREP W/TINT 26 (MISCELLANEOUS) ×3 IMPLANT
COVER SURGICAL LIGHT HANDLE (MISCELLANEOUS) ×3 IMPLANT
DERMABOND ADVANCED .7 DNX12 (GAUZE/BANDAGES/DRESSINGS) ×6 IMPLANT
DRAPE INCISE IOBAN 66X45 STRL (DRAPES) ×3 IMPLANT
DRAPE LAPAROSCOPIC ABDOMINAL (DRAPES) ×3 IMPLANT
DRAPE WARM FLUID 44X44 (DRAPES) ×3 IMPLANT
DRSG OPSITE POSTOP 4X10 (GAUZE/BANDAGES/DRESSINGS) IMPLANT
DRSG OPSITE POSTOP 4X8 (GAUZE/BANDAGES/DRESSINGS) IMPLANT
ELECT BLADE 6.5 EXT (BLADE) IMPLANT
ELECT CAUTERY BLADE 6.4 (BLADE) ×3 IMPLANT
ELECT REM PT RETURN 9FT ADLT (ELECTROSURGICAL) ×2
ELECTRODE REM PT RTRN 9FT ADLT (ELECTROSURGICAL) ×3 IMPLANT
GLOVE BIOGEL PI IND STRL 6 (GLOVE) ×3 IMPLANT
GLOVE BIOGEL PI MICRO STRL 5.5 (GLOVE) ×3 IMPLANT
GOWN STRL REUS W/ TWL LRG LVL3 (GOWN DISPOSABLE) ×6 IMPLANT
GOWN STRL REUS W/TWL LRG LVL3 (GOWN DISPOSABLE) ×4
HANDLE SUCTION POOLE (INSTRUMENTS) ×3 IMPLANT
KIT BASIN OR (CUSTOM PROCEDURE TRAY) ×3 IMPLANT
KIT TURNOVER KIT B (KITS) ×3 IMPLANT
LIGASURE IMPACT 36 18CM CVD LR (INSTRUMENTS) IMPLANT
NS IRRIG 1000ML POUR BTL (IV SOLUTION) ×6 IMPLANT
PACK GENERAL/GYN (CUSTOM PROCEDURE TRAY) ×3 IMPLANT
PAD ARMBOARD 7.5X6 YLW CONV (MISCELLANEOUS) ×3 IMPLANT
PENCIL SMOKE EVACUATOR (MISCELLANEOUS) ×3 IMPLANT
RELOAD PROXIMATE 75MM BLUE (ENDOMECHANICALS) ×6 IMPLANT
RELOAD STAPLE 75 3.8 BLU REG (ENDOMECHANICALS) IMPLANT
SLEEVE SUCTION CATH 165 (SLEEVE) ×3 IMPLANT
SPECIMEN JAR LARGE (MISCELLANEOUS) IMPLANT
SPONGE ABD ABTHERA ADVANCE (MISCELLANEOUS) IMPLANT
SPONGE T-LAP 18X18 ~~LOC~~+RFID (SPONGE) IMPLANT
STAPLER PROXIMATE 75MM BLUE (STAPLE) IMPLANT
STAPLER VISISTAT 35W (STAPLE) IMPLANT
SUCTION POOLE HANDLE (INSTRUMENTS) ×2
SUT MNCRL AB 4-0 PS2 18 (SUTURE) ×6 IMPLANT
SUT PDS AB 1 TP1 96 (SUTURE) ×6 IMPLANT
SUT SILK 2 0 SH CR/8 (SUTURE) ×3 IMPLANT
SUT SILK 2 0 TIES 10X30 (SUTURE) ×3 IMPLANT
SUT SILK 3 0 SH CR/8 (SUTURE) ×3 IMPLANT
SUT SILK 3 0 TIES 10X30 (SUTURE) ×3 IMPLANT
SUT VIC AB 3-0 SH 18 (SUTURE) IMPLANT
SUT VIC AB 3-0 SH 27 (SUTURE)
SUT VIC AB 3-0 SH 27XBRD (SUTURE) ×6 IMPLANT
TOWEL GREEN STERILE (TOWEL DISPOSABLE) ×3 IMPLANT
TRAY FOLEY MTR SLVR 16FR STAT (SET/KITS/TRAYS/PACK) IMPLANT
YANKAUER SUCT BULB TIP NO VENT (SUCTIONS) IMPLANT
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
## 2023-06-08 NOTE — ED Notes (Signed)
Emergency blood started.  

## 2023-06-08 NOTE — Anesthesia Procedure Notes (Signed)
Arterial Line Insertion Start/End8/14/2024 1:59 PM, 06/08/2023 2:02 PM Performed by: Darryl Nestle, CRNA, CRNA  Patient location: OR. Preanesthetic checklist: patient identified, IV checked, site marked, risks and benefits discussed, surgical consent, monitors and equipment checked, pre-op evaluation, timeout performed and anesthesia consent Emergency situation Patient sedated Left, radial was placed Catheter size: 20 G Hand hygiene performed  and maximum sterile barriers used  Allen's test indicative of satisfactory collateral circulation Attempts: 1 Procedure performed using ultrasound guided technique. Following insertion, Biopatch and dressing applied. Post procedure assessment: normal and unchanged  Patient tolerated the procedure well with no immediate complications.
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
## 2023-06-08 NOTE — Anesthesia Procedure Notes (Addendum)
Procedure Name: Intubation Date/Time: 06/08/2023 2:00 PM  Performed by: Beryle Lathe, MDPre-anesthesia Checklist: Patient identified, Emergency Drugs available, Suction available, Patient being monitored and Timeout performed Patient Re-evaluated:Patient Re-evaluated prior to induction Oxygen Delivery Method: Circle System Utilized Preoxygenation: Pre-oxygenation with 100% oxygen Induction Type: IV induction, Rapid sequence and Cricoid Pressure applied Laryngoscope Size: Miller and 3 Grade View: Grade I Tube type: Oral Tube size: 7.5 mm Number of attempts: 2 Airway Equipment and Method: Stylet Placement Confirmation: ETT inserted through vocal cords under direct vision, positive ETCO2 and breath sounds checked- equal and bilateral Secured at: 23 cm Tube secured with: Tape Dental Injury: Teeth and Oropharynx as per pre-operative assessment  Difficulty Due To: Difficult Airway-  due to edematous airway Comments: 1st attempt by CRNA Grade III view w/MAC 4 blade
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
## 2023-06-08 NOTE — Procedures (Signed)
   Procedure Note  Date: 06/08/2023  Procedure: central venous catheter placement--right, femoral vein, without ultrasound guidance  Pre-op diagnosis: inadequate IV access, unable to obtain additional peripheral access Post-op diagnosis: same  Surgeon: Diamantina Monks, MD  Anesthesia: local  EBL: <5cc Drains/Implants:   single  lumen central venous catheter  Description of procedure:  This procedure was performed emergently, therefore informed consent was not obtained, and was performed under non-sterile conditions. The right femoral  vein was localized using anatomic landmarks, accessed using an introducer needle, and a guidewire passed through the needle. The needle was removed and a skin nick was made. The tract was dilated and the central venous catheter advanced over the guidewire followed by removal of the guidewire. All ports drew blood easily and all were flushed with saline. The catheter was secured to the skin with suture.   Diamantina Monks, MD General and Trauma Surgery [facility]

## 2023-06-08 NOTE — ED Notes (Signed)
Robins at bedside

## 2023-06-08 NOTE — Anesthesia Postprocedure Evaluation (Signed)
Anesthesia Post Note  Patient: Russell Garcia  Procedure(s) Performed: BRACHIAL ARTERY REPAIR THROMBECTOMY BRACHIAL ARTERY (Right: Arm Upper) BYPASS AXILLA/BRACHIAL ARTERY (Right)     Patient location during evaluation: PACU Anesthesia Type: General Level of consciousness: awake and alert Pain management: pain level controlled Vital Signs Assessment: post-procedure vital signs reviewed and stable Respiratory status: spontaneous breathing, nonlabored ventilation, respiratory function stable and patient connected to nasal cannula oxygen Cardiovascular status: stable Postop Assessment: no apparent nausea or vomiting Anesthetic complications: no   No notable events documented.  Last Vitals:  Vitals:   06/08/23 1645 06/08/23 1700  BP: 90/70 (!) 88/66  Pulse: 90 86  Resp: 16 (!) 21  Temp:  (!) 36.1 C  SpO2: 97% 99%    Last Pain:  Vitals:   06/08/23 1645  TempSrc:   PainSc: Asleep                 Beryle Lathe

## 2023-06-08 NOTE — TOC CAGE-AID Note (Signed)
Transition of Care [facility]) - CAGE-AID Screening   Patient Details  Name: Russell Garcia MRN: [id number] Date of Birth: 05-Apr-1956  Transition of [facility]) CM/SW Contact:    Katha Hamming, RN Phone Number: 06/08/2023, 8:55 PM   Clinical Narrative:  Denies drug/alcohol use  CAGE-AID Screening:    Have You Ever Felt You Ought to Cut Down on Your Drinking or Drug Use?: No Have People Annoyed You By Critizing Your Drinking Or Drug Use?: No Have You Felt Bad Or Guilty About Your Drinking Or Drug Use?: No Have You Ever Had a Drink or Used Drugs First Thing In The Morning to Steady Your Nerves or to Get Rid of a Hangover?: No CAGE-AID Score: 0  Substance Abuse Education Offered: No
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
## 2023-06-08 NOTE — ED Notes (Signed)
TXA finished

## 2023-06-08 NOTE — ED Notes (Signed)
Ancef finished

## 2023-06-08 NOTE — Procedures (Signed)
FAST Exam  Pre-procedure diagnosis: hypotension Post-procedure diagnosis: same  Procedure: FAST  Surgeon: Kris Mouton, MD  Description of procedure: The patient's abdomen was imaged in four regions with the ultrasound. First, the right upper quadrant was imaged. No free fluid was seen between the right kidney and the liver in Morison's pouch. Next, the epigastrium was imaged. No significant pericardial effusion was seen. Next, the left upper quadrant was imaged. No free fluid was seen between the left kidney and the spleen. Finally, the bladder was imaged. No free fluid was seen next to the bladder in the pelvis.  Impression: Negative  Diamantina Monks, MD General and Trauma Surgery [facility]
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
## 2023-06-08 NOTE — Transfer of Care (Signed)
Immediate Anesthesia Transfer of Care Note  Patient: Russell Garcia  Procedure(s) Performed: BRACHIAL ARTERY REPAIR THROMBECTOMY BRACHIAL ARTERY (Right: Arm Upper) BYPASS AXILLA/BRACHIAL ARTERY (Right)  Patient Location: PACU  Anesthesia Type:General  Level of Consciousness: drowsy and patient cooperative  Airway & Oxygen Therapy: Patient Spontanous Breathing and Patient connected to face mask oxygen  Post-op Assessment: Report given to RN, Post -op Vital signs reviewed and stable, and Patient moving all extremities X 4  Post vital signs: Reviewed and stable  Last Vitals:  Vitals Value Taken Time  BP 127/99 06/08/23 1615  Temp    Pulse 81 06/08/23 1618  Resp 9 06/08/23 1618  SpO2 97 % 06/08/23 1618  Vitals shown include unfiled device data.  Last Pain:  Vitals:   06/08/23 1415  TempSrc: Temporal  PainSc: 10-Worst pain ever         Complications: No notable events documented.
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
## 2023-06-08 NOTE — Progress Notes (Addendum)
Post-op CXR reviewed. Noted small R PTX. Reassess size on CT planned for later and repeat CXR in AM.   Diamantina Monks, MD General and Trauma Surgery [facility]
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
## 2023-06-08 NOTE — Anesthesia Procedure Notes (Signed)
Procedure Name: Intubation Date/Time: 06/08/2023 9:53 PM  Performed by: Edmonia Caprio, CRNAPre-anesthesia Checklist: Patient identified, Emergency Drugs available, Suction available, Patient being monitored and Timeout performed Patient Re-evaluated:Patient Re-evaluated prior to induction Oxygen Delivery Method: Circle system utilized Preoxygenation: Pre-oxygenation with 100% oxygen Induction Type: Rapid sequence and IV induction Laryngoscope Size: Glidescope and 3 Grade View: Grade II Tube type: Oral Tube size: 7.5 mm Number of attempts: 1 Airway Equipment and Method: Stylet and Video-laryngoscopy Placement Confirmation: ETT inserted through vocal cords under direct vision, positive ETCO2 and breath sounds checked- equal and bilateral Secured at: 25 cm Tube secured with: Tape Dental Injury: Teeth and Oropharynx as per pre-operative assessment  Comments: Used glidescope 3- recommend glidescope 4
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
## 2023-06-09 ENCOUNTER — Inpatient Hospital Stay (HOSPITAL_COMMUNITY): Payer: Medicare HMO

## 2023-06-09 ENCOUNTER — Encounter (HOSPITAL_COMMUNITY): Payer: Self-pay | Admitting: Vascular Surgery

## 2023-06-09 DIAGNOSIS — J9811 Atelectasis: Secondary | ICD-10-CM | POA: Diagnosis not present

## 2023-06-09 DIAGNOSIS — Z4682 Encounter for fitting and adjustment of non-vascular catheter: Secondary | ICD-10-CM | POA: Diagnosis not present

## 2023-06-09 DIAGNOSIS — Z452 Encounter for adjustment and management of vascular access device: Secondary | ICD-10-CM | POA: Diagnosis not present

## 2023-06-09 DIAGNOSIS — I700 Atherosclerosis of aorta: Secondary | ICD-10-CM | POA: Diagnosis not present

## 2023-06-09 DIAGNOSIS — R188 Other ascites: Secondary | ICD-10-CM | POA: Diagnosis not present

## 2023-06-09 LAB — BPAM FFP
Blood Product Expiration Date: 202408172359
Blood Product Expiration Date: 202408192359
ISSUE DATE / TIME: 202408141335
Unit Type and Rh: 6200

## 2023-06-09 LAB — POCT I-STAT 7, (LYTES, BLD GAS, ICA,H+H)
Acid-base deficit: 10 mmol/L — ABNORMAL HIGH (ref 0.0–2.0)
Acid-base deficit: 5 mmol/L — ABNORMAL HIGH (ref 0.0–2.0)
Acid-base deficit: 5 mmol/L — ABNORMAL HIGH (ref 0.0–2.0)
Bicarbonate: 18.7 mmol/L — ABNORMAL LOW (ref 20.0–28.0)
Bicarbonate: 21.2 mmol/L (ref 20.0–28.0)
Bicarbonate: 21.4 mmol/L (ref 20.0–28.0)
Calcium, Ion: 0.94 mmol/L — ABNORMAL LOW (ref 1.15–1.40)
Calcium, Ion: 1 mmol/L — ABNORMAL LOW (ref 1.15–1.40)
Calcium, Ion: 1.11 mmol/L — ABNORMAL LOW (ref 1.15–1.40)
HCT: 20 % — ABNORMAL LOW (ref 39.0–52.0)
HCT: 22 % — ABNORMAL LOW (ref 39.0–52.0)
HCT: 28 % — ABNORMAL LOW (ref 39.0–52.0)
Hemoglobin: 6.8 g/dL — CL (ref 13.0–17.0)
Hemoglobin: 7.5 g/dL — ABNORMAL LOW (ref 13.0–17.0)
Hemoglobin: 9.5 g/dL — ABNORMAL LOW (ref 13.0–17.0)
O2 Saturation: 100 %
O2 Saturation: 93 %
O2 Saturation: 99 %
Patient temperature: 98
Potassium: 4.5 mmol/L (ref 3.5–5.1)
Potassium: 4.5 mmol/L (ref 3.5–5.1)
Potassium: 4.6 mmol/L (ref 3.5–5.1)
Sodium: 138 mmol/L (ref 135–145)
Sodium: 140 mmol/L (ref 135–145)
Sodium: 140 mmol/L (ref 135–145)
TCO2: 20 mmol/L — ABNORMAL LOW (ref 22–32)
TCO2: 23 mmol/L (ref 22–32)
TCO2: 23 mmol/L (ref 22–32)
pCO2 arterial: 41.7 mmHg (ref 32–48)
pCO2 arterial: 44.6 mmHg (ref 32–48)
pCO2 arterial: 56.3 mmHg — ABNORMAL HIGH (ref 32–48)
pH, Arterial: 7.13 — CL (ref 7.35–7.45)
pH, Arterial: 7.285 — ABNORMAL LOW (ref 7.35–7.45)
pH, Arterial: 7.317 — ABNORMAL LOW (ref 7.35–7.45)
pO2, Arterial: 155 mmHg — ABNORMAL HIGH (ref 83–108)
pO2, Arterial: 287 mmHg — ABNORMAL HIGH (ref 83–108)
pO2, Arterial: 77 mmHg — ABNORMAL LOW (ref 83–108)

## 2023-06-09 LAB — BASIC METABOLIC PANEL
Anion gap: 10 (ref 5–15)
BUN: 20 mg/dL (ref 8–23)
CO2: 22 mmol/L (ref 22–32)
Calcium: 7.7 mg/dL — ABNORMAL LOW (ref 8.9–10.3)
Chloride: 106 mmol/L (ref 98–111)
Creatinine, Ser: 1.81 mg/dL — ABNORMAL HIGH (ref 0.61–1.24)
GFR, Estimated: 41 mL/min — ABNORMAL LOW (ref >60)
Glucose, Bld: 145 mg/dL — ABNORMAL HIGH (ref 70–99)
Potassium: 4.1 mmol/L (ref 3.5–5.1)
Sodium: 138 mmol/L (ref 135–145)

## 2023-06-09 LAB — HEMOGLOBIN AND HEMATOCRIT, BLOOD
HCT: 20.5 % — ABNORMAL LOW (ref 39.0–52.0)
HCT: 29.8 % — ABNORMAL LOW (ref 39.0–52.0)
Hemoglobin: 6.9 g/dL — CL (ref 13.0–17.0)
Hemoglobin: 10.3 g/dL — ABNORMAL LOW (ref 13.0–17.0)

## 2023-06-09 LAB — PROTIME-INR
INR: 1.2 (ref 0.8–1.2)
Prothrombin Time: 15.4 seconds — ABNORMAL HIGH (ref 11.4–15.2)

## 2023-06-09 LAB — CBC
HCT: 27.4 % — ABNORMAL LOW (ref 39.0–52.0)
Hemoglobin: 9.1 g/dL — ABNORMAL LOW (ref 13.0–17.0)
MCH: 27.5 pg (ref 26.0–34.0)
MCHC: 33.2 g/dL (ref 30.0–36.0)
MCV: 82.8 fL (ref 80.0–100.0)
Platelets: 116 10*3/uL — ABNORMAL LOW (ref 150–400)
RBC: 3.31 MIL/uL — ABNORMAL LOW (ref 4.22–5.81)
RDW: 16.7 % — ABNORMAL HIGH (ref 11.5–15.5)
WBC: 7.9 10*3/uL (ref 4.0–10.5)
nRBC: 0.4 % — ABNORMAL HIGH (ref 0.0–0.2)

## 2023-06-09 LAB — GLUCOSE, CAPILLARY
Glucose-Capillary: 119 mg/dL — ABNORMAL HIGH (ref 70–99)
Glucose-Capillary: 91 mg/dL (ref 70–99)
Glucose-Capillary: 105 mg/dL — ABNORMAL HIGH (ref 70–99)
Glucose-Capillary: 126 mg/dL — ABNORMAL HIGH (ref 70–99)
Glucose-Capillary: 98 mg/dL (ref 70–99)
Glucose-Capillary: 118 mg/dL — ABNORMAL HIGH (ref 70–99)

## 2023-06-09 LAB — TRAUMA TEG PANEL
CFF Max Amplitude: 20.1 mm (ref 15–32)
Citrated Kaolin (R): 5.7 min (ref 4.6–9.1)
Citrated Rapid TEG (MA): 60.8 mm (ref 52–70)
Lysis at 30 Minutes: 0.4 % (ref 0.0–2.6)

## 2023-06-09 LAB — PREPARE RBC (CROSSMATCH)

## 2023-06-09 LAB — APTT: aPTT: 31 seconds (ref 24–36)

## 2023-06-09 LAB — TRIGLYCERIDES: Triglycerides: 95 mg/dL (ref <150)

## 2023-06-09 MED ORDER — MUPIROCIN 2 % EX OINT
1.0000 | TOPICAL_OINTMENT | Freq: Two times a day (BID) | CUTANEOUS | Status: DC
  Filled 2023-06-09: qty 22

## 2023-06-09 MED ORDER — LACTATED RINGERS IV BOLUS
1000.0000 mL | Freq: Once | INTRAVENOUS | Status: AC
  Administered 2023-06-09: 1000 mL via INTRAVENOUS

## 2023-06-09 MED ORDER — PANTOPRAZOLE SODIUM 40 MG IV SOLR
40.0000 mg | INTRAVENOUS | Status: DC
  Administered 2023-06-09 – 2023-06-15 (×7): 40 mg via INTRAVENOUS
  Filled 2023-06-09 (×7): qty 10

## 2023-06-09 MED ORDER — ASPIRIN 81 MG PO CHEW: 81.0000 mg | CHEWABLE_TABLET | Freq: Every day | ORAL | Status: DC

## 2023-06-09 MED ORDER — ENOXAPARIN SODIUM 40 MG/0.4ML IJ SOSY
40.0000 mg | PREFILLED_SYRINGE | Freq: Two times a day (BID) | INTRAMUSCULAR | Status: DC
  Administered 2023-06-10 – 2023-06-14 (×9): 40 mg via SUBCUTANEOUS
  Filled 2023-06-09 (×10): qty 0.4

## 2023-06-09 MED ORDER — CALCIUM GLUCONATE-NACL 1-0.675 GM/50ML-% IV SOLN
1.0000 g | Freq: Once | INTRAVENOUS | Status: AC
  Administered 2023-06-09: 1000 mg via INTRAVENOUS
  Filled 2023-06-09: qty 50

## 2023-06-09 MED ORDER — INSULIN ASPART 100 UNIT/ML IJ SOLN
3.0000 [IU] | INTRAMUSCULAR | Status: DC
  Administered 2023-06-09 – 2023-06-14 (×6): 3 [IU] via SUBCUTANEOUS

## 2023-06-09 MED ORDER — CHLORHEXIDINE GLUCONATE CLOTH 2 % EX PADS
6.0000 | MEDICATED_PAD | Freq: Every day | CUTANEOUS | Status: AC
  Administered 2023-06-10 – 2023-06-12 (×4): 6 via TOPICAL

## 2023-06-09 MED ORDER — MIDAZOLAM HCL 2 MG/2ML IJ SOLN
2.0000 mg | INTRAMUSCULAR | Status: DC | PRN
  Administered 2023-06-09: 2 mg via INTRAVENOUS
  Administered 2023-06-09: 4 mg via INTRAVENOUS
  Administered 2023-06-09: 2 mg via INTRAVENOUS
  Administered 2023-06-09: 4 mg via INTRAVENOUS
  Administered 2023-06-09 – 2023-06-10 (×3): 2 mg via INTRAVENOUS
  Administered 2023-06-10 – 2023-06-11 (×3): 4 mg via INTRAVENOUS
  Filled 2023-06-09: qty 2
  Filled 2023-06-09: qty 4
  Filled 2023-06-09: qty 2
  Filled 2023-06-09: qty 4
  Filled 2023-06-09: qty 2
  Filled 2023-06-09: qty 4
  Filled 2023-06-09 (×3): qty 2
  Filled 2023-06-09: qty 4
  Filled 2023-06-09: qty 2

## 2023-06-09 MED ORDER — ORAL CARE MOUTH RINSE
15.0000 mL | OROMUCOSAL | Status: DC
  Administered 2023-06-09 – 2023-06-15 (×77): 15 mL via OROMUCOSAL

## 2023-06-09 MED ORDER — PROPOFOL 500 MG/50ML IV EMUL
INTRAVENOUS | Status: DC | PRN
  Administered 2023-06-09: 50 ug/kg/min via INTRAVENOUS

## 2023-06-09 MED ORDER — ORAL CARE MOUTH RINSE: 15.0000 mL | OROMUCOSAL | Status: DC | PRN

## 2023-06-09 MED ORDER — SODIUM CHLORIDE 0.9% IV SOLUTION: Freq: Once | INTRAVENOUS | Status: AC

## 2023-06-09 MED ORDER — ASPIRIN 300 MG RE SUPP
150.0000 mg | Freq: Every day | RECTAL | Status: DC
  Administered 2023-06-09 – 2023-06-19 (×11): 150 mg via RECTAL
  Filled 2023-06-09 (×12): qty 1
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
## 2023-06-09 NOTE — Transfer of Care (Signed)
Immediate Anesthesia Transfer of Care Note  Patient: Russell Garcia  Procedure(s) Performed: EXPLORATION LAPAROTOMY PARTIAL COLECTOMY (Abdomen) APPLICATION OF ABDOMINAL WOUND VAC (Abdomen)  Patient Location: ICU  Anesthesia Type:General  Level of Consciousness: sedated, unresponsive, and Patient remains intubated per anesthesia plan  Airway & Oxygen Therapy: Patient remains intubated per anesthesia plan and Patient placed on Ventilator (see vital sign flow sheet for setting)  Post-op Assessment: Report given to RN and Post -op Vital signs reviewed and stable  Post vital signs: Reviewed and stable  Last Vitals:  Vitals Value Taken Time  BP 110/77 06/09/23 0017  Temp    Pulse 81 06/09/23 0026  Resp 7 06/09/23 0026  SpO2 100 % 06/09/23 0026  Vitals shown include unfiled device data.  Last Pain:  Vitals:   06/08/23 2055  TempSrc:   PainSc: 6          Complications: No notable events documented.

## 2023-06-09 NOTE — Progress Notes (Signed)
PT Cancellation Note  Patient Details Name: Russell Garcia MRN: [id number] DOB: 12/08/1955   Cancelled Treatment:    Reason Eval/Treat Not Completed: (P) Patient not medically ready. Pt intubated and sedated with plans to return to OR tomorrow. RN reporting pt will likely not be appropriate tomorrow either considering plans for OR. Therefore, will plan to try to follow-up over the weekend as able.   Raymond Gurney, PT, DPT Acute Rehabilitation Services  Office: [phone]    Jewel Baize 06/09/2023, 8:21 AM

## 2023-06-09 NOTE — Plan of Care (Signed)
  Problem: Elimination: Goal: Will not experience complications related to bowel motility Outcome: Progressing Goal: Will not experience complications related to urinary retention Outcome: Progressing   Problem: Pain Managment: Goal: General experience of comfort will improve Outcome: Progressing   Problem: Skin Integrity: Goal: Risk for impaired skin integrity will decrease Outcome: Progressing   

## 2023-06-09 NOTE — Progress Notes (Signed)
Family educated on visitation policy for private patients. Originally the two visitors were to be patient's two brothers, however they would like to change that to 1 brother and 1 son.  Two visitors will now be: Brother- Russell Garcia- Russell Garcia  Both brother and son are aware they will be his two visitors throughout hospitalization while patient remains private.

## 2023-06-09 NOTE — Procedures (Signed)
Central line  Date/Time: 06/09/2023 12:14 AM  Performed by: Fritzi Mandes, MD Authorized by: Fritzi Mandes, MD   Consent:    Consent obtained:  Emergent situation Pre-procedure details:    Indication(s): central venous access and insufficient peripheral access     Sterile barrier technique: All elements of maximal sterile technique followed     Skin preparation:  Chlorhexidine Procedure details:    Location:  R subclavian   Patient position:  Trendelenburg   Procedural supplies:  Double lumen   Catheter size:  8 Fr   Landmarks identified: yes     Ultrasound guidance: no     Number of attempts:  1   Successful placement: yes   Post-procedure details:    Post-procedure:  Dressing applied and line sutured   Assessment:  Blood return through all ports Comments:     Right subclavian central line placed in the OR as the patient's femoral cordis was not functioning properly and the patient had inadequate peripheral access. CXR will be obtained postoperatively.
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
## 2023-06-10 ENCOUNTER — Other Ambulatory Visit: Payer: Self-pay

## 2023-06-10 ENCOUNTER — Encounter (HOSPITAL_COMMUNITY): Admission: EM | Disposition: A | Discharge status: Rehabilitation Facility | Payer: Self-pay | Source: Home / Self Care

## 2023-06-10 ENCOUNTER — Inpatient Hospital Stay (HOSPITAL_COMMUNITY): Payer: Medicare HMO

## 2023-06-10 DIAGNOSIS — Z6841 Body Mass Index (BMI) 40.0 and over, adult: Secondary | ICD-10-CM | POA: Diagnosis not present

## 2023-06-10 DIAGNOSIS — E669 Obesity, unspecified: Secondary | ICD-10-CM | POA: Diagnosis not present

## 2023-06-10 DIAGNOSIS — K599 Functional intestinal disorder, unspecified: Secondary | ICD-10-CM | POA: Diagnosis not present

## 2023-06-10 DIAGNOSIS — Z9889 Other specified postprocedural states: Secondary | ICD-10-CM | POA: Diagnosis not present

## 2023-06-10 HISTORY — PX: LAPAROTOMY: SHX154

## 2023-06-10 LAB — BPAM PLATELET PHERESIS
Blood Product Expiration Date: 202408172359
ISSUE DATE / TIME: 202408150037
Unit Type and Rh: 5100

## 2023-06-10 LAB — PREPARE FRESH FROZEN PLASMA
Unit division: 0
Unit division: 0

## 2023-06-10 LAB — BPAM FFP
Blood Product Expiration Date: 202408192359
Blood Product Expiration Date: 202408192359
ISSUE DATE / TIME: 202408150038
ISSUE DATE / TIME: 202408150038
Unit Type and Rh: 6200
Unit Type and Rh: 6200

## 2023-06-10 LAB — PREPARE PLATELET PHERESIS: Unit division: 0

## 2023-06-10 LAB — HEMOGLOBIN AND HEMATOCRIT, BLOOD
HCT: 31.9 % — ABNORMAL LOW (ref 39.0–52.0)
HCT: 29.3 % — ABNORMAL LOW (ref 39.0–52.0)
Hemoglobin: 10.7 g/dL — ABNORMAL LOW (ref 13.0–17.0)
Hemoglobin: 9.9 g/dL — ABNORMAL LOW (ref 13.0–17.0)

## 2023-06-10 LAB — SURGICAL PATHOLOGY

## 2023-06-10 LAB — CBC
HCT: 32.3 % — ABNORMAL LOW (ref 39.0–52.0)
HCT: 31.7 % — ABNORMAL LOW (ref 39.0–52.0)
Hemoglobin: 10.7 g/dL — ABNORMAL LOW (ref 13.0–17.0)
Hemoglobin: 10.5 g/dL — ABNORMAL LOW (ref 13.0–17.0)
MCH: 27.5 pg (ref 26.0–34.0)
MCH: 27.7 pg (ref 26.0–34.0)
MCHC: 33.1 g/dL (ref 30.0–36.0)
MCHC: 33.1 g/dL (ref 30.0–36.0)
MCV: 83 fL (ref 80.0–100.0)
MCV: 83.6 fL (ref 80.0–100.0)
Platelets: 99 10*3/uL — ABNORMAL LOW (ref 150–400)
Platelets: 97 10*3/uL — ABNORMAL LOW (ref 150–400)
RBC: 3.89 MIL/uL — ABNORMAL LOW (ref 4.22–5.81)
RBC: 3.79 MIL/uL — ABNORMAL LOW (ref 4.22–5.81)
RDW: 17.1 % — ABNORMAL HIGH (ref 11.5–15.5)
RDW: 16.9 % — ABNORMAL HIGH (ref 11.5–15.5)
WBC: 3.2 10*3/uL — ABNORMAL LOW (ref 4.0–10.5)
WBC: 5.8 10*3/uL (ref 4.0–10.5)
nRBC: 0 % (ref 0.0–0.2)
nRBC: 0 % (ref 0.0–0.2)

## 2023-06-10 LAB — POCT I-STAT 7, (LYTES, BLD GAS, ICA,H+H)
Acid-Base Excess: 0 mmol/L (ref 0.0–2.0)
Bicarbonate: 26.2 mmol/L (ref 20.0–28.0)
Calcium, Ion: 1.11 mmol/L — ABNORMAL LOW (ref 1.15–1.40)
HCT: 28 % — ABNORMAL LOW (ref 39.0–52.0)
Hemoglobin: 9.5 g/dL — ABNORMAL LOW (ref 13.0–17.0)
O2 Saturation: 90 %
Potassium: 4.5 mmol/L (ref 3.5–5.1)
Sodium: 140 mmol/L (ref 135–145)
TCO2: 28 mmol/L (ref 22–32)
pCO2 arterial: 49.5 mmHg — ABNORMAL HIGH (ref 32–48)
pH, Arterial: 7.332 — ABNORMAL LOW (ref 7.35–7.45)
pO2, Arterial: 64 mmHg — ABNORMAL LOW (ref 83–108)

## 2023-06-10 LAB — GLUCOSE, CAPILLARY
Glucose-Capillary: 108 mg/dL — ABNORMAL HIGH (ref 70–99)
Glucose-Capillary: 134 mg/dL — ABNORMAL HIGH (ref 70–99)
Glucose-Capillary: 90 mg/dL (ref 70–99)
Glucose-Capillary: 66 mg/dL — ABNORMAL LOW (ref 70–99)
Glucose-Capillary: 111 mg/dL — ABNORMAL HIGH (ref 70–99)

## 2023-06-10 LAB — PREPARE RBC (CROSSMATCH)

## 2023-06-10 SURGERY — LAPAROTOMY, EXPLORATORY
Anesthesia: General

## 2023-06-10 MED ORDER — HYDROMORPHONE HCL 1 MG/ML IJ SOLN
INTRAMUSCULAR | Status: DC | PRN
  Administered 2023-06-10: .5 mg via INTRAVENOUS

## 2023-06-10 MED ORDER — ROCURONIUM BROMIDE 10 MG/ML (PF) SYRINGE
PREFILLED_SYRINGE | INTRAVENOUS | Status: DC | PRN
  Administered 2023-06-10: 40 mg via INTRAVENOUS
  Administered 2023-06-10 (×2): 30 mg via INTRAVENOUS
  Administered 2023-06-10: 50 mg via INTRAVENOUS

## 2023-06-10 MED ORDER — 0.9 % SODIUM CHLORIDE (POUR BTL) OPTIME
TOPICAL | Status: DC | PRN
  Administered 2023-06-10: 2000 mL

## 2023-06-10 MED ORDER — HYDROMORPHONE HCL 1 MG/ML IJ SOLN
INTRAMUSCULAR | Status: AC
  Filled 2023-06-10: qty 0.5

## 2023-06-10 MED ORDER — FENTANYL CITRATE (PF) 250 MCG/5ML IJ SOLN
INTRAMUSCULAR | Status: AC
  Filled 2023-06-10: qty 5

## 2023-06-10 MED ORDER — PROPOFOL 500 MG/50ML IV EMUL
INTRAVENOUS | Status: DC | PRN
  Administered 2023-06-10: 50 ug/kg/min via INTRAVENOUS

## 2023-06-10 MED ORDER — ONDANSETRON HCL 4 MG/2ML IJ SOLN
INTRAMUSCULAR | Status: AC
  Filled 2023-06-10: qty 2

## 2023-06-10 MED ORDER — PROPOFOL 10 MG/ML IV BOLUS
INTRAVENOUS | Status: AC
  Filled 2023-06-10: qty 20

## 2023-06-10 MED ORDER — PIPERACILLIN-TAZOBACTAM 3.375 G IVPB
3.3750 g | Freq: Once | INTRAVENOUS | Status: AC
  Administered 2023-06-10: 3.375 g via INTRAVENOUS
  Filled 2023-06-10: qty 50

## 2023-06-10 MED ORDER — ROCURONIUM BROMIDE 10 MG/ML (PF) SYRINGE
PREFILLED_SYRINGE | INTRAVENOUS | Status: AC
  Filled 2023-06-10: qty 10

## 2023-06-10 MED ORDER — PROPOFOL 1000 MG/100ML IV EMUL
INTRAVENOUS | Status: AC
  Filled 2023-06-10: qty 100

## 2023-06-10 MED ORDER — DEXTROSE 50 % IV SOLN
INTRAVENOUS | Status: AC
  Administered 2023-06-10: 25 mL
  Filled 2023-06-10: qty 50

## 2023-06-10 MED ORDER — ALBUMIN HUMAN 5 % IV SOLN: INTRAVENOUS | Status: DC | PRN

## 2023-06-10 MED ORDER — HEMOSTATIC AGENTS (NO CHARGE) OPTIME
TOPICAL | Status: DC | PRN
  Administered 2023-06-10: 1 via TOPICAL

## 2023-06-10 MED ORDER — PIPERACILLIN-TAZOBACTAM 3.375 G IVPB
3.3750 g | Freq: Three times a day (TID) | INTRAVENOUS | Status: AC
  Administered 2023-06-10 – 2023-06-14 (×11): 3.375 g via INTRAVENOUS
  Filled 2023-06-10 (×11): qty 50

## 2023-06-10 MED ORDER — FENTANYL CITRATE (PF) 250 MCG/5ML IJ SOLN
INTRAMUSCULAR | Status: DC | PRN
  Administered 2023-06-10: 50 ug via INTRAVENOUS
  Administered 2023-06-10 (×2): 100 ug via INTRAVENOUS

## 2023-06-10 SURGICAL SUPPLY — 49 items
APL PRP STRL LF DISP 70% ISPRP (MISCELLANEOUS) ×1
BIOPATCH RED 1 DISK 7.0 (GAUZE/BANDAGES/DRESSINGS) IMPLANT
BLADE CLIPPER SURG (BLADE) IMPLANT
CANISTER SUCT 3000ML PPV (MISCELLANEOUS) ×2 IMPLANT
CANISTER WOUND CARE 500ML ATS (WOUND CARE) IMPLANT
CHLORAPREP W/TINT 26 (MISCELLANEOUS) ×2 IMPLANT
COVER SURGICAL LIGHT HANDLE (MISCELLANEOUS) ×2 IMPLANT
DRAPE DERMATAC (DRAPES) IMPLANT
DRAPE LAPAROSCOPIC ABDOMINAL (DRAPES) ×2 IMPLANT
DRAPE UNIVERSAL (DRAPES) ×2 IMPLANT
DRAPE WARM FLUID 44X44 (DRAPES) ×2 IMPLANT
DRSG OPSITE POSTOP 4X10 (GAUZE/BANDAGES/DRESSINGS) IMPLANT
DRSG OPSITE POSTOP 4X8 (GAUZE/BANDAGES/DRESSINGS) IMPLANT
DRSG TEGADERM 4X4.5 CHG (GAUZE/BANDAGES/DRESSINGS) IMPLANT
DRSG VAC GRANUFOAM LG (GAUZE/BANDAGES/DRESSINGS) IMPLANT
ELECT BLADE 6.5 EXT (BLADE) IMPLANT
ELECT CAUTERY BLADE 6.4 (BLADE) ×2 IMPLANT
ELECT REM PT RETURN 9FT ADLT (ELECTROSURGICAL) ×1
ELECTRODE REM PT RTRN 9FT ADLT (ELECTROSURGICAL) ×2 IMPLANT
GLOVE BIO SURGEON STRL SZ 6.5 (GLOVE) ×2 IMPLANT
GLOVE BIOGEL PI IND STRL 6 (GLOVE) ×2 IMPLANT
GOWN STRL REUS W/ TWL LRG LVL3 (GOWN DISPOSABLE) ×4 IMPLANT
GOWN STRL REUS W/TWL LRG LVL3 (GOWN DISPOSABLE) ×2
HANDLE SUCTION POOLE (INSTRUMENTS) ×2 IMPLANT
KIT BASIN OR (CUSTOM PROCEDURE TRAY) ×2 IMPLANT
KIT TURNOVER KIT B (KITS) ×2 IMPLANT
LIGASURE IMPACT 36 18CM CVD LR (INSTRUMENTS) IMPLANT
NS IRRIG 1000ML POUR BTL (IV SOLUTION) ×4 IMPLANT
PACK GENERAL/GYN (CUSTOM PROCEDURE TRAY) ×2 IMPLANT
PAD ARMBOARD 7.5X6 YLW CONV (MISCELLANEOUS) ×2 IMPLANT
PENCIL SMOKE EVACUATOR (MISCELLANEOUS) ×2 IMPLANT
RELOAD PROXIMATE 75MM BLUE (ENDOMECHANICALS) ×3
RELOAD STAPLE 75 3.8 BLU REG (ENDOMECHANICALS) IMPLANT
SPONGE T-LAP 18X18 ~~LOC~~+RFID (SPONGE) IMPLANT
STAPLER PROXIMATE 75MM BLUE (STAPLE) IMPLANT
STAPLER VISISTAT 35W (STAPLE) ×2 IMPLANT
SUCTION POOLE HANDLE (INSTRUMENTS) ×1
SUT ETHILON 2 0 FS 18 (SUTURE) IMPLANT
SUT PDS AB 1 TP1 54 (SUTURE) IMPLANT
SUT PDS AB 1 TP1 96 (SUTURE) IMPLANT
SUT SILK 2 0 SH CR/8 (SUTURE) ×2 IMPLANT
SUT SILK 2 0 TIES 10X30 (SUTURE) ×2 IMPLANT
SUT SILK 3 0 SH CR/8 (SUTURE) ×2 IMPLANT
SUT SILK 3 0 TIES 10X30 (SUTURE) ×2 IMPLANT
SUT SILK 3 0SH CR/8 30 (SUTURE) IMPLANT
SUT VIC AB 3-0 SH 18 (SUTURE) IMPLANT
TOWEL GREEN STERILE (TOWEL DISPOSABLE) ×2 IMPLANT
TRAY FOLEY MTR SLVR 16FR STAT (SET/KITS/TRAYS/PACK) IMPLANT
YANKAUER SUCT BULB TIP NO VENT (SUCTIONS) IMPLANT

## 2023-06-10 NOTE — Consult Note (Signed)
WOC consulted for NPWT midline dressing change beginning Monday 8/19; supplies ordered   [facility], CNS, [employer] [phone]
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
## 2023-06-10 NOTE — Anesthesia Postprocedure Evaluation (Signed)
Anesthesia Post Note  Patient: Russell Garcia  Procedure(s) Performed: EXPLORATION LAPAROTOMY     Patient location during evaluation: ICU Anesthesia Type: General Level of consciousness: patient remains intubated per anesthesia plan Pain management: pain level controlled Vital Signs Assessment: post-procedure vital signs reviewed and stable Respiratory status: respiratory function stable and patient on ventilator - see flowsheet for VS Cardiovascular status: blood pressure returned to baseline and stable Postop Assessment: no apparent nausea or vomiting Anesthetic complications: no  No notable events documented.  Last Vitals:  Vitals:   06/10/23 1000 06/10/23 1015  BP: 105/70   Pulse: 100 (!) 112  Resp: 15 (!) 8  Temp: 37 C 37 C  SpO2: 100% 100%    Last Pain:  Vitals:   06/09/23 2015  TempSrc: Esophageal  PainSc:                  Lannie Fields

## 2023-06-10 NOTE — Transfer of Care (Signed)
Immediate Anesthesia Transfer of Care Note  Patient: Edem J Autrey  Procedure(s) Performed: EXPLORATION LAPAROTOMY  Patient Location: ICU  Anesthesia Type:General  Level of Consciousness: sedated  Airway & Oxygen Therapy: Patient remains intubated per anesthesia plan  Post-op Assessment: Report given to RN and Post -op Vital signs reviewed and stable  Post vital signs: Reviewed and stable  Last Vitals:  Vitals Value Taken Time  BP    Temp 35.8 C 06/10/23 1318  Pulse 117 06/10/23 1314  Resp    SpO2 100 % 06/10/23 1315  Vitals shown include unfiled device data.  Last Pain:  Vitals:   06/09/23 2015  TempSrc: Esophageal  PainSc:          Complications: No notable events documented.
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
## 2023-06-10 NOTE — Progress Notes (Signed)
OT Cancellation Note  Patient Details Name: Russell Garcia MRN: [id number] DOB: October 20, 1956   Cancelled Treatment:    Reason Eval/Treat Not Completed: Patient not medically ready- OR at 10am per RN.  OT to hold and see when appropriate/able.   [name], OT Acute Rehabilitation Services Office [phone]   [name] 06/10/2023, 9:43 AM
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
## 2023-06-10 NOTE — Progress Notes (Signed)
Pt in OR at this time. No vent check performed

## 2023-06-11 ENCOUNTER — Inpatient Hospital Stay (HOSPITAL_COMMUNITY): Payer: Medicare HMO

## 2023-06-11 DIAGNOSIS — Z452 Encounter for adjustment and management of vascular access device: Secondary | ICD-10-CM | POA: Diagnosis not present

## 2023-06-11 DIAGNOSIS — Z4682 Encounter for fitting and adjustment of non-vascular catheter: Secondary | ICD-10-CM | POA: Diagnosis not present

## 2023-06-11 DIAGNOSIS — J9811 Atelectasis: Secondary | ICD-10-CM | POA: Diagnosis not present

## 2023-06-11 DIAGNOSIS — J9690 Respiratory failure, unspecified, unspecified whether with hypoxia or hypercapnia: Secondary | ICD-10-CM | POA: Diagnosis not present

## 2023-06-11 LAB — CBC
HCT: 29 % — ABNORMAL LOW (ref 39.0–52.0)
HCT: 27 % — ABNORMAL LOW (ref 39.0–52.0)
HCT: 25.2 % — ABNORMAL LOW (ref 39.0–52.0)
Hemoglobin: 9.6 g/dL — ABNORMAL LOW (ref 13.0–17.0)
Hemoglobin: 9 g/dL — ABNORMAL LOW (ref 13.0–17.0)
Hemoglobin: 8.3 g/dL — ABNORMAL LOW (ref 13.0–17.0)
MCH: 27.8 pg (ref 26.0–34.0)
MCH: 27.9 pg (ref 26.0–34.0)
MCH: 27.7 pg (ref 26.0–34.0)
MCHC: 33.1 g/dL (ref 30.0–36.0)
MCHC: 33.3 g/dL (ref 30.0–36.0)
MCHC: 32.9 g/dL (ref 30.0–36.0)
MCV: 84.1 fL (ref 80.0–100.0)
MCV: 83.6 fL (ref 80.0–100.0)
MCV: 84 fL (ref 80.0–100.0)
Platelets: 98 10*3/uL — ABNORMAL LOW (ref 150–400)
Platelets: 108 10*3/uL — ABNORMAL LOW (ref 150–400)
Platelets: 109 10*3/uL — ABNORMAL LOW (ref 150–400)
RBC: 3.45 MIL/uL — ABNORMAL LOW (ref 4.22–5.81)
RBC: 3.23 MIL/uL — ABNORMAL LOW (ref 4.22–5.81)
RBC: 3 MIL/uL — ABNORMAL LOW (ref 4.22–5.81)
RDW: 17 % — ABNORMAL HIGH (ref 11.5–15.5)
RDW: 16.8 % — ABNORMAL HIGH (ref 11.5–15.5)
RDW: 16.7 % — ABNORMAL HIGH (ref 11.5–15.5)
WBC: 7.8 10*3/uL (ref 4.0–10.5)
WBC: 9.2 10*3/uL (ref 4.0–10.5)
WBC: 8.9 10*3/uL (ref 4.0–10.5)
nRBC: 0.5 % — ABNORMAL HIGH (ref 0.0–0.2)
nRBC: 0.3 % — ABNORMAL HIGH (ref 0.0–0.2)
nRBC: 0.2 % (ref 0.0–0.2)

## 2023-06-11 LAB — BASIC METABOLIC PANEL
Anion gap: 11 (ref 5–15)
BUN: 29 mg/dL — ABNORMAL HIGH (ref 8–23)
CO2: 25 mmol/L (ref 22–32)
Calcium: 7.3 mg/dL — ABNORMAL LOW (ref 8.9–10.3)
Chloride: 103 mmol/L (ref 98–111)
Creatinine, Ser: 1.97 mg/dL — ABNORMAL HIGH (ref 0.61–1.24)
GFR, Estimated: 37 mL/min — ABNORMAL LOW (ref >60)
Glucose, Bld: 142 mg/dL — ABNORMAL HIGH (ref 70–99)
Potassium: 4.1 mmol/L (ref 3.5–5.1)
Sodium: 139 mmol/L (ref 135–145)

## 2023-06-11 LAB — GLUCOSE, CAPILLARY
Glucose-Capillary: 126 mg/dL — ABNORMAL HIGH (ref 70–99)
Glucose-Capillary: 150 mg/dL — ABNORMAL HIGH (ref 70–99)
Glucose-Capillary: 137 mg/dL — ABNORMAL HIGH (ref 70–99)
Glucose-Capillary: 113 mg/dL — ABNORMAL HIGH (ref 70–99)
Glucose-Capillary: 110 mg/dL — ABNORMAL HIGH (ref 70–99)
Glucose-Capillary: 95 mg/dL (ref 70–99)

## 2023-06-11 MED ORDER — DEXMEDETOMIDINE HCL IN NACL 400 MCG/100ML IV SOLN
0.0000 ug/kg/h | INTRAVENOUS | Status: DC
  Administered 2023-06-11 (×3): 0.4 ug/kg/h via INTRAVENOUS
  Administered 2023-06-12: 0.7 ug/kg/h via INTRAVENOUS
  Administered 2023-06-12: 0.5 ug/kg/h via INTRAVENOUS
  Administered 2023-06-12: 0.7 ug/kg/h via INTRAVENOUS
  Administered 2023-06-12: 0.5 ug/kg/h via INTRAVENOUS
  Administered 2023-06-13: 0.6 ug/kg/h via INTRAVENOUS
  Administered 2023-06-13 (×2): 0.8 ug/kg/h via INTRAVENOUS
  Administered 2023-06-13: 0.4 ug/kg/h via INTRAVENOUS
  Administered 2023-06-13: 0.7 ug/kg/h via INTRAVENOUS
  Administered 2023-06-13: 0.9 ug/kg/h via INTRAVENOUS
  Administered 2023-06-14: 0.8 ug/kg/h via INTRAVENOUS
  Administered 2023-06-14: 0.9 ug/kg/h via INTRAVENOUS
  Administered 2023-06-14 (×2): 0.8 ug/kg/h via INTRAVENOUS
  Administered 2023-06-14: 1 ug/kg/h via INTRAVENOUS
  Administered 2023-06-14: 0.8 ug/kg/h via INTRAVENOUS
  Administered 2023-06-14: 0.7 ug/kg/h via INTRAVENOUS
  Administered 2023-06-15: 0.8 ug/kg/h via INTRAVENOUS
  Filled 2023-06-11 (×6): qty 100
  Filled 2023-06-11 (×2): qty 200
  Filled 2023-06-11: qty 100
  Filled 2023-06-11 (×2): qty 200
  Filled 2023-06-11 (×9): qty 100

## 2023-06-11 MED ORDER — FUROSEMIDE 10 MG/ML IJ SOLN
20.0000 mg | Freq: Once | INTRAMUSCULAR | Status: AC
  Administered 2023-06-11: 20 mg via INTRAVENOUS
  Filled 2023-06-11: qty 2

## 2023-06-11 MED ORDER — CALCIUM GLUCONATE-NACL 2-0.675 GM/100ML-% IV SOLN
2.0000 g | Freq: Once | INTRAVENOUS | Status: AC
  Administered 2023-06-11: 2000 mg via INTRAVENOUS
  Filled 2023-06-11: qty 100

## 2023-06-11 MED ORDER — MIDAZOLAM HCL 2 MG/2ML IJ SOLN
2.0000 mg | INTRAMUSCULAR | Status: DC | PRN
  Administered 2023-06-12 – 2023-06-14 (×7): 2 mg via INTRAVENOUS
  Filled 2023-06-11 (×8): qty 2
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
## 2023-06-11 NOTE — Plan of Care (Signed)
  Problem: Elimination: Goal: Will not experience complications related to urinary retention Outcome: Progressing   Problem: Nutrition: Goal: Adequate nutrition will be maintained Outcome: Not Progressing   

## 2023-06-11 NOTE — Progress Notes (Signed)
PT Cancellation Note  Patient Details Name: Russell Garcia MRN: [id number] DOB: Mar 15, 1956   Cancelled Treatment:    Reason Eval/Treat Not Completed: (P) Patient not medically ready. Coordinated with RN. RN reporting she would notify PT if pt becomes appropriate tomorrow, otherwise will plan to follow-up with pt on Monday.   Raymond Gurney, PT, DPT Acute Rehabilitation Services  Office: [phone]    Jewel Baize 06/11/2023, 12:57 PM

## 2023-06-12 ENCOUNTER — Encounter (HOSPITAL_COMMUNITY): Payer: Self-pay | Admitting: Surgery

## 2023-06-12 ENCOUNTER — Inpatient Hospital Stay (HOSPITAL_COMMUNITY): Payer: Medicare HMO

## 2023-06-12 DIAGNOSIS — J984 Other disorders of lung: Secondary | ICD-10-CM | POA: Diagnosis not present

## 2023-06-12 DIAGNOSIS — J9811 Atelectasis: Secondary | ICD-10-CM | POA: Diagnosis not present

## 2023-06-12 DIAGNOSIS — Z4682 Encounter for fitting and adjustment of non-vascular catheter: Secondary | ICD-10-CM | POA: Diagnosis not present

## 2023-06-12 DIAGNOSIS — J9690 Respiratory failure, unspecified, unspecified whether with hypoxia or hypercapnia: Secondary | ICD-10-CM | POA: Diagnosis not present

## 2023-06-12 LAB — TYPE AND SCREEN
Unit division: 0
Unit division: 0

## 2023-06-12 LAB — BASIC METABOLIC PANEL
Anion gap: 10 (ref 5–15)
BUN: 25 mg/dL — ABNORMAL HIGH (ref 8–23)
CO2: 25 mmol/L (ref 22–32)
Calcium: 7.9 mg/dL — ABNORMAL LOW (ref 8.9–10.3)
Chloride: 105 mmol/L (ref 98–111)
Creatinine, Ser: 1.65 mg/dL — ABNORMAL HIGH (ref 0.61–1.24)
GFR, Estimated: 46 mL/min — ABNORMAL LOW (ref >60)
Glucose, Bld: 118 mg/dL — ABNORMAL HIGH (ref 70–99)
Potassium: 3.7 mmol/L (ref 3.5–5.1)
Sodium: 140 mmol/L (ref 135–145)

## 2023-06-12 LAB — BPAM RBC
Blood Product Expiration Date: 202408282359
Blood Product Expiration Date: 202409032359
Unit Type and Rh: 6200
Unit Type and Rh: 6200

## 2023-06-12 LAB — TRIGLYCERIDES: Triglycerides: 282 mg/dL — ABNORMAL HIGH (ref <150)

## 2023-06-12 LAB — GLUCOSE, CAPILLARY
Glucose-Capillary: 114 mg/dL — ABNORMAL HIGH (ref 70–99)
Glucose-Capillary: 111 mg/dL — ABNORMAL HIGH (ref 70–99)
Glucose-Capillary: 114 mg/dL — ABNORMAL HIGH (ref 70–99)
Glucose-Capillary: 106 mg/dL — ABNORMAL HIGH (ref 70–99)
Glucose-Capillary: 101 mg/dL — ABNORMAL HIGH (ref 70–99)
Glucose-Capillary: 111 mg/dL — ABNORMAL HIGH (ref 70–99)

## 2023-06-12 MED ORDER — POTASSIUM CHLORIDE 10 MEQ/50ML IV SOLN
10.0000 meq | INTRAVENOUS | Status: AC
  Administered 2023-06-12 (×4): 10 meq via INTRAVENOUS
  Filled 2023-06-12 (×4): qty 50

## 2023-06-12 MED ORDER — CHLORHEXIDINE GLUCONATE CLOTH 2 % EX PADS
6.0000 | MEDICATED_PAD | Freq: Every day | CUTANEOUS | Status: DC
  Administered 2023-06-13 – 2023-07-10 (×32): 6 via TOPICAL

## 2023-06-12 MED ORDER — POTASSIUM CHLORIDE 10 MEQ/100ML IV SOLN: 10.0000 meq | INTRAVENOUS | Status: DC

## 2023-06-12 MED ORDER — FUROSEMIDE 10 MG/ML IJ SOLN
40.0000 mg | Freq: Once | INTRAMUSCULAR | Status: AC
  Administered 2023-06-12: 40 mg via INTRAVENOUS
  Filled 2023-06-12: qty 4
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
## 2023-06-13 ENCOUNTER — Encounter (HOSPITAL_COMMUNITY): Payer: Self-pay | Admitting: Surgery

## 2023-06-13 LAB — BASIC METABOLIC PANEL
Anion gap: 11 (ref 5–15)
BUN: 24 mg/dL — ABNORMAL HIGH (ref 8–23)
CO2: 24 mmol/L (ref 22–32)
Calcium: 7.9 mg/dL — ABNORMAL LOW (ref 8.9–10.3)
Chloride: 106 mmol/L (ref 98–111)
Creatinine, Ser: 1.61 mg/dL — ABNORMAL HIGH (ref 0.61–1.24)
GFR, Estimated: 47 mL/min — ABNORMAL LOW (ref >60)
Glucose, Bld: 113 mg/dL — ABNORMAL HIGH (ref 70–99)
Potassium: 3.5 mmol/L (ref 3.5–5.1)
Sodium: 141 mmol/L (ref 135–145)

## 2023-06-13 LAB — CBC
HCT: 24.4 % — ABNORMAL LOW (ref 39.0–52.0)
Hemoglobin: 8 g/dL — ABNORMAL LOW (ref 13.0–17.0)
MCH: 27.7 pg (ref 26.0–34.0)
MCHC: 32.8 g/dL (ref 30.0–36.0)
MCV: 84.4 fL (ref 80.0–100.0)
Platelets: 126 10*3/uL — ABNORMAL LOW (ref 150–400)
RBC: 2.89 MIL/uL — ABNORMAL LOW (ref 4.22–5.81)
RDW: 16.8 % — ABNORMAL HIGH (ref 11.5–15.5)
WBC: 8.8 10*3/uL (ref 4.0–10.5)
nRBC: 0.5 % — ABNORMAL HIGH (ref 0.0–0.2)

## 2023-06-13 LAB — GLUCOSE, CAPILLARY
Glucose-Capillary: 119 mg/dL — ABNORMAL HIGH (ref 70–99)
Glucose-Capillary: 118 mg/dL — ABNORMAL HIGH (ref 70–99)
Glucose-Capillary: 98 mg/dL (ref 70–99)
Glucose-Capillary: 102 mg/dL — ABNORMAL HIGH (ref 70–99)
Glucose-Capillary: 103 mg/dL — ABNORMAL HIGH (ref 70–99)
Glucose-Capillary: 111 mg/dL — ABNORMAL HIGH (ref 70–99)

## 2023-06-13 MED ORDER — ACETAMINOPHEN 325 MG PO TABS
650.0000 mg | ORAL_TABLET | Freq: Four times a day (QID) | ORAL | Status: DC
  Administered 2023-06-13 – 2023-06-15 (×8): 650 mg
  Filled 2023-06-13 (×8): qty 2

## 2023-06-13 MED ORDER — QUETIAPINE FUMARATE 25 MG PO TABS
50.0000 mg | ORAL_TABLET | Freq: Two times a day (BID) | ORAL | Status: DC
  Administered 2023-06-13 (×2): 50 mg
  Filled 2023-06-13 (×2): qty 2

## 2023-06-13 MED ORDER — LISINOPRIL 20 MG PO TABS
20.0000 mg | ORAL_TABLET | Freq: Every day | ORAL | Status: DC
  Administered 2023-06-13 – 2023-06-14 (×2): 20 mg
  Filled 2023-06-13 (×2): qty 1

## 2023-06-13 MED ORDER — SPIRONOLACTONE 25 MG PO TABS
25.0000 mg | ORAL_TABLET | Freq: Every day | ORAL | Status: DC
  Administered 2023-06-13 – 2023-06-14 (×2): 25 mg
  Filled 2023-06-13 (×2): qty 1

## 2023-06-13 MED ORDER — HYDRALAZINE HCL 20 MG/ML IJ SOLN
10.0000 mg | INTRAMUSCULAR | Status: DC | PRN
  Administered 2023-06-13 – 2023-07-08 (×21): 10 mg via INTRAVENOUS
  Filled 2023-06-13 (×22): qty 1

## 2023-06-13 MED ORDER — OXYCODONE HCL 5 MG PO TABS
10.0000 mg | ORAL_TABLET | Freq: Three times a day (TID) | ORAL | Status: DC
  Administered 2023-06-13 – 2023-06-15 (×6): 10 mg
  Filled 2023-06-13 (×7): qty 2

## 2023-06-13 NOTE — Anesthesia Postprocedure Evaluation (Signed)
Anesthesia Post Note  Patient: Russell Garcia  Procedure(s) Performed: EXPLORATION LAPAROTOMY PARTIAL COLECTOMY (Abdomen) APPLICATION OF ABDOMINAL WOUND VAC (Abdomen)     Patient location during evaluation: SICU Anesthesia Type: General Level of consciousness: sedated Pain management: pain level controlled Vital Signs Assessment: post-procedure vital signs reviewed and stable Respiratory status: patient remains intubated per anesthesia plan Cardiovascular status: stable Postop Assessment: no apparent nausea or vomiting Anesthetic complications: no   No notable events documented.  Last Vitals:  Vitals:   06/13/23 0600 06/13/23 0700  BP: (!) 143/86 (!) 146/88  Pulse: 69 69  Resp: 15 15  Temp: 37.9 C 37.9 C  SpO2: 96% 97%    Last Pain:  Vitals:   06/13/23 0700  TempSrc: Esophageal  PainSc:                  Myisha Pickerel S

## 2023-06-13 NOTE — Progress Notes (Signed)
Patient ID: Russell Garcia, male   DOB: 07/12/1956, 67 y.o.   MRN: [id number] I spoke with his son, [name], on the phone for a clinical update.  Violeta Gelinas, MD, MPH, FACS Please use [email or web address] to contact on call provider

## 2023-06-13 NOTE — Progress Notes (Addendum)
  Progress Note    06/13/2023 8:03 AM 3 Days Post-Op  Subjective:  intubated and sedated   Vitals:   06/13/23 0600 06/13/23 0700  BP: (!) 143/86 (!) 146/88  Pulse: 69 69  Resp: 15 15  Temp: 100.2 F (37.9 C) 100.2 F (37.9 C)  SpO2: 96% 97%   Physical Exam: Lungs:  mechanical ventilation Incisions:  R arm and L leg incisions c/d/i Extremities:  palpable R radial pulse Neurologic: sedated  CBC    Component Value Date/Time   WBC 8.8 06/13/2023 0439   RBC 2.89 (L) 06/13/2023 0439   HGB 8.0 (L) 06/13/2023 0439   HCT 24.4 (L) 06/13/2023 0439   PLT 126 (L) 06/13/2023 0439   MCV 84.4 06/13/2023 0439   MCH 27.7 06/13/2023 0439   MCHC 32.8 06/13/2023 0439   RDW 16.8 (H) 06/13/2023 0439   LYMPHSABS 1.6 06/08/2023 1735   MONOABS 1.7 (H) 06/08/2023 1735   EOSABS 0.0 06/08/2023 1735   BASOSABS 0.0 06/08/2023 1735    BMET    Component Value Date/Time   NA 141 06/13/2023 0439   K 3.5 06/13/2023 0439   CL 106 06/13/2023 0439   CO2 24 06/13/2023 0439   GLUCOSE 113 (H) 06/13/2023 0439   BUN 24 (H) 06/13/2023 0439   CREATININE 1.61 (H) 06/13/2023 0439   CALCIUM 7.9 (L) 06/13/2023 0439   GFRNONAA 47 (L) 06/13/2023 0439    INR    Component Value Date/Time   INR 1.2 06/09/2023 1350     Intake/Output Summary (Last 24 hours) at 06/13/2023 0803 Last data filed at 06/13/2023 0700 Gross per 24 hour  Intake 2289.17 ml  Output 3880 ml  Net -1590.83 ml     Assessment/Plan:  67 y.o. male is s/p R brachial bypass 3 Days Post-Op   R hand well perfused with palpable radial pulse R arm incision without hematoma; drain output increasing in past 24h?; continue another day L leg saphenectomy incision is healing well R arm dry dressing as needed Continue daily aspirin  Emilie Rutter, PA-C [facility] [phone] 06/13/2023 8:03 AM  VASCULAR STAFF ADDENDUM: I have independently interviewed and examined the patient. I agree with the above.  Drain  pulled, palpable pulse   Fara Olden, MD [facility] Phone Number: [phone] 06/13/2023 10:50 AM

## 2023-06-13 NOTE — Consult Note (Signed)
WOC Nurse Consult Note: Reason for Consult:midline abdominal incision, NPWT in place  will change dressing.  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  Patient is sedated and vented at this time.  Wound type: surgical Pressure Injury POA: NA Measurement: 20 cm x 6 cm x 5.5 cm  Wound bed: Beefy red, blue sutures noted .  Photo in chart Drainage (amount, consistency, odor) minimal serosanguinous  no odor Periwound: intact  JP drains in left and right abdomen Dressing procedure/placement/frequency: cleanse wound with NS and pat dry.  Piece of barrier ring in umbilicus and at distal end of wound to promote seal. 1 piece black foam into wound bed Covered with 2 pieces drape.   Seal immediately achieved at 125 mmHg. Change Mon/Wed/Fri.   Will not follow at this time.  Please re-consult if needed.  Mike Gip MSN, RN, FNP-BC CWON Wound, Ostomy, Continence Nurse Outpatient [facility] [phone] Pager [phone]
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
## 2023-06-13 NOTE — Progress Notes (Signed)
OT Cancellation Note  Patient Details Name: Russell Garcia MRN: [id number] DOB: May 09, 1956   Cancelled Treatment:    Reason Eval/Treat Not Completed: Patient not medically ready Patient unable to wean sedation earlier, requiring return to sedation due to impulsivity and grabbing at vent. OT will complete evaluation when more medically appropriate and participatory.   Pollyann Glen E. Tauna Macfarlane, OTR/L Acute Rehabilitation Services [phone]   Russell Garcia 06/13/2023, 3:03 PM
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
## 2023-06-13 NOTE — Progress Notes (Signed)
PT Cancellation Note  Patient Details Name: Russell Garcia MRN: [id number] DOB: November 05, 1955   Cancelled Treatment:    Reason Eval/Treat Not Completed: Patient not medically ready (Remains vented/sedated, unable to wean sedation earlier today.)   Renaldo Fiddler PT, DPT Acute Rehabilitation Services Office [phone]  06/13/23 3:00 PM
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
## 2023-06-13 NOTE — Progress Notes (Signed)
Pt's BP began to be above his parameters of < 180 SBP per his arterial line. Pt sustained this hypertension and I notified Schiff, MD. Pt's exam remained the same with no new changes. New order for PRN Hydralazine ordered. Medication given, will continue to monitor and notify of any further changes.

## 2023-06-14 ENCOUNTER — Inpatient Hospital Stay (HOSPITAL_COMMUNITY): Payer: Medicare HMO

## 2023-06-14 DIAGNOSIS — J9621 Acute and chronic respiratory failure with hypoxia: Secondary | ICD-10-CM

## 2023-06-14 DIAGNOSIS — J984 Other disorders of lung: Secondary | ICD-10-CM | POA: Diagnosis not present

## 2023-06-14 DIAGNOSIS — Z452 Encounter for adjustment and management of vascular access device: Secondary | ICD-10-CM | POA: Diagnosis not present

## 2023-06-14 DIAGNOSIS — Z4682 Encounter for fitting and adjustment of non-vascular catheter: Secondary | ICD-10-CM | POA: Diagnosis not present

## 2023-06-14 DIAGNOSIS — R918 Other nonspecific abnormal finding of lung field: Secondary | ICD-10-CM | POA: Diagnosis not present

## 2023-06-14 LAB — BASIC METABOLIC PANEL
Anion gap: 8 (ref 5–15)
BUN: 23 mg/dL (ref 8–23)
CO2: 25 mmol/L (ref 22–32)
Calcium: 7.9 mg/dL — ABNORMAL LOW (ref 8.9–10.3)
Chloride: 109 mmol/L (ref 98–111)
Creatinine, Ser: 1.46 mg/dL — ABNORMAL HIGH (ref 0.61–1.24)
GFR, Estimated: 53 mL/min — ABNORMAL LOW (ref >60)
Glucose, Bld: 108 mg/dL — ABNORMAL HIGH (ref 70–99)
Potassium: 3.4 mmol/L — ABNORMAL LOW (ref 3.5–5.1)
Sodium: 142 mmol/L (ref 135–145)

## 2023-06-14 LAB — CBC
HCT: 24.5 % — ABNORMAL LOW (ref 39.0–52.0)
Hemoglobin: 7.8 g/dL — ABNORMAL LOW (ref 13.0–17.0)
MCH: 27.8 pg (ref 26.0–34.0)
MCHC: 31.8 g/dL (ref 30.0–36.0)
MCV: 87.2 fL (ref 80.0–100.0)
Platelets: 171 10*3/uL (ref 150–400)
RBC: 2.81 MIL/uL — ABNORMAL LOW (ref 4.22–5.81)
RDW: 17.3 % — ABNORMAL HIGH (ref 11.5–15.5)
WBC: 8.8 10*3/uL (ref 4.0–10.5)
nRBC: 0.2 % (ref 0.0–0.2)

## 2023-06-14 LAB — GLUCOSE, CAPILLARY
Glucose-Capillary: 120 mg/dL — ABNORMAL HIGH (ref 70–99)
Glucose-Capillary: 95 mg/dL (ref 70–99)
Glucose-Capillary: 107 mg/dL — ABNORMAL HIGH (ref 70–99)
Glucose-Capillary: 116 mg/dL — ABNORMAL HIGH (ref 70–99)
Glucose-Capillary: 126 mg/dL — ABNORMAL HIGH (ref 70–99)
Glucose-Capillary: 124 mg/dL — ABNORMAL HIGH (ref 70–99)

## 2023-06-14 LAB — PHOSPHORUS: Phosphorus: 1.8 mg/dL — ABNORMAL LOW (ref 2.5–4.6)

## 2023-06-14 LAB — MAGNESIUM: Magnesium: 2.2 mg/dL (ref 1.7–2.4)

## 2023-06-14 MED ORDER — ETOMIDATE 2 MG/ML IV SOLN: 20.0000 mg | Freq: Once | INTRAVENOUS | Status: AC

## 2023-06-14 MED ORDER — ROCURONIUM BROMIDE 50 MG/5ML IV SOLN: 50.0000 mg | Freq: Once | INTRAVENOUS | Status: AC

## 2023-06-14 MED ORDER — ROCURONIUM BROMIDE 10 MG/ML (PF) SYRINGE
PREFILLED_SYRINGE | INTRAVENOUS | Status: AC
  Administered 2023-06-14: 50 mg via INTRAVENOUS
  Filled 2023-06-14: qty 10

## 2023-06-14 MED ORDER — MIDAZOLAM HCL 2 MG/2ML IJ SOLN: 2.0000 mg | Freq: Once | INTRAMUSCULAR | Status: AC

## 2023-06-14 MED ORDER — TRAVASOL 10 % IV SOLN
INTRAVENOUS | Status: AC
  Filled 2023-06-14: qty 804

## 2023-06-14 MED ORDER — ROCURONIUM BROMIDE 50 MG/5ML IV SOLN: 100.0000 mg | Freq: Once | INTRAVENOUS | Status: AC

## 2023-06-14 MED ORDER — POTASSIUM PHOSPHATES 15 MMOLE/5ML IV SOLN
30.0000 mmol | Freq: Once | INTRAVENOUS | Status: AC
  Administered 2023-06-14: 30 mmol via INTRAVENOUS
  Filled 2023-06-14: qty 10

## 2023-06-14 MED ORDER — ETOMIDATE 2 MG/ML IV SOLN
INTRAVENOUS | Status: AC
  Administered 2023-06-14: 20 mg via INTRAVENOUS
  Filled 2023-06-14: qty 20

## 2023-06-14 MED ORDER — CLONAZEPAM 0.5 MG PO TABS
0.5000 mg | ORAL_TABLET | Freq: Two times a day (BID) | ORAL | Status: DC
  Administered 2023-06-14 (×2): 0.5 mg
  Filled 2023-06-14 (×2): qty 1

## 2023-06-14 MED ORDER — POTASSIUM CHLORIDE 10 MEQ/50ML IV SOLN
10.0000 meq | INTRAVENOUS | Status: DC
  Administered 2023-06-14: 10 meq via INTRAVENOUS
  Filled 2023-06-14 (×4): qty 50

## 2023-06-14 MED ORDER — QUETIAPINE FUMARATE 100 MG PO TABS
100.0000 mg | ORAL_TABLET | Freq: Two times a day (BID) | ORAL | Status: DC
  Administered 2023-06-14 (×2): 100 mg
  Filled 2023-06-14 (×2): qty 1

## 2023-06-14 MED ORDER — THIAMINE HCL 100 MG/ML IJ SOLN
100.0000 mg | Freq: Every day | INTRAMUSCULAR | Status: DC
  Administered 2023-06-14: 100 mg via INTRAVENOUS
  Filled 2023-06-14: qty 2

## 2023-06-14 MED ORDER — ETOMIDATE 2 MG/ML IV SOLN
INTRAVENOUS | Status: AC
  Administered 2023-06-14: 20 mg via INTRAVENOUS
  Filled 2023-06-14: qty 10

## 2023-06-14 NOTE — Procedures (Signed)
Intubation Procedure Note  Russell Garcia  [id number]  30-Mar-1956  Date:06/14/23  Time:8:38 AM   Provider Performing:Kadeen Sroka [name]    Procedure: Intubation ([address]00)  Indication(s) Respiratory Failure.  I was called to bedside emergently as patient had bit through ETT and bedside team unable to oxygenate. On my arrival copious leak, with patient desaturating and becoming hemodynamically unstable. We made the decision to remove ETT and reintubate.   Consent Unable to obtain consent due to emergent nature of procedure.   Anesthesia Etomidate and Rocuronium   Time Out Verified patient identification, verified procedure, site/side was marked, verified correct patient position, special equipment/implants available, medications/allergies/relevant history reviewed, required imaging and test results available.   Sterile Technique Usual hand hygeine, masks, and gloves were used   Procedure Description Patient positioned in bed supine.  Sedation given as noted above.  Patient was intubated with endotracheal tube using Glidescope.  View was Grade 1 full glottis .  Number of attempts was 1.  Colorimetric CO2 detector was consistent with tracheal placement.8.0 ETT placed   Complications/Tolerance None; patient tolerated the procedure well. Chest X-ray is ordered to verify placement.   EBL Minimal   Specimen(s) None  [name], MD Pulmonary and Critical Care Medicine [facility] 06/14/2023 8:39 AM Pager: see AMION  If no response to pager, please call critical care on call (see AMION) until 7pm After 7:00 pm call Elink
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
## 2023-06-14 NOTE — Progress Notes (Signed)
Patient ID: Russell Garcia, male   DOB: Jul 17, 1956, 67 y.o.   MRN: [id number] +cuff leak. Reintubated over a bougie by Dr. Celine Mans. Appreciate her help again. CXR P.  Violeta Gelinas, MD, MPH, FACS Please use [email or web address] to contact on call provider
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
## 2023-06-14 NOTE — Procedures (Signed)
Intubation Procedure Note  Russell Garcia  [id number]  09/12/1956  Date:06/14/23  Time:10:35 AM   Provider Performing:Carl Butner Russell Garcia    Procedure: Intubation (31500)  Indication(s) Respiratory Failure. Called to bedside for cuff leak following recent intubation. Decision made to reintubate.   Consent Risks of the procedure as well as the alternatives and risks of each were explained to the patient and/or caregiver.  Consent for the procedure was obtained and is signed in the bedside chart   Anesthesia Etomidate, Versed, and Rocuronium   Time Out Verified patient identification, verified procedure, site/side was marked, verified correct patient position, special equipment/implants available, medications/allergies/relevant history reviewed, required imaging and test results available.   Sterile Technique Usual hand hygeine, masks, and gloves were used   Procedure Description Patient positioned in bed supine.  Sedation given as noted above.  An ETT exchange was performed over a bougie. Laryngoscope was used to visualize attempt. An 8.0 ETT was lubricated and replaced.  Number of attempts was 1.  Colorimetric CO2 detector was consistent with tracheal placement.   Complications/Tolerance None; patient tolerated the procedure well. Chest X-ray is ordered to verify placement.   EBL Minimal   Specimen(s) None  Russell Salts, MD Pulmonary and Critical Care Medicine [facility] 06/14/2023 10:37 AM Pager: see AMION  If no response to pager, please call critical care on call (see AMION) until 7pm After 7:00 pm call Elink

## 2023-06-14 NOTE — Progress Notes (Signed)
Patient ID: Russell Garcia, male   DOB: 10-18-1956, 67 y.o.   MRN: [id number] I called his son, Russell Garcia, and updated him about this mornings events and Jahaan's progress.  Violeta Gelinas, MD, MPH, FACS Please use [email or web address] to contact on call provider

## 2023-06-14 NOTE — Progress Notes (Signed)
PT Cancellation Note  Patient Details Name: KASSEM CACY MRN: [id number] DOB: 1956/01/10   Cancelled Treatment:    Reason Eval/Treat Not Completed: Patient not medically ready, pt intubated and sedated. Will follow as appropriate.   Lyanne Co, PT  Acute Rehab Services Secure chat preferred Office [phone]    Lawana Chambers Kelby Lotspeich 06/14/2023, 10:12 AM
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
## 2023-06-14 NOTE — Progress Notes (Signed)
OT Cancellation Note  Patient Details Name: Russell Garcia MRN: [id number] DOB: 1956/03/02   Cancelled Treatment:    Reason Eval/Treat Not Completed: Medical issues which prohibited therapy- per RN difficulties with ETT this am, pt intubated and sedated.  Will follow and see as appropriate.   [name], OT Acute Rehabilitation Services Office [phone]   Russell Garcia 06/14/2023, 8:54 AM
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
## 2023-06-15 ENCOUNTER — Telehealth: Payer: Self-pay | Admitting: Vascular Surgery

## 2023-06-15 ENCOUNTER — Inpatient Hospital Stay (HOSPITAL_COMMUNITY): Payer: Medicare HMO

## 2023-06-15 DIAGNOSIS — S3991XA Unspecified injury of abdomen, initial encounter: Secondary | ICD-10-CM | POA: Diagnosis not present

## 2023-06-15 LAB — COMPREHENSIVE METABOLIC PANEL
ALT: 30 U/L (ref 0–44)
AST: 28 U/L (ref 15–41)
Albumin: 2.2 g/dL — ABNORMAL LOW (ref 3.5–5.0)
Alkaline Phosphatase: 36 U/L — ABNORMAL LOW (ref 38–126)
Anion gap: 9 (ref 5–15)
BUN: 19 mg/dL (ref 8–23)
CO2: 24 mmol/L (ref 22–32)
Calcium: 8.1 mg/dL — ABNORMAL LOW (ref 8.9–10.3)
Chloride: 112 mmol/L — ABNORMAL HIGH (ref 98–111)
Creatinine, Ser: 1.66 mg/dL — ABNORMAL HIGH (ref 0.61–1.24)
GFR, Estimated: 45 mL/min — ABNORMAL LOW (ref >60)
Glucose, Bld: 130 mg/dL — ABNORMAL HIGH (ref 70–99)
Potassium: 3.5 mmol/L (ref 3.5–5.1)
Sodium: 145 mmol/L (ref 135–145)
Total Bilirubin: 1.3 mg/dL — ABNORMAL HIGH (ref 0.3–1.2)
Total Protein: 6.1 g/dL — ABNORMAL LOW (ref 6.5–8.1)

## 2023-06-15 LAB — CBC
HCT: 27 % — ABNORMAL LOW (ref 39.0–52.0)
Hemoglobin: 8.6 g/dL — ABNORMAL LOW (ref 13.0–17.0)
MCH: 28 pg (ref 26.0–34.0)
MCHC: 31.9 g/dL (ref 30.0–36.0)
MCV: 87.9 fL (ref 80.0–100.0)
Platelets: 220 10*3/uL (ref 150–400)
RBC: 3.07 MIL/uL — ABNORMAL LOW (ref 4.22–5.81)
RDW: 17.9 % — ABNORMAL HIGH (ref 11.5–15.5)
WBC: 12.1 10*3/uL — ABNORMAL HIGH (ref 4.0–10.5)
nRBC: 0.2 % (ref 0.0–0.2)

## 2023-06-15 LAB — PHOSPHORUS: Phosphorus: 2.8 mg/dL (ref 2.5–4.6)

## 2023-06-15 LAB — MAGNESIUM: Magnesium: 2.4 mg/dL (ref 1.7–2.4)

## 2023-06-15 LAB — TRIGLYCERIDES: Triglycerides: 314 mg/dL — ABNORMAL HIGH (ref <150)

## 2023-06-15 LAB — POCT I-STAT 7, (LYTES, BLD GAS, ICA,H+H)
Acid-Base Excess: 1 mmol/L (ref 0.0–2.0)
Bicarbonate: 25.8 mmol/L (ref 20.0–28.0)
Calcium, Ion: 1.16 mmol/L (ref 1.15–1.40)
HCT: 26 % — ABNORMAL LOW (ref 39.0–52.0)
Hemoglobin: 8.8 g/dL — ABNORMAL LOW (ref 13.0–17.0)
O2 Saturation: 99 %
Patient temperature: 102
Potassium: 3.5 mmol/L (ref 3.5–5.1)
Sodium: 145 mmol/L (ref 135–145)
TCO2: 27 mmol/L (ref 22–32)
pCO2 arterial: 46.4 mmHg (ref 32–48)
pH, Arterial: 7.362 (ref 7.35–7.45)
pO2, Arterial: 141 mmHg — ABNORMAL HIGH (ref 83–108)

## 2023-06-15 LAB — GLUCOSE, CAPILLARY
Glucose-Capillary: 87 mg/dL (ref 70–99)
Glucose-Capillary: 124 mg/dL — ABNORMAL HIGH (ref 70–99)
Glucose-Capillary: 128 mg/dL — ABNORMAL HIGH (ref 70–99)
Glucose-Capillary: 113 mg/dL — ABNORMAL HIGH (ref 70–99)
Glucose-Capillary: 102 mg/dL — ABNORMAL HIGH (ref 70–99)
Glucose-Capillary: 146 mg/dL — ABNORMAL HIGH (ref 70–99)

## 2023-06-15 LAB — HEPARIN ANTI-XA: Heparin LMW: <0.1 IU/mL

## 2023-06-15 MED ORDER — BISACODYL 10 MG RE SUPP
10.0000 mg | Freq: Once | RECTAL | Status: AC
  Administered 2023-06-15: 10 mg via RECTAL
  Filled 2023-06-15: qty 1

## 2023-06-15 MED ORDER — HYDROMORPHONE HCL 1 MG/ML IJ SOLN
0.5000 mg | INTRAMUSCULAR | Status: DC | PRN
  Administered 2023-06-17: 0.5 mg via INTRAVENOUS
  Administered 2023-06-20 – 2023-07-06 (×26): 1 mg via INTRAVENOUS
  Filled 2023-06-15 (×29): qty 1

## 2023-06-15 MED ORDER — OXYCODONE HCL 5 MG PO TABS
5.0000 mg | ORAL_TABLET | ORAL | Status: DC | PRN
  Administered 2023-06-15: 5 mg via ORAL
  Administered 2023-06-15: 10 mg via ORAL
  Filled 2023-06-15: qty 2
  Filled 2023-06-15: qty 1
  Filled 2023-06-15: qty 2

## 2023-06-15 MED ORDER — HYDROXYZINE HCL 25 MG PO TABS
25.0000 mg | ORAL_TABLET | Freq: Three times a day (TID) | ORAL | Status: DC | PRN
  Administered 2023-06-15: 25 mg via ORAL
  Filled 2023-06-15: qty 1

## 2023-06-15 MED ORDER — QUETIAPINE FUMARATE 25 MG PO TABS: 50.0000 mg | ORAL_TABLET | Freq: Every day | ORAL | Status: DC

## 2023-06-15 MED ORDER — ENOXAPARIN SODIUM 80 MG/0.8ML IJ SOSY
70.0000 mg | PREFILLED_SYRINGE | Freq: Two times a day (BID) | INTRAMUSCULAR | Status: DC
  Administered 2023-06-15 – 2023-06-17 (×4): 70 mg via SUBCUTANEOUS
  Filled 2023-06-15 (×6): qty 0.7

## 2023-06-15 MED ORDER — SPIRONOLACTONE 25 MG PO TABS
25.0000 mg | ORAL_TABLET | Freq: Every day | ORAL | Status: DC
  Administered 2023-06-15 – 2023-06-17 (×3): 25 mg via ORAL
  Filled 2023-06-15 (×3): qty 1

## 2023-06-15 MED ORDER — ACETAMINOPHEN 325 MG PO TABS
650.0000 mg | ORAL_TABLET | Freq: Four times a day (QID) | ORAL | Status: DC
  Administered 2023-06-15 – 2023-06-17 (×7): 650 mg via ORAL
  Filled 2023-06-15 (×7): qty 2

## 2023-06-15 MED ORDER — QUETIAPINE FUMARATE 25 MG PO TABS
50.0000 mg | ORAL_TABLET | Freq: Every day | ORAL | Status: DC
  Administered 2023-06-15 – 2023-06-17 (×3): 50 mg via ORAL
  Filled 2023-06-15: qty 1
  Filled 2023-06-15 (×2): qty 2

## 2023-06-15 MED ORDER — HYDROMORPHONE HCL 1 MG/ML IJ SOLN
0.5000 mg | INTRAMUSCULAR | Status: DC | PRN
  Administered 2023-06-15: 1 mg via INTRAVENOUS
  Filled 2023-06-15: qty 1

## 2023-06-15 MED ORDER — CLONAZEPAM 0.5 MG PO TABS: 0.5000 mg | ORAL_TABLET | Freq: Two times a day (BID) | ORAL | Status: DC

## 2023-06-15 MED ORDER — TRACE MINERALS CU-MN-SE-ZN 300-55-60-3000 MCG/ML IV SOLN
INTRAVENOUS | Status: AC
  Filled 2023-06-15: qty 806.4

## 2023-06-15 MED ORDER — DOCUSATE SODIUM 100 MG PO CAPS
100.0000 mg | ORAL_CAPSULE | Freq: Two times a day (BID) | ORAL | Status: DC
  Administered 2023-06-15: 100 mg via ORAL
  Filled 2023-06-15 (×2): qty 1

## 2023-06-15 MED ORDER — INSULIN ASPART 100 UNIT/ML IJ SOLN
0.0000 [IU] | Freq: Four times a day (QID) | INTRAMUSCULAR | Status: DC
  Administered 2023-06-15 – 2023-06-18 (×10): 1 [IU] via SUBCUTANEOUS
  Administered 2023-06-18: 2 [IU] via SUBCUTANEOUS
  Administered 2023-06-18: 1 [IU] via SUBCUTANEOUS
  Administered 2023-06-19: 2 [IU] via SUBCUTANEOUS
  Administered 2023-06-19 – 2023-06-20 (×6): 1 [IU] via SUBCUTANEOUS
  Administered 2023-06-20: 2 [IU] via SUBCUTANEOUS
  Administered 2023-06-21 (×2): 1 [IU] via SUBCUTANEOUS

## 2023-06-15 MED ORDER — METOPROLOL TARTRATE 5 MG/5ML IV SOLN
5.0000 mg | Freq: Four times a day (QID) | INTRAVENOUS | Status: DC | PRN
  Administered 2023-06-15 – 2023-07-13 (×23): 5 mg via INTRAVENOUS
  Filled 2023-06-15 (×25): qty 5

## 2023-06-15 MED ORDER — ORAL CARE MOUTH RINSE: 15.0000 mL | OROMUCOSAL | Status: DC | PRN

## 2023-06-15 MED ORDER — METOPROLOL TARTRATE 50 MG PO TABS
75.0000 mg | ORAL_TABLET | Freq: Two times a day (BID) | ORAL | Status: DC
  Administered 2023-06-15 – 2023-06-17 (×5): 75 mg via ORAL
  Filled 2023-06-15 (×5): qty 1

## 2023-06-15 MED ORDER — SPIRONOLACTONE 25 MG PO TABS: 25.0000 mg | ORAL_TABLET | Freq: Every day | ORAL | Status: DC

## 2023-06-15 MED ORDER — LISINOPRIL 20 MG PO TABS: 20.0000 mg | ORAL_TABLET | Freq: Every day | ORAL | Status: DC

## 2023-06-15 MED ORDER — OXYCODONE HCL 5 MG PO TABS
10.0000 mg | ORAL_TABLET | Freq: Three times a day (TID) | ORAL | Status: DC
  Administered 2023-06-15 – 2023-06-17 (×6): 10 mg via ORAL
  Filled 2023-06-15 (×6): qty 2

## 2023-06-15 MED ORDER — ORAL CARE MOUTH RINSE
15.0000 mL | OROMUCOSAL | Status: DC
  Administered 2023-06-15 – 2023-06-16 (×3): 15 mL via OROMUCOSAL

## 2023-06-15 MED ORDER — THIAMINE MONONITRATE 100 MG PO TABS
100.0000 mg | ORAL_TABLET | Freq: Every day | ORAL | Status: DC
  Administered 2023-06-15 – 2023-06-18 (×4): 100 mg via ORAL
  Filled 2023-06-15 (×4): qty 1
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
## 2023-06-15 NOTE — Telephone Encounter (Signed)
Pt still in hospital
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
## 2023-06-15 NOTE — Progress Notes (Signed)
   Inpatient Rehab Admissions Coordinator :  Per therapy recommendations patient was screened for CIR candidacy by Danne Baxter RN MSN. Patient is not yet at a level to tolerate the intensity required to pursue CIR. Patient may have the potential to progress to become a candidate. The CIR admissions team will follow and monitor for progress and place a Rehab Consult order if felt to be appropriate. Please contact me with any questions.  Danne Baxter RN MSN Admissions Coordinator [phone]
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
## 2023-06-15 NOTE — Telephone Encounter (Signed)
-----   Message from Emilie Rutter sent at 06/15/2023  8:07 AM EDT ----- 3 weeks with staple removal with PA on [place] office day.  R brachial artery bypass Thanks
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
## 2023-06-15 NOTE — Progress Notes (Signed)
ET tube found in patient's cheek.  He bit through the tube multiple times today and now it appears he tongued the tube out.  He is following commands.  Sedation stopped immediately.  He is tachycardic and tachypneic but saturating with nonrebreather.  Respiratory status is tenuous, but have not been successful in keeping patient intubated over the last 24 hours.  Will observe on non-rebreather for now, may be reintubated if his respiratory status does not improve.  Discussed with anesthesia at bedside.  Quentin Ore, MD
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
## 2023-06-15 NOTE — Consult Note (Signed)
WOC Nurse wound follow up Patient was sitting up working with PT when I first arrived. Is verbal and pleasant.  Once back in bed, bedside RN provided IV analgesia prior to procedure.  Wound type:midline abdominal surgical incision.  Improved edema, wound contraction since last visit.  Measurement: 21 cm x 4.5 cm x 5.5 cm  Wound bed: Beefy red, bleeds with cleaning.  Photo in chart.  Drainage (amount, consistency, odor) moderate serosanguinous   Periwound: intact  Barrier ring at umbilicus and distal end of wound to promote seal.  Dressing procedure/placement/frequency: cleanse with NS. Apply one piece black foam. Cover with drape  seal achieved at 125 mmHg. Change Mon/WEd/Fri.  Will turn over to bedside RNs since straightforward midline dressing change. (Begin Friday)  Will not follow at this time.  Please re-consult if needed.  Mike Gip MSN, RN, FNP-BC CWON Wound, Ostomy, Continence Nurse Outpatient [facility] [phone] Pager [phone]

## 2023-06-16 ENCOUNTER — Inpatient Hospital Stay (HOSPITAL_COMMUNITY): Payer: Medicare HMO

## 2023-06-16 ENCOUNTER — Other Ambulatory Visit: Payer: Self-pay

## 2023-06-16 DIAGNOSIS — F430 Acute stress reaction: Secondary | ICD-10-CM

## 2023-06-16 DIAGNOSIS — W3400XA Accidental discharge from unspecified firearms or gun, initial encounter: Secondary | ICD-10-CM

## 2023-06-16 DIAGNOSIS — Z4682 Encounter for fitting and adjustment of non-vascular catheter: Secondary | ICD-10-CM | POA: Diagnosis not present

## 2023-06-16 DIAGNOSIS — Z452 Encounter for adjustment and management of vascular access device: Secondary | ICD-10-CM | POA: Diagnosis not present

## 2023-06-16 LAB — MAGNESIUM: Magnesium: 2.5 mg/dL — ABNORMAL HIGH (ref 1.7–2.4)

## 2023-06-16 LAB — CBC
HCT: 28.2 % — ABNORMAL LOW (ref 39.0–52.0)
Hemoglobin: 8.5 g/dL — ABNORMAL LOW (ref 13.0–17.0)
MCH: 27.5 pg (ref 26.0–34.0)
MCHC: 30.1 g/dL (ref 30.0–36.0)
MCV: 91.3 fL (ref 80.0–100.0)
Platelets: 305 10*3/uL (ref 150–400)
RBC: 3.09 MIL/uL — ABNORMAL LOW (ref 4.22–5.81)
RDW: 18.5 % — ABNORMAL HIGH (ref 11.5–15.5)
WBC: 12.7 10*3/uL — ABNORMAL HIGH (ref 4.0–10.5)
nRBC: 0.2 % (ref 0.0–0.2)

## 2023-06-16 LAB — COMPREHENSIVE METABOLIC PANEL
ALT: 28 U/L (ref 0–44)
AST: 36 U/L (ref 15–41)
Albumin: 2.1 g/dL — ABNORMAL LOW (ref 3.5–5.0)
Alkaline Phosphatase: 42 U/L (ref 38–126)
Anion gap: 7 (ref 5–15)
BUN: 15 mg/dL (ref 8–23)
CO2: 27 mmol/L (ref 22–32)
Calcium: 8.2 mg/dL — ABNORMAL LOW (ref 8.9–10.3)
Chloride: 110 mmol/L (ref 98–111)
Creatinine, Ser: 1.2 mg/dL (ref 0.61–1.24)
GFR, Estimated: >60 mL/min (ref >60)
Glucose, Bld: 137 mg/dL — ABNORMAL HIGH (ref 70–99)
Potassium: 4.2 mmol/L (ref 3.5–5.1)
Sodium: 144 mmol/L (ref 135–145)
Total Bilirubin: 0.9 mg/dL (ref 0.3–1.2)
Total Protein: 5.6 g/dL — ABNORMAL LOW (ref 6.5–8.1)

## 2023-06-16 LAB — PHOSPHORUS: Phosphorus: 3 mg/dL (ref 2.5–4.6)

## 2023-06-16 LAB — URINALYSIS, ROUTINE W REFLEX MICROSCOPIC
Bacteria, UA: NONE SEEN
Bilirubin Urine: NEGATIVE
Glucose, UA: NEGATIVE mg/dL
Ketones, ur: NEGATIVE mg/dL
Leukocytes,Ua: NEGATIVE
Nitrite: NEGATIVE
Protein, ur: 30 mg/dL — AB
Specific Gravity, Urine: 1.016 (ref 1.005–1.030)
pH: 5 (ref 5.0–8.0)

## 2023-06-16 LAB — TRIGLYCERIDES: Triglycerides: 279 mg/dL — ABNORMAL HIGH (ref <150)

## 2023-06-16 LAB — GLUCOSE, CAPILLARY
Glucose-Capillary: 138 mg/dL — ABNORMAL HIGH (ref 70–99)
Glucose-Capillary: 121 mg/dL — ABNORMAL HIGH (ref 70–99)
Glucose-Capillary: 125 mg/dL — ABNORMAL HIGH (ref 70–99)
Glucose-Capillary: 141 mg/dL — ABNORMAL HIGH (ref 70–99)
Glucose-Capillary: 120 mg/dL — ABNORMAL HIGH (ref 70–99)
Glucose-Capillary: 126 mg/dL — ABNORMAL HIGH (ref 70–99)

## 2023-06-16 MED ORDER — ORAL CARE MOUTH RINSE
15.0000 mL | OROMUCOSAL | Status: DC
  Administered 2023-06-16 – 2023-06-17 (×3): 15 mL via OROMUCOSAL

## 2023-06-16 MED ORDER — SODIUM CHLORIDE 0.9% FLUSH
10.0000 mL | Freq: Two times a day (BID) | INTRAVENOUS | Status: DC
  Administered 2023-06-16 – 2023-06-26 (×20): 10 mL
  Administered 2023-06-27: 20 mL
  Administered 2023-06-28 – 2023-06-29 (×4): 10 mL
  Administered 2023-06-30: 20 mL
  Administered 2023-06-30 – 2023-07-03 (×7): 10 mL
  Administered 2023-07-04: 30 mL
  Administered 2023-07-04 – 2023-07-06 (×4): 10 mL
  Administered 2023-07-06: 30 mL
  Administered 2023-07-07 (×2): 10 mL
  Administered 2023-07-08: 20 mL
  Administered 2023-07-08: 10 mL
  Administered 2023-07-09: 20 mL
  Administered 2023-07-09: 10 mL
  Administered 2023-07-10: 20 mL
  Administered 2023-07-10: 10 mL
  Administered 2023-07-11: 20 mL
  Administered 2023-07-11: 10 mL
  Administered 2023-07-12: 20 mL
  Administered 2023-07-13 – 2023-07-14 (×3): 10 mL

## 2023-06-16 MED ORDER — DOCUSATE SODIUM 50 MG/5ML PO LIQD
100.0000 mg | Freq: Two times a day (BID) | ORAL | Status: DC
  Administered 2023-06-16 – 2023-06-17 (×3): 100 mg
  Filled 2023-06-16 (×3): qty 10

## 2023-06-16 MED ORDER — IRBESARTAN 150 MG PO TABS
150.0000 mg | ORAL_TABLET | Freq: Every day | ORAL | Status: DC
  Administered 2023-06-16 – 2023-06-17 (×2): 150 mg via ORAL
  Filled 2023-06-16 (×2): qty 1

## 2023-06-16 MED ORDER — ORAL CARE MOUTH RINSE: 15.0000 mL | OROMUCOSAL | Status: DC | PRN

## 2023-06-16 MED ORDER — HYDROCHLOROTHIAZIDE 25 MG PO TABS
25.0000 mg | ORAL_TABLET | Freq: Every day | ORAL | Status: DC
  Administered 2023-06-16 – 2023-06-17 (×2): 25 mg via ORAL
  Filled 2023-06-16 (×2): qty 1

## 2023-06-16 MED ORDER — TRACE MINERALS CU-MN-SE-ZN 300-55-60-3000 MCG/ML IV SOLN
INTRAVENOUS | Status: AC
  Filled 2023-06-16: qty 1075.2

## 2023-06-16 MED ORDER — SODIUM CHLORIDE 0.9% FLUSH: 10.0000 mL | INTRAVENOUS | Status: DC | PRN

## 2023-06-16 MED ORDER — PROCHLORPERAZINE EDISYLATE 10 MG/2ML IJ SOLN
10.0000 mg | Freq: Four times a day (QID) | INTRAMUSCULAR | Status: AC
  Administered 2023-06-16 – 2023-06-18 (×8): 10 mg via INTRAVENOUS
  Filled 2023-06-16 (×8): qty 2

## 2023-06-16 MED ORDER — SODIUM CHLORIDE 0.9 % IV SOLN: INTRAVENOUS | Status: DC | PRN
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
## 2023-06-16 NOTE — Progress Notes (Signed)
Peripherally Inserted Central Catheter Placement  The IV Nurse has discussed with the patient and/or persons authorized to consent for the patient, the purpose of this procedure and the potential benefits and risks involved with this procedure.  The benefits include less needle sticks, lab draws from the catheter, and the patient may be discharged home with the catheter. Risks include, but not limited to, infection, bleeding, blood clot (thrombus formation), and puncture of an artery; nerve damage and irregular heartbeat and possibility to perform a PICC exchange if needed/ordered by physician.  Alternatives to this procedure were also discussed.  Bard Power PICC patient education guide, fact sheet on infection prevention and patient information card has been provided to patient /or left at bedside.  Verbal consent obtained with Luanna Salk, RN as witness due to patient being unable to hold pen with swollen right hand.  PICC Placement Documentation  PICC Double Lumen 06/16/23 Left Brachial 48 cm 0 cm (Active)  Indication for Insertion or Continuance of Line Administration of hyperosmolar/irritating solutions (i.e. TPN, Vancomycin, etc.) 06/16/23 1632  Exposed Catheter (cm) 0 cm 06/16/23 1632  Site Assessment Clean, Dry, Intact 06/16/23 1632  Lumen #1 Status Flushed;Saline locked;Blood return noted 06/16/23 1632  Lumen #2 Status Flushed;Saline locked;Blood return noted 06/16/23 1632  Dressing Type Transparent;Securing device 06/16/23 1632  Dressing Status Antimicrobial disc in place;Clean, Dry, Intact 06/16/23 1632  Line Care Connections checked and tightened 06/16/23 1632  Line Adjustment (NICU/IV Team Only) No 06/16/23 1632  Dressing Intervention New dressing 06/16/23 1632  Dressing Change Due 06/23/23 06/16/23 1632       Sharee Sturdy, Lajean Manes 06/16/2023, 4:32 PM
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
## 2023-06-16 NOTE — Progress Notes (Signed)
PICC order noted, spoke with Molly Maduro, RN via secure chat.  Aware that plan is to place PICC later today.
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
## 2023-06-16 NOTE — Progress Notes (Signed)
Patient arrived to the unit via bed/primary nurse. Oriented patient to equipment. Assessment in flowsheet.

## 2023-06-17 ENCOUNTER — Inpatient Hospital Stay (HOSPITAL_COMMUNITY): Payer: Medicare HMO

## 2023-06-17 ENCOUNTER — Other Ambulatory Visit: Payer: Self-pay

## 2023-06-17 DIAGNOSIS — J9811 Atelectasis: Secondary | ICD-10-CM | POA: Diagnosis not present

## 2023-06-17 DIAGNOSIS — R918 Other nonspecific abnormal finding of lung field: Secondary | ICD-10-CM | POA: Diagnosis not present

## 2023-06-17 DIAGNOSIS — J984 Other disorders of lung: Secondary | ICD-10-CM | POA: Diagnosis not present

## 2023-06-17 DIAGNOSIS — Z452 Encounter for adjustment and management of vascular access device: Secondary | ICD-10-CM | POA: Diagnosis not present

## 2023-06-17 DIAGNOSIS — A419 Sepsis, unspecified organism: Secondary | ICD-10-CM | POA: Diagnosis not present

## 2023-06-17 DIAGNOSIS — R0989 Other specified symptoms and signs involving the circulatory and respiratory systems: Secondary | ICD-10-CM | POA: Diagnosis not present

## 2023-06-17 DIAGNOSIS — J90 Pleural effusion, not elsewhere classified: Secondary | ICD-10-CM | POA: Diagnosis not present

## 2023-06-17 DIAGNOSIS — Z4682 Encounter for fitting and adjustment of non-vascular catheter: Secondary | ICD-10-CM | POA: Diagnosis not present

## 2023-06-17 LAB — CBC
HCT: 27.8 % — ABNORMAL LOW (ref 39.0–52.0)
Hemoglobin: 8.4 g/dL — ABNORMAL LOW (ref 13.0–17.0)
MCH: 27.2 pg (ref 26.0–34.0)
MCHC: 30.2 g/dL (ref 30.0–36.0)
MCV: 90 fL (ref 80.0–100.0)
Platelets: 356 10*3/uL (ref 150–400)
RBC: 3.09 MIL/uL — ABNORMAL LOW (ref 4.22–5.81)
RDW: 19.1 % — ABNORMAL HIGH (ref 11.5–15.5)
WBC: 13.7 10*3/uL — ABNORMAL HIGH (ref 4.0–10.5)
nRBC: 0.4 % — ABNORMAL HIGH (ref 0.0–0.2)

## 2023-06-17 LAB — BASIC METABOLIC PANEL
Anion gap: 6 (ref 5–15)
BUN: 19 mg/dL (ref 8–23)
CO2: 31 mmol/L (ref 22–32)
Calcium: 8.7 mg/dL — ABNORMAL LOW (ref 8.9–10.3)
Chloride: 109 mmol/L (ref 98–111)
Creatinine, Ser: 1.37 mg/dL — ABNORMAL HIGH (ref 0.61–1.24)
GFR, Estimated: 57 mL/min — ABNORMAL LOW (ref >60)
Glucose, Bld: 154 mg/dL — ABNORMAL HIGH (ref 70–99)
Potassium: 4.4 mmol/L (ref 3.5–5.1)
Sodium: 146 mmol/L — ABNORMAL HIGH (ref 135–145)

## 2023-06-17 LAB — MAGNESIUM: Magnesium: 2.2 mg/dL (ref 1.7–2.4)

## 2023-06-17 LAB — PHOSPHORUS: Phosphorus: 1.8 mg/dL — ABNORMAL LOW (ref 2.5–4.6)

## 2023-06-17 LAB — GLUCOSE, CAPILLARY
Glucose-Capillary: 105 mg/dL — ABNORMAL HIGH (ref 70–99)
Glucose-Capillary: 128 mg/dL — ABNORMAL HIGH (ref 70–99)
Glucose-Capillary: 133 mg/dL — ABNORMAL HIGH (ref 70–99)
Glucose-Capillary: 139 mg/dL — ABNORMAL HIGH (ref 70–99)
Glucose-Capillary: 142 mg/dL — ABNORMAL HIGH (ref 70–99)
Glucose-Capillary: 155 mg/dL — ABNORMAL HIGH (ref 70–99)

## 2023-06-17 LAB — POCT I-STAT 7, (LYTES, BLD GAS, ICA,H+H)
Acid-Base Excess: 7 mmol/L — ABNORMAL HIGH (ref 0.0–2.0)
Bicarbonate: 31.4 mmol/L — ABNORMAL HIGH (ref 20.0–28.0)
Calcium, Ion: 1.18 mmol/L (ref 1.15–1.40)
HCT: 21 % — ABNORMAL LOW (ref 39.0–52.0)
Hemoglobin: 7.1 g/dL — ABNORMAL LOW (ref 13.0–17.0)
O2 Saturation: 100 %
Patient temperature: 99
Potassium: 4.3 mmol/L (ref 3.5–5.1)
Sodium: 146 mmol/L — ABNORMAL HIGH (ref 135–145)
TCO2: 33 mmol/L — ABNORMAL HIGH (ref 22–32)
pCO2 arterial: 41.7 mmHg (ref 32–48)
pH, Arterial: 7.486 — ABNORMAL HIGH (ref 7.35–7.45)
pO2, Arterial: 282 mmHg — ABNORMAL HIGH (ref 83–108)

## 2023-06-17 LAB — EXPECTORATED SPUTUM ASSESSMENT W GRAM STAIN, RFLX TO RESP C

## 2023-06-17 LAB — TRIGLYCERIDES: Triglycerides: 223 mg/dL — ABNORMAL HIGH (ref <150)

## 2023-06-17 MED ORDER — FENTANYL 2500MCG IN NS 250ML (10MCG/ML) PREMIX INFUSION
0.0000 ug/h | INTRAVENOUS | Status: DC
  Administered 2023-06-17: 50 ug/h via INTRAVENOUS
  Administered 2023-06-18: 150 ug/h via INTRAVENOUS
  Administered 2023-06-19: 200 ug/h via INTRAVENOUS
  Administered 2023-06-19: 250 ug/h via INTRAVENOUS
  Administered 2023-06-20 – 2023-06-21 (×5): 200 ug/h via INTRAVENOUS
  Administered 2023-06-22: 150 ug/h via INTRAVENOUS
  Filled 2023-06-17 (×10): qty 250

## 2023-06-17 MED ORDER — ORAL CARE MOUTH RINSE
15.0000 mL | OROMUCOSAL | Status: DC
  Administered 2023-06-17 – 2023-07-04 (×197): 15 mL via OROMUCOSAL

## 2023-06-17 MED ORDER — FENTANYL CITRATE PF 50 MCG/ML IJ SOSY
PREFILLED_SYRINGE | INTRAMUSCULAR | Status: AC
  Filled 2023-06-17: qty 2

## 2023-06-17 MED ORDER — LACTATED RINGERS IV BOLUS
1000.0000 mL | Freq: Once | INTRAVENOUS | Status: AC
  Administered 2023-06-17: 1000 mL via INTRAVENOUS

## 2023-06-17 MED ORDER — SUCCINYLCHOLINE CHLORIDE 200 MG/10ML IV SOSY
PREFILLED_SYRINGE | INTRAVENOUS | Status: AC
  Filled 2023-06-17: qty 10

## 2023-06-17 MED ORDER — ROCURONIUM BROMIDE 10 MG/ML (PF) SYRINGE
PREFILLED_SYRINGE | INTRAVENOUS | Status: AC
  Administered 2023-06-17: 100 mg
  Filled 2023-06-17: qty 10

## 2023-06-17 MED ORDER — METOPROLOL TARTRATE 5 MG/5ML IV SOLN
10.0000 mg | Freq: Four times a day (QID) | INTRAVENOUS | Status: DC
  Administered 2023-06-17 – 2023-06-20 (×9): 10 mg via INTRAVENOUS
  Filled 2023-06-17 (×9): qty 10

## 2023-06-17 MED ORDER — IOHEXOL 350 MG/ML SOLN
75.0000 mL | Freq: Once | INTRAVENOUS | Status: AC | PRN
  Administered 2023-06-17: 75 mL via INTRAVENOUS

## 2023-06-17 MED ORDER — ROCURONIUM BROMIDE 50 MG/5ML IV SOLN: 100.0000 mg | Freq: Once | INTRAVENOUS | Status: AC

## 2023-06-17 MED ORDER — ETOMIDATE 2 MG/ML IV SOLN: 20.0000 mg | Freq: Once | INTRAVENOUS | Status: AC

## 2023-06-17 MED ORDER — FENTANYL CITRATE PF 50 MCG/ML IJ SOSY: 50.0000 ug | PREFILLED_SYRINGE | Freq: Once | INTRAMUSCULAR | Status: DC

## 2023-06-17 MED ORDER — FENTANYL BOLUS VIA INFUSION
50.0000 ug | INTRAVENOUS | Status: DC | PRN
  Administered 2023-06-18 – 2023-07-03 (×29): 50 ug via INTRAVENOUS

## 2023-06-17 MED ORDER — ROCURONIUM BROMIDE 50 MG/5ML IV SOLN
120.0000 mg | Freq: Once | INTRAVENOUS | Status: DC
  Filled 2023-06-17: qty 12

## 2023-06-17 MED ORDER — ACETAMINOPHEN 10 MG/ML IV SOLN
1000.0000 mg | Freq: Four times a day (QID) | INTRAVENOUS | Status: AC
  Administered 2023-06-17 – 2023-06-18 (×4): 1000 mg via INTRAVENOUS
  Filled 2023-06-17 (×4): qty 100

## 2023-06-17 MED ORDER — MIDAZOLAM HCL 2 MG/2ML IJ SOLN
INTRAMUSCULAR | Status: AC
  Filled 2023-06-17: qty 2

## 2023-06-17 MED ORDER — KETAMINE HCL 50 MG/5ML IJ SOSY
PREFILLED_SYRINGE | INTRAMUSCULAR | Status: AC
  Filled 2023-06-17: qty 10

## 2023-06-17 MED ORDER — TRACE MINERALS CU-MN-SE-ZN 300-55-60-3000 MCG/ML IV SOLN
INTRAVENOUS | Status: AC
  Filled 2023-06-17: qty 1075.2

## 2023-06-17 MED ORDER — ETOMIDATE 2 MG/ML IV SOLN
INTRAVENOUS | Status: AC
  Administered 2023-06-17: 20 mg
  Filled 2023-06-17: qty 20

## 2023-06-17 MED ORDER — POTASSIUM PHOSPHATES 15 MMOLE/5ML IV SOLN
15.0000 mmol | Freq: Once | INTRAVENOUS | Status: AC
  Administered 2023-06-17: 15 mmol via INTRAVENOUS
  Filled 2023-06-17: qty 5

## 2023-06-17 MED ORDER — ORAL CARE MOUTH RINSE: 15.0000 mL | OROMUCOSAL | Status: DC | PRN

## 2023-06-17 MED ORDER — ETOMIDATE 2 MG/ML IV SOLN: 40.0000 mg | Freq: Once | INTRAVENOUS | Status: DC

## 2023-06-17 MED ORDER — ENOXAPARIN SODIUM 80 MG/0.8ML IJ SOSY
70.0000 mg | PREFILLED_SYRINGE | Freq: Once | INTRAMUSCULAR | Status: AC
  Administered 2023-06-17: 70 mg via SUBCUTANEOUS
  Filled 2023-06-17: qty 0.7

## 2023-06-17 MED ORDER — SODIUM PHOSPHATES 45 MMOLE/15ML IV SOLN
30.0000 mmol | Freq: Once | INTRAVENOUS | Status: AC
  Administered 2023-06-17: 30 mmol via INTRAVENOUS
  Filled 2023-06-17: qty 10

## 2023-06-17 MED ORDER — IPRATROPIUM-ALBUTEROL 0.5-2.5 (3) MG/3ML IN SOLN
3.0000 mL | Freq: Four times a day (QID) | RESPIRATORY_TRACT | Status: AC
  Administered 2023-06-17 – 2023-06-23 (×24): 3 mL via RESPIRATORY_TRACT
  Filled 2023-06-17 (×24): qty 3

## 2023-06-17 MED ORDER — ENOXAPARIN SODIUM 150 MG/ML IJ SOSY
140.0000 mg | PREFILLED_SYRINGE | Freq: Two times a day (BID) | INTRAMUSCULAR | Status: DC
  Administered 2023-06-17: 140 mg via SUBCUTANEOUS
  Filled 2023-06-17 (×2): qty 0.94

## 2023-06-17 MED ORDER — DEXMEDETOMIDINE HCL IN NACL 400 MCG/100ML IV SOLN
0.0000 ug/kg/h | INTRAVENOUS | Status: DC
  Administered 2023-06-17: 0.4 ug/kg/h via INTRAVENOUS
  Administered 2023-06-18: 0.9 ug/kg/h via INTRAVENOUS
  Administered 2023-06-18 (×2): 0.4 ug/kg/h via INTRAVENOUS
  Administered 2023-06-18: 0.6 ug/kg/h via INTRAVENOUS
  Administered 2023-06-18: 0.8 ug/kg/h via INTRAVENOUS
  Administered 2023-06-19 – 2023-06-20 (×16): 1.2 ug/kg/h via INTRAVENOUS
  Administered 2023-06-21 (×2): 1.6 ug/kg/h via INTRAVENOUS
  Administered 2023-06-21: 1.8 ug/kg/h via INTRAVENOUS
  Administered 2023-06-21: 1.6 ug/kg/h via INTRAVENOUS
  Administered 2023-06-21: 1.8 ug/kg/h via INTRAVENOUS
  Administered 2023-06-21: 1.2 ug/kg/h via INTRAVENOUS
  Administered 2023-06-21 (×2): 2 ug/kg/h via INTRAVENOUS
  Administered 2023-06-21: 1.2 ug/kg/h via INTRAVENOUS
  Administered 2023-06-21: 2 ug/kg/h via INTRAVENOUS
  Administered 2023-06-21: 1.8 ug/kg/h via INTRAVENOUS
  Administered 2023-06-21: 1.6 ug/kg/h via INTRAVENOUS
  Administered 2023-06-21: 2 ug/kg/h via INTRAVENOUS
  Administered 2023-06-22: 1.6 ug/kg/h via INTRAVENOUS
  Administered 2023-06-22: 1 ug/kg/h via INTRAVENOUS
  Administered 2023-06-22: 1.6 ug/kg/h via INTRAVENOUS
  Administered 2023-06-22: 1 ug/kg/h via INTRAVENOUS
  Administered 2023-06-22: 1.6 ug/kg/h via INTRAVENOUS
  Administered 2023-06-22: 1.2 ug/kg/h via INTRAVENOUS
  Administered 2023-06-22: 1.6 ug/kg/h via INTRAVENOUS
  Administered 2023-06-22: 1.2 ug/kg/h via INTRAVENOUS
  Administered 2023-06-22: 0.4 ug/kg/h via INTRAVENOUS
  Administered 2023-06-22 (×2): 1.6 ug/kg/h via INTRAVENOUS
  Administered 2023-06-23 (×2): 0.6 ug/kg/h via INTRAVENOUS
  Administered 2023-06-23 (×2): 1 ug/kg/h via INTRAVENOUS
  Administered 2023-06-23 – 2023-06-24 (×2): 0.6 ug/kg/h via INTRAVENOUS
  Administered 2023-06-25: 0.4 ug/kg/h via INTRAVENOUS
  Administered 2023-06-25: 0.1 ug/kg/h via INTRAVENOUS
  Administered 2023-06-26: 0.4 ug/kg/h via INTRAVENOUS
  Administered 2023-06-26: 0.3 ug/kg/h via INTRAVENOUS
  Administered 2023-06-26: 0.5 ug/kg/h via INTRAVENOUS
  Filled 2023-06-17 (×9): qty 100
  Filled 2023-06-17: qty 200
  Filled 2023-06-17 (×2): qty 100
  Filled 2023-06-17: qty 200
  Filled 2023-06-17 (×7): qty 100
  Filled 2023-06-17: qty 200
  Filled 2023-06-17 (×18): qty 100
  Filled 2023-06-17 (×3): qty 200
  Filled 2023-06-17 (×3): qty 100
  Filled 2023-06-17: qty 200
  Filled 2023-06-17 (×6): qty 100
  Filled 2023-06-17: qty 200
  Filled 2023-06-17 (×3): qty 100
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
## 2023-06-17 NOTE — Progress Notes (Signed)
Patient NTS and sample taken to lab.

## 2023-06-17 NOTE — Progress Notes (Signed)
PICC exchange from DL to TL PICC for more IV access.

## 2023-06-17 NOTE — Progress Notes (Signed)
Patient's temp is 103. Tylenol was given. Notified PA.

## 2023-06-17 NOTE — Progress Notes (Signed)
hydralazine IV given for high BP and pt running borderline fever 100.6 cold compress placed on pt MD was informed.Will continue to monitor.
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
## 2023-06-17 NOTE — Progress Notes (Signed)
New TPN bag infusion held at this time d/t anticipation of PICC exchange for TL PICC. TPN bag on BS table RN notified as well as VAST. Current bag of TPN currently infusing. Tomasita Morrow, RN VAST

## 2023-06-17 NOTE — Progress Notes (Signed)
OT Cancellation Note  Patient Details Name: Russell Garcia MRN: [id number] DOB: Nov 12, 1955   Cancelled Treatment:    Reason Eval/Treat Not Completed: Medical issues which prohibited therapy Patient with fever and increased respiratory rate. RN requesting OT hold until more medically stable. OT will check back when medically appropriate.   Pollyann Glen E. Alayna Mabe, OTR/L Acute Rehabilitation Services [phone]   Cherlyn Cushing 06/17/2023, 10:14 AM

## 2023-06-17 NOTE — Consult Note (Signed)
Patient is critically ill and being transported to 4N ICU for intubation. Please reconsult psychiatry once medically stable. We will dc psych consult at this time.

## 2023-06-17 NOTE — Care Management Important Message (Signed)
Important Message  Patient Details  Name: Russell Garcia MRN: [id number] Date of Birth: Apr 09, 1956   Medicare Important Message Given:  Yes     Abdulraheem Pineo Stefan Church 06/17/2023, 1:14 PM

## 2023-06-18 LAB — CBC
HCT: 21.6 % — ABNORMAL LOW (ref 39.0–52.0)
Hemoglobin: 6.7 g/dL — CL (ref 13.0–17.0)
MCH: 28.2 pg (ref 26.0–34.0)
MCHC: 31 g/dL (ref 30.0–36.0)
MCV: 90.8 fL (ref 80.0–100.0)
Platelets: 315 10*3/uL (ref 150–400)
RBC: 2.38 MIL/uL — ABNORMAL LOW (ref 4.22–5.81)
RDW: 19.1 % — ABNORMAL HIGH (ref 11.5–15.5)
WBC: 12.6 10*3/uL — ABNORMAL HIGH (ref 4.0–10.5)
nRBC: 0.2 % (ref 0.0–0.2)

## 2023-06-18 LAB — COMPREHENSIVE METABOLIC PANEL
ALT: 26 U/L (ref 0–44)
AST: 29 U/L (ref 15–41)
Albumin: 2 g/dL — ABNORMAL LOW (ref 3.5–5.0)
Alkaline Phosphatase: 32 U/L — ABNORMAL LOW (ref 38–126)
Anion gap: 11 (ref 5–15)
BUN: 31 mg/dL — ABNORMAL HIGH (ref 8–23)
CO2: 28 mmol/L (ref 22–32)
Calcium: 8 mg/dL — ABNORMAL LOW (ref 8.9–10.3)
Chloride: 105 mmol/L (ref 98–111)
Creatinine, Ser: 2 mg/dL — ABNORMAL HIGH (ref 0.61–1.24)
GFR, Estimated: 36 mL/min — ABNORMAL LOW (ref >60)
Glucose, Bld: 151 mg/dL — ABNORMAL HIGH (ref 70–99)
Potassium: 4 mmol/L (ref 3.5–5.1)
Sodium: 144 mmol/L (ref 135–145)
Total Bilirubin: 0.9 mg/dL (ref 0.3–1.2)
Total Protein: 5.3 g/dL — ABNORMAL LOW (ref 6.5–8.1)

## 2023-06-18 LAB — HEPARIN LEVEL (UNFRACTIONATED): Heparin Unfractionated: 0.67 [IU]/mL (ref 0.30–0.70)

## 2023-06-18 LAB — TYPE AND SCREEN
ABO/RH(D): A POS
Antibody Screen: NEGATIVE
Unit division: 0

## 2023-06-18 LAB — GLUCOSE, CAPILLARY
Glucose-Capillary: 140 mg/dL — ABNORMAL HIGH (ref 70–99)
Glucose-Capillary: 140 mg/dL — ABNORMAL HIGH (ref 70–99)
Glucose-Capillary: 141 mg/dL — ABNORMAL HIGH (ref 70–99)
Glucose-Capillary: 154 mg/dL — ABNORMAL HIGH (ref 70–99)
Glucose-Capillary: 139 mg/dL — ABNORMAL HIGH (ref 70–99)

## 2023-06-18 LAB — BPAM RBC
Blood Product Expiration Date: 202408282359
ISSUE DATE / TIME: 202408240834
Unit Type and Rh: 6200

## 2023-06-18 LAB — HEMOGLOBIN AND HEMATOCRIT, BLOOD
HCT: 24.5 % — ABNORMAL LOW (ref 39.0–52.0)
Hemoglobin: 7.8 g/dL — ABNORMAL LOW (ref 13.0–17.0)

## 2023-06-18 LAB — PREPARE RBC (CROSSMATCH)

## 2023-06-18 LAB — PHOSPHORUS: Phosphorus: 3.7 mg/dL (ref 2.5–4.6)

## 2023-06-18 LAB — APTT: aPTT: 129 seconds — ABNORMAL HIGH (ref 24–36)

## 2023-06-18 MED ORDER — ACETAMINOPHEN 10 MG/ML IV SOLN
1000.0000 mg | Freq: Four times a day (QID) | INTRAVENOUS | Status: AC
  Administered 2023-06-18 – 2023-06-19 (×3): 1000 mg via INTRAVENOUS
  Filled 2023-06-18 (×3): qty 100

## 2023-06-18 MED ORDER — PHENYLEPHRINE HCL-NACL 20-0.9 MG/250ML-% IV SOLN
0.0000 ug/min | INTRAVENOUS | Status: DC
  Administered 2023-06-18: 20 ug/min via INTRAVENOUS
  Filled 2023-06-18: qty 250

## 2023-06-18 MED ORDER — THIAMINE MONONITRATE 100 MG PO TABS: 100.0000 mg | ORAL_TABLET | Freq: Every day | ORAL | Status: DC

## 2023-06-18 MED ORDER — QUETIAPINE FUMARATE 25 MG PO TABS: 50.0000 mg | ORAL_TABLET | Freq: Every day | ORAL | Status: DC

## 2023-06-18 MED ORDER — HEPARIN (PORCINE) 25000 UT/250ML-% IV SOLN
2500.0000 [IU]/h | INTRAVENOUS | Status: AC
  Administered 2023-06-18 – 2023-06-20 (×4): 1800 [IU]/h via INTRAVENOUS
  Administered 2023-06-20: 2200 [IU]/h via INTRAVENOUS
  Administered 2023-06-21: 2400 [IU]/h via INTRAVENOUS
  Administered 2023-06-21 – 2023-06-22 (×2): 2500 [IU]/h via INTRAVENOUS
  Filled 2023-06-18 (×8): qty 250

## 2023-06-18 MED ORDER — SODIUM CHLORIDE 0.9 % IV BOLUS
500.0000 mL | Freq: Once | INTRAVENOUS | Status: AC
  Administered 2023-06-18: 500 mL via INTRAVENOUS

## 2023-06-18 MED ORDER — TRACE MINERALS CU-MN-SE-ZN 300-55-60-3000 MCG/ML IV SOLN
INTRAVENOUS | Status: AC
  Filled 2023-06-18: qty 1126.4

## 2023-06-18 MED ORDER — SODIUM CHLORIDE 0.9% IV SOLUTION: Freq: Once | INTRAVENOUS | Status: AC
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
## 2023-06-19 LAB — BASIC METABOLIC PANEL
Anion gap: 5 (ref 5–15)
BUN: 29 mg/dL — ABNORMAL HIGH (ref 8–23)
CO2: 29 mmol/L (ref 22–32)
Calcium: 8.4 mg/dL — ABNORMAL LOW (ref 8.9–10.3)
Chloride: 110 mmol/L (ref 98–111)
Creatinine, Ser: 1.2 mg/dL (ref 0.61–1.24)
GFR, Estimated: >60 mL/min (ref >60)
Glucose, Bld: 147 mg/dL — ABNORMAL HIGH (ref 70–99)
Potassium: 4.1 mmol/L (ref 3.5–5.1)
Sodium: 144 mmol/L (ref 135–145)

## 2023-06-19 LAB — PHOSPHORUS: Phosphorus: 2.7 mg/dL (ref 2.5–4.6)

## 2023-06-19 LAB — HEPARIN LEVEL (UNFRACTIONATED): Heparin Unfractionated: 0.61 [IU]/mL (ref 0.30–0.70)

## 2023-06-19 LAB — MAGNESIUM: Magnesium: 2.2 mg/dL (ref 1.7–2.4)

## 2023-06-19 LAB — TRIGLYCERIDES: Triglycerides: 143 mg/dL (ref <150)

## 2023-06-19 LAB — GLUCOSE, CAPILLARY
Glucose-Capillary: 130 mg/dL — ABNORMAL HIGH (ref 70–99)
Glucose-Capillary: 134 mg/dL — ABNORMAL HIGH (ref 70–99)
Glucose-Capillary: 141 mg/dL — ABNORMAL HIGH (ref 70–99)
Glucose-Capillary: 145 mg/dL — ABNORMAL HIGH (ref 70–99)
Glucose-Capillary: 146 mg/dL — ABNORMAL HIGH (ref 70–99)
Glucose-Capillary: 146 mg/dL — ABNORMAL HIGH (ref 70–99)
Glucose-Capillary: 154 mg/dL — ABNORMAL HIGH (ref 70–99)

## 2023-06-19 LAB — CBC
HCT: 24.7 % — ABNORMAL LOW (ref 39.0–52.0)
Hemoglobin: 7.6 g/dL — ABNORMAL LOW (ref 13.0–17.0)
MCH: 26.7 pg (ref 26.0–34.0)
MCHC: 30.8 g/dL (ref 30.0–36.0)
MCV: 86.7 fL (ref 80.0–100.0)
Platelets: 367 10*3/uL (ref 150–400)
RBC: 2.85 MIL/uL — ABNORMAL LOW (ref 4.22–5.81)
RDW: 18.4 % — ABNORMAL HIGH (ref 11.5–15.5)
WBC: 12.3 10*3/uL — ABNORMAL HIGH (ref 4.0–10.5)
nRBC: 0.4 % — ABNORMAL HIGH (ref 0.0–0.2)

## 2023-06-19 MED ORDER — THIAMINE HCL 100 MG/ML IJ SOLN
100.0000 mg | Freq: Every day | INTRAMUSCULAR | Status: DC
  Administered 2023-06-19 – 2023-06-21 (×3): 100 mg via INTRAVENOUS
  Filled 2023-06-19 (×3): qty 2

## 2023-06-19 MED ORDER — TRACE MINERALS CU-MN-SE-ZN 300-55-60-3000 MCG/ML IV SOLN
INTRAVENOUS | Status: AC
  Filled 2023-06-19: qty 1126.4

## 2023-06-19 MED ORDER — ACETAMINOPHEN 10 MG/ML IV SOLN
1000.0000 mg | Freq: Four times a day (QID) | INTRAVENOUS | Status: AC
  Administered 2023-06-19 – 2023-06-20 (×4): 1000 mg via INTRAVENOUS
  Filled 2023-06-19 (×4): qty 100
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
## 2023-06-20 LAB — CULTURE, RESPIRATORY W GRAM STAIN: Culture: NORMAL

## 2023-06-20 LAB — PHOSPHORUS: Phosphorus: 3.3 mg/dL (ref 2.5–4.6)

## 2023-06-20 LAB — COMPREHENSIVE METABOLIC PANEL
ALT: 27 U/L (ref 0–44)
AST: 37 U/L (ref 15–41)
Albumin: 2.3 g/dL — ABNORMAL LOW (ref 3.5–5.0)
Alkaline Phosphatase: 48 U/L (ref 38–126)
Anion gap: 13 (ref 5–15)
BUN: 36 mg/dL — ABNORMAL HIGH (ref 8–23)
CO2: 20 mmol/L — ABNORMAL LOW (ref 22–32)
Calcium: 8.5 mg/dL — ABNORMAL LOW (ref 8.9–10.3)
Chloride: 109 mmol/L (ref 98–111)
Creatinine, Ser: 1.17 mg/dL (ref 0.61–1.24)
GFR, Estimated: >60 mL/min (ref >60)
Glucose, Bld: 159 mg/dL — ABNORMAL HIGH (ref 70–99)
Potassium: 4.7 mmol/L (ref 3.5–5.1)
Sodium: 142 mmol/L (ref 135–145)
Total Bilirubin: 0.7 mg/dL (ref 0.3–1.2)
Total Protein: 6.4 g/dL — ABNORMAL LOW (ref 6.5–8.1)

## 2023-06-20 LAB — HEPARIN LEVEL (UNFRACTIONATED)
Heparin Unfractionated: 0.23 [IU]/mL — ABNORMAL LOW (ref 0.30–0.70)
Heparin Unfractionated: 0.21 [IU]/mL — ABNORMAL LOW (ref 0.30–0.70)
Heparin Unfractionated: 0.24 [IU]/mL — ABNORMAL LOW (ref 0.30–0.70)
Heparin Unfractionated: 0.26 [IU]/mL — ABNORMAL LOW (ref 0.30–0.70)

## 2023-06-20 LAB — CBC
HCT: 26 % — ABNORMAL LOW (ref 39.0–52.0)
Hemoglobin: 8.3 g/dL — ABNORMAL LOW (ref 13.0–17.0)
MCH: 27.6 pg (ref 26.0–34.0)
MCHC: 31.9 g/dL (ref 30.0–36.0)
MCV: 86.4 fL (ref 80.0–100.0)
Platelets: 376 10*3/uL (ref 150–400)
RBC: 3.01 MIL/uL — ABNORMAL LOW (ref 4.22–5.81)
RDW: 18.6 % — ABNORMAL HIGH (ref 11.5–15.5)
WBC: 15.6 10*3/uL — ABNORMAL HIGH (ref 4.0–10.5)
nRBC: 0.3 % — ABNORMAL HIGH (ref 0.0–0.2)

## 2023-06-20 LAB — MAGNESIUM: Magnesium: 2.1 mg/dL (ref 1.7–2.4)

## 2023-06-20 LAB — TRIGLYCERIDES: Triglycerides: 158 mg/dL — ABNORMAL HIGH (ref <150)

## 2023-06-20 LAB — GLUCOSE, CAPILLARY
Glucose-Capillary: 131 mg/dL — ABNORMAL HIGH (ref 70–99)
Glucose-Capillary: 140 mg/dL — ABNORMAL HIGH (ref 70–99)
Glucose-Capillary: 156 mg/dL — ABNORMAL HIGH (ref 70–99)
Glucose-Capillary: 138 mg/dL — ABNORMAL HIGH (ref 70–99)
Glucose-Capillary: 147 mg/dL — ABNORMAL HIGH (ref 70–99)

## 2023-06-20 MED ORDER — ACETAMINOPHEN 325 MG PO TABS
650.0000 mg | ORAL_TABLET | Freq: Four times a day (QID) | ORAL | Status: DC
  Administered 2023-06-20 – 2023-07-21 (×112): 650 mg
  Filled 2023-06-20 (×113): qty 2

## 2023-06-20 MED ORDER — SODIUM CHLORIDE 0.9 % IV SOLN
2.0000 g | Freq: Three times a day (TID) | INTRAVENOUS | Status: DC
  Administered 2023-06-20 – 2023-06-24 (×12): 2 g via INTRAVENOUS
  Filled 2023-06-20 (×12): qty 12.5

## 2023-06-20 MED ORDER — SPIRONOLACTONE 25 MG PO TABS
25.0000 mg | ORAL_TABLET | Freq: Every day | ORAL | Status: DC
  Administered 2023-06-20 – 2023-07-12 (×23): 25 mg
  Filled 2023-06-20 (×23): qty 1

## 2023-06-20 MED ORDER — ASPIRIN 81 MG PO CHEW
81.0000 mg | CHEWABLE_TABLET | Freq: Every day | ORAL | Status: DC
  Administered 2023-06-20 – 2023-06-21 (×2): 81 mg
  Filled 2023-06-20 (×3): qty 1

## 2023-06-20 MED ORDER — CLONAZEPAM 0.5 MG PO TABS
0.5000 mg | ORAL_TABLET | Freq: Two times a day (BID) | ORAL | Status: DC
  Administered 2023-06-20 (×2): 0.5 mg
  Filled 2023-06-20 (×2): qty 1

## 2023-06-20 MED ORDER — TRACE MINERALS CU-MN-SE-ZN 300-55-60-3000 MCG/ML IV SOLN
INTRAVENOUS | Status: AC
  Filled 2023-06-20: qty 1139.2

## 2023-06-20 MED ORDER — MIDAZOLAM HCL 2 MG/2ML IJ SOLN
2.0000 mg | Freq: Once | INTRAMUSCULAR | Status: AC
  Administered 2023-06-20: 2 mg via INTRAVENOUS

## 2023-06-20 MED ORDER — METOPROLOL TARTRATE 50 MG PO TABS
50.0000 mg | ORAL_TABLET | Freq: Two times a day (BID) | ORAL | Status: DC
  Administered 2023-06-20 – 2023-06-22 (×5): 50 mg
  Filled 2023-06-20 (×5): qty 1

## 2023-06-20 MED ORDER — FUROSEMIDE 10 MG/ML IJ SOLN
40.0000 mg | Freq: Once | INTRAMUSCULAR | Status: AC
  Administered 2023-06-20: 40 mg via INTRAVENOUS
  Filled 2023-06-20: qty 4

## 2023-06-20 MED ORDER — MIDAZOLAM HCL 2 MG/2ML IJ SOLN
INTRAMUSCULAR | Status: AC
  Filled 2023-06-20: qty 2

## 2023-06-20 MED ORDER — IRBESARTAN 150 MG PO TABS
150.0000 mg | ORAL_TABLET | Freq: Every day | ORAL | Status: DC
  Administered 2023-06-20 – 2023-07-08 (×19): 150 mg
  Filled 2023-06-20 (×19): qty 1

## 2023-06-20 MED ORDER — QUETIAPINE FUMARATE 25 MG PO TABS
50.0000 mg | ORAL_TABLET | Freq: Two times a day (BID) | ORAL | Status: DC
  Administered 2023-06-20 (×2): 50 mg
  Filled 2023-06-20 (×2): qty 2

## 2023-06-20 NOTE — Progress Notes (Signed)
OT Cancellation Note  Patient Details Name: Russell Garcia MRN: [id number] DOB: 29-Sep-1956   Cancelled Treatment:    Reason Eval/Treat Not Completed: Medical issues which prohibited therapy Patient re-intubated over the weekend, and remains sedated. OT will follow back when patient is medically appropriate.   Pollyann Glen E. Eleanna Theilen, OTR/L Acute Rehabilitation Services [phone]   Cherlyn Cushing 06/20/2023, 8:18 AM
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
## 2023-06-20 NOTE — Progress Notes (Signed)
PT Cancellation Note  Patient Details Name: Russell Garcia MRN: [id number] DOB: 11/11/1955   Cancelled Treatment:    Reason Eval/Treat Not Completed: Medical issues which prohibited therapy. Pt required transfer to ICU and re-intubation 8/23. Pt remains intubated and sedated.    Ilda Foil 06/20/2023, 8:36 AM

## 2023-06-21 ENCOUNTER — Inpatient Hospital Stay (HOSPITAL_COMMUNITY): Payer: Medicare HMO

## 2023-06-21 DIAGNOSIS — J9690 Respiratory failure, unspecified, unspecified whether with hypoxia or hypercapnia: Secondary | ICD-10-CM | POA: Diagnosis not present

## 2023-06-21 DIAGNOSIS — Z4682 Encounter for fitting and adjustment of non-vascular catheter: Secondary | ICD-10-CM | POA: Diagnosis not present

## 2023-06-21 DIAGNOSIS — R918 Other nonspecific abnormal finding of lung field: Secondary | ICD-10-CM | POA: Diagnosis not present

## 2023-06-21 LAB — CBC
HCT: 23.5 % — ABNORMAL LOW (ref 39.0–52.0)
Hemoglobin: 7 g/dL — ABNORMAL LOW (ref 13.0–17.0)
MCH: 26.5 pg (ref 26.0–34.0)
MCHC: 29.8 g/dL — ABNORMAL LOW (ref 30.0–36.0)
MCV: 89 fL (ref 80.0–100.0)
Platelets: 349 10*3/uL (ref 150–400)
RBC: 2.64 MIL/uL — ABNORMAL LOW (ref 4.22–5.81)
RDW: 18.9 % — ABNORMAL HIGH (ref 11.5–15.5)
WBC: 13.1 10*3/uL — ABNORMAL HIGH (ref 4.0–10.5)
nRBC: 0.2 % (ref 0.0–0.2)

## 2023-06-21 LAB — PHOSPHORUS: Phosphorus: 3.4 mg/dL (ref 2.5–4.6)

## 2023-06-21 LAB — MAGNESIUM: Magnesium: 2.2 mg/dL (ref 1.7–2.4)

## 2023-06-21 LAB — HEPARIN LEVEL (UNFRACTIONATED)
Heparin Unfractionated: 0.33 [IU]/mL (ref 0.30–0.70)
Heparin Unfractionated: 0.23 [IU]/mL — ABNORMAL LOW (ref 0.30–0.70)
Heparin Unfractionated: 0.32 [IU]/mL (ref 0.30–0.70)

## 2023-06-21 LAB — BASIC METABOLIC PANEL
Anion gap: 8 (ref 5–15)
BUN: 43 mg/dL — ABNORMAL HIGH (ref 8–23)
CO2: 24 mmol/L (ref 22–32)
Calcium: 8 mg/dL — ABNORMAL LOW (ref 8.9–10.3)
Chloride: 109 mmol/L (ref 98–111)
Creatinine, Ser: 1.11 mg/dL (ref 0.61–1.24)
GFR, Estimated: >60 mL/min (ref >60)
Glucose, Bld: 153 mg/dL — ABNORMAL HIGH (ref 70–99)
Potassium: 3.9 mmol/L (ref 3.5–5.1)
Sodium: 141 mmol/L (ref 135–145)

## 2023-06-21 LAB — GLUCOSE, CAPILLARY
Glucose-Capillary: 143 mg/dL — ABNORMAL HIGH (ref 70–99)
Glucose-Capillary: 145 mg/dL — ABNORMAL HIGH (ref 70–99)
Glucose-Capillary: 144 mg/dL — ABNORMAL HIGH (ref 70–99)

## 2023-06-21 LAB — CULTURE, RESPIRATORY W GRAM STAIN
Culture: NORMAL
Gram Stain: NONE SEEN
Special Requests: NORMAL

## 2023-06-21 MED ORDER — FUROSEMIDE 10 MG/ML IJ SOLN
40.0000 mg | Freq: Once | INTRAMUSCULAR | Status: AC
  Administered 2023-06-21: 40 mg via INTRAVENOUS
  Filled 2023-06-21: qty 4

## 2023-06-21 MED ORDER — INSULIN ASPART 100 UNIT/ML IJ SOLN
0.0000 [IU] | Freq: Three times a day (TID) | INTRAMUSCULAR | Status: DC
  Administered 2023-06-21 – 2023-06-26 (×14): 1 [IU] via SUBCUTANEOUS

## 2023-06-21 MED ORDER — TRACE MINERALS CU-MN-SE-ZN 300-55-60-3000 MCG/ML IV SOLN
INTRAVENOUS | Status: AC
  Filled 2023-06-21: qty 1139.2

## 2023-06-21 MED ORDER — MIDAZOLAM HCL 2 MG/2ML IJ SOLN
INTRAMUSCULAR | Status: AC
  Filled 2023-06-21: qty 2

## 2023-06-21 MED ORDER — ALBUTEROL SULFATE (2.5 MG/3ML) 0.083% IN NEBU
INHALATION_SOLUTION | RESPIRATORY_TRACT | Status: AC
  Administered 2023-06-21: 2.5 mg
  Filled 2023-06-21: qty 3

## 2023-06-21 MED ORDER — CLONAZEPAM 1 MG PO TABS
1.0000 mg | ORAL_TABLET | Freq: Two times a day (BID) | ORAL | Status: DC
  Administered 2023-06-21 – 2023-07-05 (×26): 1 mg
  Filled 2023-06-21 (×26): qty 1

## 2023-06-21 MED ORDER — QUETIAPINE FUMARATE 100 MG PO TABS
100.0000 mg | ORAL_TABLET | Freq: Two times a day (BID) | ORAL | Status: DC
  Administered 2023-06-21 – 2023-07-10 (×37): 100 mg
  Filled 2023-06-21 (×38): qty 1

## 2023-06-21 MED ORDER — ALBUTEROL SULFATE (2.5 MG/3ML) 0.083% IN NEBU
INHALATION_SOLUTION | RESPIRATORY_TRACT | Status: AC
  Filled 2023-06-21: qty 3

## 2023-06-21 MED ORDER — POTASSIUM CHLORIDE 10 MEQ/50ML IV SOLN
10.0000 meq | INTRAVENOUS | Status: AC
  Administered 2023-06-21 (×4): 10 meq via INTRAVENOUS
  Filled 2023-06-21 (×4): qty 50

## 2023-06-21 MED ORDER — MIDAZOLAM HCL 2 MG/2ML IJ SOLN
2.0000 mg | Freq: Once | INTRAMUSCULAR | Status: AC
  Administered 2023-06-21: 2 mg via INTRAVENOUS
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
## 2023-06-21 NOTE — Progress Notes (Signed)
RN was notified by phlebotomy that pt was difficult lab stick due to edema and that phlebotomy tech was unable to draw afternoon heparin level. This RN gave tech permission to attempt stick on same extremity as PICC line. PICC in left upper arm, tech stuck pt in right hand. Labs resulted within therapeutic range.   However, RN advised by pharmacy that if unable to draw labs from opposite extremity, pause heparin gtt for 10 minutes and collect heparin level from PICC line. *See new nursing orders from pharmacy.

## 2023-06-21 NOTE — Plan of Care (Signed)
  Problem: Clinical Measurements: Goal: Ability to maintain clinical measurements within normal limits will improve Outcome: Progressing Goal: Will remain free from infection Outcome: Progressing Goal: Diagnostic test results will improve Outcome: Progressing   Problem: Nutrition: Goal: Adequate nutrition will be maintained Outcome: Progressing   Problem: Elimination: Goal: Will not experience complications related to urinary retention Outcome: Progressing   Problem: Safety: Goal: Ability to remain free from injury will improve Outcome: Progressing   Problem: Skin Integrity: Goal: Risk for impaired skin integrity will decrease Outcome: Progressing   Problem: Safety: Goal: Non-violent Restraint(s) Outcome: Progressing   Problem: Health Behavior/Discharge Planning: Goal: Ability to manage health-related needs will improve Outcome: Not Progressing   Problem: Clinical Measurements: Goal: Respiratory complications will improve Outcome: Not Progressing Goal: Cardiovascular complication will be avoided Outcome: Not Progressing   Problem: Activity: Goal: Risk for activity intolerance will decrease Outcome: Not Progressing   Problem: Coping: Goal: Level of anxiety will decrease Outcome: Not Progressing   Problem: Elimination: Goal: Will not experience complications related to bowel motility Outcome: Not Progressing   Problem: Pain Managment: Goal: General experience of comfort will improve Outcome: Not Progressing

## 2023-06-21 NOTE — Progress Notes (Signed)
PT Cancellation Note  Patient Details Name: Russell Garcia MRN: [id number] DOB: 1955-11-03   Cancelled Treatment:    Reason Eval/Treat Not Completed: Medical issues which prohibited therapy (still weaning, becomes agitated with all mvmt per RN). Will check again tomorrow.   Lyanne Co, PT  Acute Rehab Services Secure chat preferred Office [phone]    Elyse Hsu 06/21/2023, 2:39 PM

## 2023-06-21 NOTE — Progress Notes (Signed)
Pt vent alarming due to agitation and biting ETTube. Bite block placed over the next 10 mins w/increased sedation. ETTube secured w/27cm at top of bite block. Pt continued to high pressure alarm and O2 increased to 100%. BS diminished/absent and exp prolonged. Albuterol neb tx given w/increase in audible air movement bilat but remain diminished w/slight wheeze. Second dose given; peak pressures dropped and BS returned to clear and distant. O2 was weaned back down as tolerated.
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
## 2023-06-21 NOTE — Progress Notes (Signed)
OT Cancellation Note  Patient Details Name: SABASTION DEAN MRN: [id number] DOB: 1956-07-13   Cancelled Treatment:    Reason Eval/Treat Not Completed: Medical issues which prohibited therapy- spoke to RN, reports increased agitation with respiratory related activity.  Will hold OT today, and check back tomorrow.   [name], OT Acute Rehabilitation Services Office [phone]   [name] 06/21/2023, 1:08 PM

## 2023-06-21 NOTE — Progress Notes (Signed)
   06/21/23 0749  Vent Select  Invasive or Noninvasive Invasive  Adult Vent Y  Airway 8 mm  Placement Date/Time: 06/17/23 1415   Grade View: Grade 1  Placed By: ICU physician  Airway Device: Endotracheal Tube  Laryngoscope Blade: 3  ETT Types: Oral  Size (mm): 8 mm  Cuffed: Cuffed  Insertion attempts: 1  Airway Equipment: Video Laryngoscope;...  Secured at (cm) 27 cm  Measured From Lips  Secured Location Right  Secured By [employer] technologist Repositioned Yes  Cuff Pressure (cm H2O) Green OR 18-26 CmH2O  Site Condition Cool;Dry  Adult Ventilator Settings  Vent Type Servo i  Humidity HME  Vent Mode (S)  PSV;CPAP (Found on PSV/CPAP)  FiO2 (%) 40 %  Pressure Support 5 cmH20  CPAP 5 cmH20  Adult Ventilator Measurements  Peak Airway Pressure 11 L/min  Mean Airway Pressure 7 cmH20  Resp Rate Spontaneous 40 br/min  Resp Rate Total 40 br/min  Spont TV 541 mL  Measured Ve 17.4 L  I:E Ratio Measured 1:1.4  Total PEEP 5 cmH20  Adult Ventilator Alarms  Alarms On Y  Ve High Alarm 24 L/min  Ve Low Alarm 4 L/min  Resp Rate High Alarm 50 br/min  Resp Rate Low Alarm 8  PEEP Low Alarm 2 cmH2O  Press High Alarm 50 cmH2O  T Apnea 20 sec(s)  VAP Prevention  HOB> 30 Degrees Y  Daily Weaning Assessment  Daily Assessment of Readiness to Wean (S)  Wean protocol criteria met (SBT performed) (Weaning initiated by Dr. Bedelia Person)  SBT Method (S)  CPAP 5 cm H20 and PS 5 cm H20  Weaning Start Time (S)  0700  Patient response (S)  Failed SBT terminated  Reason SBT Terminated (S)  RR > 35 breaths/min or Frequency/TV ratio >105;HR > 130 beats/min or sustained change in HR of 20%;Excessive agitation, marked accessory muscle use, abdominal paradox, or diaphoresis noted  Breath Sounds  Bilateral Breath Sounds Rhonchi;Diminished  R Upper  Breath Sounds Rhonchi  L Upper Breath Sounds Rhonchi  R Lower Breath Sounds Diminished  L Lower Breath Sounds Diminished  Vent Respiratory Assessment   Level of Consciousness Responds to Voice  Respiratory Pattern Regular;Unlabored  Oral Suctioning/Secretions  Suction Type Oral  Suction Device [place]  Secretion Amount Small  Secretion Color Clear  Secretion Consistency Thin  Suction Tolerance Tolerated well  Suctioning Adverse Effects None  Airway Suctioning/Secretions  Suction Type ETT  Suction Device  Catheter  Secretion Amount Moderate  Secretion Color Tan;Pink tinged  Secretion Consistency Thick  Suction Tolerance Tolerated well  Suctioning Adverse Effects None
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
## 2023-06-21 NOTE — Progress Notes (Signed)
ANTICOAGULATION CONSULT NOTE Pharmacy Consult for heparin Indication: pulmonary embolus Brief A/P: Heparin level within goal range Continue Heparin at current rate   No Known Allergies  Patient Measurements: Height: 6' (182.9 cm) Weight: (!) 141.7 kg (312 lb 6.3 oz) IBW/kg (Calculated) : 77.6 Heparin Dosing Weight: 110 kg  Vital Signs: Temp: 99.1 F (37.3 C) (08/27 0000) Temp Source: Axillary (08/27 0000) BP: 226/117 (08/27 0200) Pulse Rate: 125 (08/27 0200)  Labs: Recent Labs    06/18/23 0444 06/18/23 0444 06/18/23 1746 06/19/23 0016 06/19/23 0846 06/20/23 0825 06/20/23 1005 06/20/23 1320 06/20/23 1758 06/21/23 0148  HGB 6.7*  --  7.8*  --  7.6* 8.3*  --   --   --   --   HCT 21.6*  --  24.5*  --  24.7* 26.0*  --   --   --   --   PLT 315  --   --   --  367 376  --   --   --   --   APTT  --   --  129*  --   --   --   --   --   --   --   HEPARINUNFRC  --    < > 0.67 0.61  --  0.23*   < > 0.24* 0.26* 0.33  CREATININE 2.00*  --   --  1.20  --  1.17  --   --   --   --    < > = values in this interval not displayed.    Estimated Creatinine Clearance: 90.7 mL/min (by C-G formula based on SCr of 1.17 mg/dL).   Assessment: 67 y.o. male with PE for heparin  Goal of Therapy:  Heparin level 0.3-0.7 units/ml Monitor platelets by anticoagulation protocol: Yes   Plan:  No change to heparin  Geannie Risen, PharmD, BCPS  06/21/2023,2:50 AM
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
## 2023-06-22 LAB — CULTURE, BLOOD (ROUTINE X 2)
Culture: NO GROWTH
Culture: NO GROWTH
Special Requests: ADEQUATE

## 2023-06-22 LAB — CBC
HCT: UNDETERMINED % (ref 39.0–52.0)
HCT: 23.3 % — ABNORMAL LOW (ref 39.0–52.0)
Hemoglobin: UNDETERMINED g/dL (ref 13.0–17.0)
Hemoglobin: 7.1 g/dL — ABNORMAL LOW (ref 13.0–17.0)
MCH: UNDETERMINED pg (ref 26.0–34.0)
MCH: 26.8 pg (ref 26.0–34.0)
MCHC: UNDETERMINED g/dL (ref 30.0–36.0)
MCHC: 30.5 g/dL (ref 30.0–36.0)
MCV: UNDETERMINED fL (ref 80.0–100.0)
MCV: 87.9 fL (ref 80.0–100.0)
Platelets: UNDETERMINED 10*3/uL (ref 150–400)
Platelets: 354 10*3/uL (ref 150–400)
RBC: UNDETERMINED MIL/uL (ref 4.22–5.81)
RBC: 2.65 MIL/uL — ABNORMAL LOW (ref 4.22–5.81)
RDW: UNDETERMINED % (ref 11.5–15.5)
RDW: 18.6 % — ABNORMAL HIGH (ref 11.5–15.5)
WBC: UNDETERMINED 10*3/uL (ref 4.0–10.5)
WBC: 11.7 10*3/uL — ABNORMAL HIGH (ref 4.0–10.5)
nRBC: UNDETERMINED % (ref 0.0–0.2)
nRBC: 0.2 % (ref 0.0–0.2)

## 2023-06-22 LAB — HEPARIN LEVEL (UNFRACTIONATED): Heparin Unfractionated: 0.21 [IU]/mL — ABNORMAL LOW (ref 0.30–0.70)

## 2023-06-22 LAB — BPAM RBC
Blood Product Expiration Date: 202409222359
ISSUE DATE / TIME: 202408282331
Unit Type and Rh: 6200

## 2023-06-22 LAB — TYPE AND SCREEN
ABO/RH(D): A POS
Antibody Screen: NEGATIVE
Unit division: 0

## 2023-06-22 LAB — BASIC METABOLIC PANEL
Anion gap: 11 (ref 5–15)
BUN: 43 mg/dL — ABNORMAL HIGH (ref 8–23)
CO2: 25 mmol/L (ref 22–32)
Calcium: 8.4 mg/dL — ABNORMAL LOW (ref 8.9–10.3)
Chloride: 106 mmol/L (ref 98–111)
Creatinine, Ser: 1.06 mg/dL (ref 0.61–1.24)
GFR, Estimated: >60 mL/min (ref >60)
Glucose, Bld: 141 mg/dL — ABNORMAL HIGH (ref 70–99)
Potassium: 4.3 mmol/L (ref 3.5–5.1)
Sodium: 142 mmol/L (ref 135–145)

## 2023-06-22 LAB — GLUCOSE, CAPILLARY
Glucose-Capillary: 121 mg/dL — ABNORMAL HIGH (ref 70–99)
Glucose-Capillary: 141 mg/dL — ABNORMAL HIGH (ref 70–99)
Glucose-Capillary: 114 mg/dL — ABNORMAL HIGH (ref 70–99)

## 2023-06-22 LAB — PREPARE RBC (CROSSMATCH)

## 2023-06-22 MED ORDER — SODIUM CHLORIDE 0.9 % IV SOLN: INTRAVENOUS | Status: DC | PRN

## 2023-06-22 MED ORDER — ENOXAPARIN SODIUM 150 MG/ML IJ SOSY: 1.0000 mg/kg | PREFILLED_SYRINGE | Freq: Two times a day (BID) | INTRAMUSCULAR | Status: DC

## 2023-06-22 MED ORDER — FUROSEMIDE 10 MG/ML IJ SOLN
INTRAMUSCULAR | Status: AC
  Filled 2023-06-22: qty 4

## 2023-06-22 MED ORDER — ASPIRIN 300 MG RE SUPP
150.0000 mg | Freq: Every day | RECTAL | Status: DC
  Administered 2023-06-22 – 2023-06-24 (×3): 150 mg via RECTAL
  Filled 2023-06-22 (×4): qty 1

## 2023-06-22 MED ORDER — HALOPERIDOL LACTATE 5 MG/ML IJ SOLN
5.0000 mg | Freq: Four times a day (QID) | INTRAMUSCULAR | Status: AC
  Administered 2023-06-22 – 2023-06-24 (×8): 5 mg via INTRAVENOUS
  Filled 2023-06-22 (×8): qty 1

## 2023-06-22 MED ORDER — TRACE MINERALS CU-MN-SE-ZN 300-55-60-3000 MCG/ML IV SOLN
INTRAVENOUS | Status: AC
  Filled 2023-06-22: qty 1139.2

## 2023-06-22 MED ORDER — NICARDIPINE HCL IN NACL 20-0.86 MG/200ML-% IV SOLN
3.0000 mg/h | INTRAVENOUS | Status: DC
  Administered 2023-06-22: 5 mg/h via INTRAVENOUS
  Administered 2023-06-23: 12.5 mg/h via INTRAVENOUS
  Administered 2023-06-23: 10 mg/h via INTRAVENOUS
  Administered 2023-06-24: 12.5 mg/h via INTRAVENOUS
  Administered 2023-06-24: 3 mg/h via INTRAVENOUS
  Administered 2023-06-26 – 2023-06-27 (×2): 5 mg/h via INTRAVENOUS
  Administered 2023-06-27: 15 mg/h via INTRAVENOUS
  Administered 2023-06-27 (×2): 5 mg/h via INTRAVENOUS
  Administered 2023-06-28: 15 mg/h via INTRAVENOUS
  Administered 2023-06-28: 13 mg/h via INTRAVENOUS
  Administered 2023-06-28: 10 mg/h via INTRAVENOUS
  Administered 2023-06-28: 5 mg/h via INTRAVENOUS
  Administered 2023-06-28: 10 mg/h via INTRAVENOUS
  Administered 2023-06-28: 5 mg/h via INTRAVENOUS
  Administered 2023-06-29: 8.5 mg/h via INTRAVENOUS
  Administered 2023-06-29: 10 mg/h via INTRAVENOUS
  Administered 2023-06-29: 5 mg/h via INTRAVENOUS
  Administered 2023-06-30: 12.5 mg/h via INTRAVENOUS
  Administered 2023-06-30: 7.5 mg/h via INTRAVENOUS
  Administered 2023-06-30: 10 mg/h via INTRAVENOUS
  Administered 2023-06-30: 3 mg/h via INTRAVENOUS
  Filled 2023-06-22 (×2): qty 200
  Filled 2023-06-22: qty 400
  Filled 2023-06-22 (×5): qty 200
  Filled 2023-06-22: qty 400
  Filled 2023-06-22 (×11): qty 200

## 2023-06-22 MED ORDER — FUROSEMIDE 10 MG/ML IJ SOLN
60.0000 mg | Freq: Four times a day (QID) | INTRAMUSCULAR | Status: AC
  Administered 2023-06-22 (×2): 60 mg via INTRAVENOUS
  Filled 2023-06-22 (×2): qty 6

## 2023-06-22 MED ORDER — BISACODYL 10 MG RE SUPP
10.0000 mg | Freq: Every day | RECTAL | Status: AC
  Administered 2023-06-22 – 2023-06-24 (×3): 10 mg via RECTAL
  Filled 2023-06-22 (×3): qty 1

## 2023-06-22 MED ORDER — PIVOT 1.5 CAL PO LIQD
1000.0000 mL | ORAL | Status: DC
  Administered 2023-06-22 – 2023-06-25 (×2): 1000 mL

## 2023-06-22 MED ORDER — METOPROLOL TARTRATE 5 MG/5ML IV SOLN
5.0000 mg | Freq: Four times a day (QID) | INTRAVENOUS | Status: DC
  Administered 2023-06-22 – 2023-07-07 (×54): 5 mg via INTRAVENOUS
  Filled 2023-06-22 (×55): qty 5

## 2023-06-22 MED ORDER — SODIUM CHLORIDE 0.9% IV SOLUTION: Freq: Once | INTRAVENOUS | Status: AC

## 2023-06-22 MED ORDER — FENTANYL CITRATE (PF) 2500 MCG/50ML IJ SOLN
0.0000 ug/h | Status: DC
  Administered 2023-06-22: 175 ug/h via INTRAVENOUS
  Administered 2023-06-23: 50 ug/h via INTRAVENOUS
  Filled 2023-06-22 (×2): qty 100

## 2023-06-22 MED ORDER — ENOXAPARIN SODIUM 150 MG/ML IJ SOSY
140.0000 mg | PREFILLED_SYRINGE | Freq: Two times a day (BID) | INTRAMUSCULAR | Status: DC
  Administered 2023-06-22 – 2023-06-24 (×5): 140 mg via SUBCUTANEOUS
  Filled 2023-06-22 (×6): qty 0.94

## 2023-06-22 MED ORDER — VITAL HIGH PROTEIN PO LIQD: 1000.0000 mL | ORAL | Status: DC

## 2023-06-22 MED ORDER — FUROSEMIDE 10 MG/ML IJ SOLN
60.0000 mg | Freq: Once | INTRAMUSCULAR | Status: AC
  Administered 2023-06-22: 60 mg via INTRAVENOUS
  Filled 2023-06-22: qty 6

## 2023-06-22 NOTE — Telephone Encounter (Signed)
Pt currently in hospital.

## 2023-06-22 NOTE — Progress Notes (Signed)
OT Cancellation Note  Patient Details Name: Russell Garcia MRN: [id number] DOB: [birth year]   Cancelled Treatment:    Reason Eval/Treat Not Completed: Medical issues which prohibited therapy Medical issues which prohibited therapy. Pt intubated and restless per RN. HR in 130s and BP 217/92 at rest. OT will follow back when medically appropriate.  Pollyann Glen E. Cuthbert Turton, OTR/L Acute Rehabilitation Services [phone]   Cherlyn Cushing 06/22/2023, 9:03 AM

## 2023-06-22 NOTE — Plan of Care (Signed)
  Problem: Clinical Measurements: Goal: Ability to maintain clinical measurements within normal limits will improve Outcome: Progressing Goal: Will remain free from infection Outcome: Progressing   Problem: Activity: Goal: Risk for activity intolerance will decrease Outcome: Progressing   Problem: Coping: Goal: Level of anxiety will decrease Outcome: Progressing   

## 2023-06-22 NOTE — Procedures (Signed)
   Procedure Note  Date: 06/22/2023  Procedure: arterial line placement--left, radial artery, with ultrasound guidance  Pre-op diagnosis: need for invasive blood pressure monitoring Post-op diagnosis: same  Surgeon: Diamantina Monks, MD  Anesthesia: none  EBL: <5cc Drains/Implants: 4 cm arterial catheter  Description of procedure: Time-out was performed verifying correct patient, procedure, site, laterality, and signature of informed consent. The left wrist was prepped and draped in the usual sterile fashion. The artery was localized with ultrasound guidance and accessed using an arterial catheterization kit after 2 attempt(s). The wire was advanced and the sheath advanced over the wire. The needle and wire were removed with pulsatile, bright red blood noted through the catheter. The catheter was connected to a transducer and a good waveform was noted. The catheter was secured in place with suture and tegaderm. The patient tolerated the procedure well. There were no complications.    Diamantina Monks, MD General and Trauma Surgery [facility]
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
## 2023-06-22 NOTE — Progress Notes (Signed)
PT Cancellation Note  Patient Details Name: Russell Garcia MRN: [id number] DOB: 16-Sep-1956   Cancelled Treatment:    Reason Eval/Treat Not Completed: (P) Medical issues which prohibited therapy. Pt intubated and restless per RN. HR in 130s and BP 217/92 at rest. Will plan to hold off on PT today and follow-up another day as able.   Raymond Gurney, PT, DPT Acute Rehabilitation Services  Office: [phone]    Jewel Baize 06/22/2023, 8:48 AM

## 2023-06-23 LAB — COMPREHENSIVE METABOLIC PANEL
ALT: 34 U/L (ref 0–44)
AST: 34 U/L (ref 15–41)
Albumin: 2 g/dL — ABNORMAL LOW (ref 3.5–5.0)
Alkaline Phosphatase: 62 U/L (ref 38–126)
Anion gap: 11 (ref 5–15)
BUN: 44 mg/dL — ABNORMAL HIGH (ref 8–23)
CO2: 25 mmol/L (ref 22–32)
Calcium: 8.4 mg/dL — ABNORMAL LOW (ref 8.9–10.3)
Chloride: 107 mmol/L (ref 98–111)
Creatinine, Ser: 1.25 mg/dL — ABNORMAL HIGH (ref 0.61–1.24)
GFR, Estimated: >60 mL/min (ref >60)
Glucose, Bld: 151 mg/dL — ABNORMAL HIGH (ref 70–99)
Potassium: 3.9 mmol/L (ref 3.5–5.1)
Sodium: 143 mmol/L (ref 135–145)
Total Bilirubin: 0.8 mg/dL (ref 0.3–1.2)
Total Protein: 6 g/dL — ABNORMAL LOW (ref 6.5–8.1)

## 2023-06-23 LAB — MAGNESIUM: Magnesium: 2 mg/dL (ref 1.7–2.4)

## 2023-06-23 LAB — PHOSPHORUS: Phosphorus: 3.3 mg/dL (ref 2.5–4.6)

## 2023-06-23 LAB — GLUCOSE, CAPILLARY
Glucose-Capillary: 127 mg/dL — ABNORMAL HIGH (ref 70–99)
Glucose-Capillary: 141 mg/dL — ABNORMAL HIGH (ref 70–99)
Glucose-Capillary: 138 mg/dL — ABNORMAL HIGH (ref 70–99)

## 2023-06-23 LAB — CBC
HCT: 23.8 % — ABNORMAL LOW (ref 39.0–52.0)
Hemoglobin: 7.4 g/dL — ABNORMAL LOW (ref 13.0–17.0)
MCH: 27.7 pg (ref 26.0–34.0)
MCHC: 31.1 g/dL (ref 30.0–36.0)
MCV: 89.1 fL (ref 80.0–100.0)
Platelets: 327 10*3/uL (ref 150–400)
RBC: 2.67 MIL/uL — ABNORMAL LOW (ref 4.22–5.81)
RDW: 18.3 % — ABNORMAL HIGH (ref 11.5–15.5)
WBC: 11.3 10*3/uL — ABNORMAL HIGH (ref 4.0–10.5)
nRBC: 0 % (ref 0.0–0.2)

## 2023-06-23 LAB — BPAM RBC

## 2023-06-23 LAB — TYPE AND SCREEN

## 2023-06-23 MED ORDER — ALBUMIN HUMAN 5 % IV SOLN
INTRAVENOUS | Status: AC
  Administered 2023-06-23: 12.5 g via INTRAVENOUS
  Filled 2023-06-23: qty 250

## 2023-06-23 MED ORDER — ALBUMIN HUMAN 5 % IV SOLN: 12.5000 g | Freq: Once | INTRAVENOUS | Status: AC

## 2023-06-23 MED ORDER — IPRATROPIUM-ALBUTEROL 0.5-2.5 (3) MG/3ML IN SOLN
3.0000 mL | RESPIRATORY_TRACT | Status: DC | PRN
  Administered 2023-06-25 – 2023-07-17 (×3): 3 mL via RESPIRATORY_TRACT
  Filled 2023-06-23 (×3): qty 3

## 2023-06-23 MED ORDER — TRACE MINERALS CU-MN-SE-ZN 300-55-60-3000 MCG/ML IV SOLN
INTRAVENOUS | Status: AC
  Filled 2023-06-23: qty 1139.2

## 2023-06-23 NOTE — Progress Notes (Signed)
   Inpatient Rehab Admissions Coordinator :  Per therapy recommendations patient was screened for CIR candidacy by Ottie Glazier RN MSN. Noted he is intubated. The CIR admissions team will follow and monitor for progress and place a Rehab Consult order if felt to be appropriate. Please contact me with any questions.  Ottie Glazier RN MSN Admissions Coordinator [phone]
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
## 2023-06-23 NOTE — Progress Notes (Signed)
   06/23/23 0744  Daily Weaning Assessment  Daily Assessment of Readiness to Wean Wean protocol criteria met (SBT performed)  SBT Method CPAP 5 cm H20 and PS 5 cm H20 (PS8)  Weaning Start Time 0744   Pt was placed on wean 8/5 40% with increased agitation and hypertension. RN aware at bedside.

## 2023-06-23 NOTE — Progress Notes (Signed)
Notified by blood bank that 1u PRBCs was ready. Order was placed at 0911, most recent Hgb 7.1 and the blood order said to infuse for a Hgb less that 7. Dr. Cliffton Asters aware and would like the blood to be given.   Care ongoing.
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
## 2023-06-23 NOTE — Progress Notes (Signed)
   06/23/23 0833  Adult Ventilator Settings  Vent Type Servo i  Humidity HME  Vent Mode (S)  PRVC  Vt Set 620 mL  Set Rate 18 bmp  FiO2 (%) 40 %  I Time 0.8 Sec(s)  PEEP 5 cmH20    Pt was placed back on full support. No plan on extubation today per Trauma MD

## 2023-06-24 LAB — CBC
HCT: 24.7 % — ABNORMAL LOW (ref 39.0–52.0)
Hemoglobin: 7.5 g/dL — ABNORMAL LOW (ref 13.0–17.0)
MCH: 26.5 pg (ref 26.0–34.0)
MCHC: 30.4 g/dL (ref 30.0–36.0)
MCV: 87.3 fL (ref 80.0–100.0)
Platelets: 341 10*3/uL (ref 150–400)
RBC: 2.83 MIL/uL — ABNORMAL LOW (ref 4.22–5.81)
RDW: 18.2 % — ABNORMAL HIGH (ref 11.5–15.5)
WBC: 11.8 10*3/uL — ABNORMAL HIGH (ref 4.0–10.5)
nRBC: 0 % (ref 0.0–0.2)

## 2023-06-24 LAB — BASIC METABOLIC PANEL
Anion gap: 12 (ref 5–15)
BUN: 51 mg/dL — ABNORMAL HIGH (ref 8–23)
CO2: 26 mmol/L (ref 22–32)
Calcium: 8.6 mg/dL — ABNORMAL LOW (ref 8.9–10.3)
Chloride: 107 mmol/L (ref 98–111)
Creatinine, Ser: 1.06 mg/dL (ref 0.61–1.24)
GFR, Estimated: >60 mL/min (ref >60)
Glucose, Bld: 149 mg/dL — ABNORMAL HIGH (ref 70–99)
Potassium: 4.1 mmol/L (ref 3.5–5.1)
Sodium: 145 mmol/L (ref 135–145)

## 2023-06-24 LAB — HEPARIN ANTI-XA: Heparin LMW: 1.09 IU/mL

## 2023-06-24 LAB — GLUCOSE, CAPILLARY
Glucose-Capillary: 144 mg/dL — ABNORMAL HIGH (ref 70–99)
Glucose-Capillary: 122 mg/dL — ABNORMAL HIGH (ref 70–99)
Glucose-Capillary: 136 mg/dL — ABNORMAL HIGH (ref 70–99)

## 2023-06-24 MED ORDER — ENOXAPARIN SODIUM 120 MG/0.8ML IJ SOSY
120.0000 mg | PREFILLED_SYRINGE | Freq: Two times a day (BID) | INTRAMUSCULAR | Status: DC
  Administered 2023-06-24 – 2023-06-30 (×12): 120 mg via SUBCUTANEOUS
  Filled 2023-06-24 (×14): qty 0.8

## 2023-06-24 MED ORDER — HALOPERIDOL LACTATE 5 MG/ML IJ SOLN: 5.0000 mg | Freq: Four times a day (QID) | INTRAMUSCULAR | Status: DC

## 2023-06-24 MED ORDER — VALPROATE SODIUM 100 MG/ML IV SOLN
500.0000 mg | Freq: Two times a day (BID) | INTRAVENOUS | Status: DC
  Administered 2023-06-24 – 2023-06-27 (×8): 500 mg via INTRAVENOUS
  Filled 2023-06-24 (×9): qty 5

## 2023-06-24 MED ORDER — HALOPERIDOL LACTATE 5 MG/ML IJ SOLN
5.0000 mg | Freq: Four times a day (QID) | INTRAMUSCULAR | Status: AC
  Administered 2023-06-24: 5 mg via INTRAVENOUS
  Filled 2023-06-24: qty 1

## 2023-06-24 MED ORDER — TRACE MINERALS CU-MN-SE-ZN 300-55-60-3000 MCG/ML IV SOLN
INTRAVENOUS | Status: AC
  Filled 2023-06-24: qty 1139.2

## 2023-06-24 MED ORDER — PIVOT 1.5 CAL PO LIQD
1000.0000 mL | ORAL | Status: DC
  Administered 2023-06-29: 1000 mL

## 2023-06-24 NOTE — Progress Notes (Signed)
   06/24/23 1145  Daily Weaning Assessment  Patient response Failed SBT terminated  Reason SBT Terminated SpO2 < 92% with O2 adjustment per protocol;Excessive agitation, marked accessory muscle use, abdominal paradox, or diaphoresis noted   Pt was flipped back to full support due to low Spo2 and increase agitation. RN aware.
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
## 2023-06-24 NOTE — Progress Notes (Signed)
   06/24/23 0747  Daily Weaning Assessment  Daily Assessment of Readiness to Wean Wean protocol criteria met (SBT performed)  SBT Method CPAP 5 cm H20 and PS 5 cm H20 (PS 8)  Weaning Start Time 0747   Pt was placed on wean 8/5 50%. Pt seems to be tolerating fairly well. RT will continue to monitor
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
## 2023-06-25 LAB — BASIC METABOLIC PANEL
Anion gap: 8 (ref 5–15)
BUN: 51 mg/dL — ABNORMAL HIGH (ref 8–23)
CO2: 27 mmol/L (ref 22–32)
Calcium: 8.4 mg/dL — ABNORMAL LOW (ref 8.9–10.3)
Chloride: 109 mmol/L (ref 98–111)
Creatinine, Ser: 1.41 mg/dL — ABNORMAL HIGH (ref 0.61–1.24)
GFR, Estimated: 55 mL/min — ABNORMAL LOW (ref >60)
Glucose, Bld: 137 mg/dL — ABNORMAL HIGH (ref 70–99)
Potassium: 3.7 mmol/L (ref 3.5–5.1)
Sodium: 144 mmol/L (ref 135–145)

## 2023-06-25 LAB — CBC
HCT: 23.5 % — ABNORMAL LOW (ref 39.0–52.0)
Hemoglobin: 7.1 g/dL — ABNORMAL LOW (ref 13.0–17.0)
MCH: 27.4 pg (ref 26.0–34.0)
MCHC: 30.2 g/dL (ref 30.0–36.0)
MCV: 90.7 fL (ref 80.0–100.0)
Platelets: 303 10*3/uL (ref 150–400)
RBC: 2.59 MIL/uL — ABNORMAL LOW (ref 4.22–5.81)
RDW: 18.3 % — ABNORMAL HIGH (ref 11.5–15.5)
WBC: 9.5 10*3/uL (ref 4.0–10.5)
nRBC: 0 % (ref 0.0–0.2)

## 2023-06-25 LAB — GLUCOSE, CAPILLARY
Glucose-Capillary: 125 mg/dL — ABNORMAL HIGH (ref 70–99)
Glucose-Capillary: 134 mg/dL — ABNORMAL HIGH (ref 70–99)
Glucose-Capillary: 140 mg/dL — ABNORMAL HIGH (ref 70–99)

## 2023-06-25 MED ORDER — TRACE MINERALS CU-MN-SE-ZN 300-55-60-3000 MCG/ML IV SOLN
INTRAVENOUS | Status: AC
  Filled 2023-06-25: qty 1139.2

## 2023-06-25 MED ORDER — ALTEPLASE 2 MG IJ SOLR
2.0000 mg | Freq: Once | INTRAMUSCULAR | Status: AC
  Administered 2023-06-25: 2 mg
  Filled 2023-06-25: qty 2

## 2023-06-25 MED ORDER — ASPIRIN 81 MG PO CHEW
81.0000 mg | CHEWABLE_TABLET | Freq: Every day | ORAL | Status: DC
  Administered 2023-06-25 – 2023-07-03 (×9): 81 mg
  Filled 2023-06-25 (×9): qty 1

## 2023-06-25 NOTE — Progress Notes (Signed)
..  Trauma Event Note    Reason for Call :  Notified by primary RN pt had emesis x 2, TF were increased today. TF stopped at this time, placed on LIS, PRN Zofran given. Dr. Dossie Der aware, no new orders at this time.    Last imported Vital Signs BP (!) 159/83   Pulse (!) 108   Temp 99.7 F (37.6 C) (Axillary)   Resp 19   Ht 6' (1.829 m)   Wt (!) 317 lb 14.5 oz (144.2 kg)   SpO2 100%   BMI 43.12 kg/m   Trending CBC Recent Labs    06/22/23 2137 06/23/23 0410 06/24/23 0612  WBC 11.7* 11.3* 11.8*  HGB 7.1* 7.4* 7.5*  HCT 23.3* 23.8* 24.7*  PLT 354 327 341    Trending Coag's No results for input(s): "APTT", "INR" in the last 72 hours.  Trending BMET Recent Labs    06/22/23 2137 06/23/23 0410 06/24/23 0612  NA 142 143 145  K 4.3 3.9 4.1  CL 106 107 107  CO2 25 25 26   BUN 43* 44* 51*  CREATININE 1.06 1.25* 1.06  GLUCOSE 141* 151* 149*      Russell Garcia  Trauma Response RN  Please call TRN at [phone] for further assistance.
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
## 2023-06-25 NOTE — Progress Notes (Signed)
Patient's a-line is no longer working and no blood return, RT attempted to troubleshoot but was unsuccessful. A-line removed. Dr. Dossie Der aware, no new orders at this time.
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
## 2023-06-25 NOTE — Plan of Care (Signed)

## 2023-06-26 ENCOUNTER — Inpatient Hospital Stay (HOSPITAL_COMMUNITY): Payer: Medicare HMO

## 2023-06-26 DIAGNOSIS — J90 Pleural effusion, not elsewhere classified: Secondary | ICD-10-CM | POA: Diagnosis not present

## 2023-06-26 DIAGNOSIS — J9601 Acute respiratory failure with hypoxia: Secondary | ICD-10-CM

## 2023-06-26 DIAGNOSIS — Z4682 Encounter for fitting and adjustment of non-vascular catheter: Secondary | ICD-10-CM | POA: Diagnosis not present

## 2023-06-26 DIAGNOSIS — J984 Other disorders of lung: Secondary | ICD-10-CM | POA: Diagnosis not present

## 2023-06-26 DIAGNOSIS — J9811 Atelectasis: Secondary | ICD-10-CM | POA: Diagnosis not present

## 2023-06-26 LAB — CBC
HCT: 22 % — ABNORMAL LOW (ref 39.0–52.0)
Hemoglobin: 6.6 g/dL — CL (ref 13.0–17.0)
MCH: 26.7 pg (ref 26.0–34.0)
MCHC: 30 g/dL (ref 30.0–36.0)
MCV: 89.1 fL (ref 80.0–100.0)
Platelets: 289 10*3/uL (ref 150–400)
RBC: 2.47 MIL/uL — ABNORMAL LOW (ref 4.22–5.81)
RDW: 17.9 % — ABNORMAL HIGH (ref 11.5–15.5)
WBC: 8 10*3/uL (ref 4.0–10.5)
nRBC: 0.2 % (ref 0.0–0.2)

## 2023-06-26 LAB — BASIC METABOLIC PANEL
Anion gap: 9 (ref 5–15)
BUN: 47 mg/dL — ABNORMAL HIGH (ref 8–23)
CO2: 27 mmol/L (ref 22–32)
Calcium: 8.6 mg/dL — ABNORMAL LOW (ref 8.9–10.3)
Chloride: 108 mmol/L (ref 98–111)
Creatinine, Ser: 1.07 mg/dL (ref 0.61–1.24)
GFR, Estimated: >60 mL/min (ref >60)
Glucose, Bld: 152 mg/dL — ABNORMAL HIGH (ref 70–99)
Potassium: 3.4 mmol/L — ABNORMAL LOW (ref 3.5–5.1)
Sodium: 144 mmol/L (ref 135–145)

## 2023-06-26 LAB — PREPARE RBC (CROSSMATCH)

## 2023-06-26 LAB — GLUCOSE, CAPILLARY
Glucose-Capillary: 136 mg/dL — ABNORMAL HIGH (ref 70–99)
Glucose-Capillary: 141 mg/dL — ABNORMAL HIGH (ref 70–99)

## 2023-06-26 LAB — HEMOGLOBIN AND HEMATOCRIT, BLOOD
HCT: 27.5 % — ABNORMAL LOW (ref 39.0–52.0)
Hemoglobin: 8.1 g/dL — ABNORMAL LOW (ref 13.0–17.0)

## 2023-06-26 MED ORDER — ETOMIDATE 2 MG/ML IV SOLN: 20.0000 mg | Freq: Once | INTRAVENOUS | Status: AC

## 2023-06-26 MED ORDER — TRACE MINERALS CU-MN-SE-ZN 300-55-60-3000 MCG/ML IV SOLN
INTRAVENOUS | Status: AC
  Filled 2023-06-26: qty 1139.2

## 2023-06-26 MED ORDER — INSULIN ASPART 100 UNIT/ML IJ SOLN
0.0000 [IU] | Freq: Two times a day (BID) | INTRAMUSCULAR | Status: DC
  Administered 2023-06-26: 1 [IU] via SUBCUTANEOUS

## 2023-06-26 MED ORDER — BISACODYL 10 MG RE SUPP
10.0000 mg | Freq: Every day | RECTAL | Status: DC
  Administered 2023-06-26 – 2023-07-06 (×8): 10 mg via RECTAL
  Filled 2023-06-26 (×11): qty 1

## 2023-06-26 MED ORDER — SODIUM CHLORIDE 0.9% IV SOLUTION: Freq: Once | INTRAVENOUS | Status: DC

## 2023-06-26 MED ORDER — FENTANYL 2500MCG IN NS 250ML (10MCG/ML) PREMIX INFUSION
INTRAVENOUS | Status: AC
  Administered 2023-06-26: 50 ug/h via INTRAVENOUS
  Filled 2023-06-26: qty 250

## 2023-06-26 MED ORDER — FUROSEMIDE 10 MG/ML IJ SOLN
40.0000 mg | Freq: Once | INTRAMUSCULAR | Status: AC
  Administered 2023-06-26: 40 mg via INTRAVENOUS
  Filled 2023-06-26: qty 4

## 2023-06-26 MED ORDER — ROCURONIUM BROMIDE 10 MG/ML (PF) SYRINGE
PREFILLED_SYRINGE | INTRAVENOUS | Status: AC
  Administered 2023-06-26: 100 mg via INTRAVENOUS
  Filled 2023-06-26: qty 10

## 2023-06-26 MED ORDER — ROCURONIUM BROMIDE 50 MG/5ML IV SOLN
100.0000 mg | Freq: Once | INTRAVENOUS | Status: AC
  Filled 2023-06-26: qty 10

## 2023-06-26 MED ORDER — PROPOFOL 1000 MG/100ML IV EMUL
INTRAVENOUS | Status: AC
  Administered 2023-06-26: 30 ug/kg/min via INTRAVENOUS
  Filled 2023-06-26: qty 100

## 2023-06-26 MED ORDER — ETOMIDATE 2 MG/ML IV SOLN
INTRAVENOUS | Status: AC
  Administered 2023-06-26: 20 mg via INTRAVENOUS
  Filled 2023-06-26: qty 20

## 2023-06-26 MED ORDER — FENTANYL 2500MCG IN NS 250ML (10MCG/ML) PREMIX INFUSION
0.0000 ug/h | INTRAVENOUS | Status: DC
  Administered 2023-06-27: 100 ug/h via INTRAVENOUS
  Administered 2023-06-28 (×2): 150 ug/h via INTRAVENOUS
  Administered 2023-06-29: 50 ug/h via INTRAVENOUS
  Administered 2023-06-29: 150 ug/h via INTRAVENOUS
  Administered 2023-06-29: 100 ug/h via INTRAVENOUS
  Administered 2023-07-01: 75 ug/h via INTRAVENOUS
  Administered 2023-07-01: 100 ug/h via INTRAVENOUS
  Administered 2023-07-03: 25 ug/h via INTRAVENOUS
  Filled 2023-06-26 (×8): qty 250

## 2023-06-26 MED ORDER — KETAMINE HCL 50 MG/5ML IJ SOSY
PREFILLED_SYRINGE | INTRAMUSCULAR | Status: AC
  Filled 2023-06-26: qty 10

## 2023-06-26 MED ORDER — PROPOFOL 1000 MG/100ML IV EMUL
5.0000 ug/kg/min | INTRAVENOUS | Status: DC
  Administered 2023-06-27 (×2): 30 ug/kg/min via INTRAVENOUS
  Administered 2023-06-27: 40 ug/kg/min via INTRAVENOUS
  Administered 2023-06-27 (×2): 20 ug/kg/min via INTRAVENOUS
  Administered 2023-06-28 – 2023-06-29 (×8): 35 ug/kg/min via INTRAVENOUS
  Administered 2023-06-29: 30 ug/kg/min via INTRAVENOUS
  Administered 2023-06-29: 15 ug/kg/min via INTRAVENOUS
  Administered 2023-06-29: 35 ug/kg/min via INTRAVENOUS
  Administered 2023-06-29: 10 ug/kg/min via INTRAVENOUS
  Administered 2023-06-29 – 2023-06-30 (×7): 30 ug/kg/min via INTRAVENOUS
  Administered 2023-07-01 (×3): 20 ug/kg/min via INTRAVENOUS
  Administered 2023-07-01: 40.051 ug/kg/min via INTRAVENOUS
  Administered 2023-07-01 – 2023-07-02 (×2): 20 ug/kg/min via INTRAVENOUS
  Filled 2023-06-26 (×32): qty 100

## 2023-06-26 MED ORDER — MIDAZOLAM HCL 2 MG/2ML IJ SOLN
INTRAMUSCULAR | Status: AC
  Administered 2023-06-26: 2 mg via INTRAVENOUS
  Filled 2023-06-26: qty 2

## 2023-06-26 MED ORDER — SUCCINYLCHOLINE CHLORIDE 200 MG/10ML IV SOSY
PREFILLED_SYRINGE | INTRAVENOUS | Status: AC
  Filled 2023-06-26: qty 10

## 2023-06-26 MED ORDER — POTASSIUM CHLORIDE 10 MEQ/50ML IV SOLN
10.0000 meq | INTRAVENOUS | Status: AC
  Administered 2023-06-26 (×2): 10 meq via INTRAVENOUS
  Filled 2023-06-26 (×2): qty 50

## 2023-06-26 MED ORDER — FENTANYL CITRATE PF 50 MCG/ML IJ SOSY: 100.0000 ug | PREFILLED_SYRINGE | Freq: Once | INTRAMUSCULAR | Status: AC

## 2023-06-26 MED ORDER — MIDAZOLAM HCL 2 MG/2ML IJ SOLN: 2.0000 mg | Freq: Once | INTRAMUSCULAR | Status: AC

## 2023-06-26 MED ORDER — FENTANYL CITRATE PF 50 MCG/ML IJ SOSY
PREFILLED_SYRINGE | INTRAMUSCULAR | Status: AC
  Administered 2023-06-26: 100 ug via INTRAVENOUS
  Filled 2023-06-26: qty 2

## 2023-06-26 NOTE — Procedures (Signed)
Bronchoscopy Procedure Note  Russell Garcia  [id number]  1956/07/12  Date:06/26/23  Time:6:57 PM   Provider Performing:Kaliope Quinonez C Omara Alcon   Procedure(s):  Flexible Bronchoscopy [phone])  Indication(s) ETT malfunction  Consent Unable to obtain consent due to emergent nature of procedure.  Anesthesia Etomidate/roc/versed/fent   Time Out Verified patient identification, verified procedure, site/side was marked, verified correct patient position, special equipment/implants available, medications/allergies/relevant history reviewed, required imaging and test results available.   Sterile Technique Usual hand hygiene, masks, gowns, and gloves were used   Procedure Description Bronchoscope advanced through endotracheal tube and into airway.  Airways were examined down to subsegmental level with findings noted below.     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 Chest X-ray is not needed post procedure.   EBL Minimal   Specimen(s) N/A
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
## 2023-06-26 NOTE — Progress Notes (Signed)
RT arrived bedside after RN called due to vent alarming. RT noted patient peak pressures high, not receiving tidal volumes so RT attempted to suction but could not pass catheter. RT suctioned and lavaged and got multiple large plugs out. Sats dropped to 70's, HR dropped to 50's and patient difficult to bag. RN called MD and RN stated we can call CCM. RT called CCM and when they arrived, RT was finally able to bag patient and pass catheter. RT assisted MD with bedside bronchoscopy and retracted ETT 2cm from 26 @lips  to 24@lips  per MD. Patient currently stable.
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
## 2023-06-26 NOTE — Progress Notes (Signed)
Latest Reference Range & Units 06/26/23 05:45  Hemoglobin 13.0 - 17.0 g/dL 6.6 (LL)  (LL): Data is critically low  Trauma notified.
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
## 2023-06-26 NOTE — Progress Notes (Signed)
Patient became increasingly tachypneic, tachycardic, and hyptertensive. RT was called to bedside to assess respiratory status. After unable to suction ETT and patient becoming bradycardic, Trauma was notified for possible need for ETT exchange. Dr. Katrinka Blazing was called to bedside and etomidate, rocuronium, versed, and fentanyl were ordered. Patient began tolerating ventilator and tidal volumes improved. Propofol/fentanyl gtts were started per Dr. Katrinka Blazing and patient's vitals began improving. Will continue to care for patient per provider's orders

## 2023-06-27 ENCOUNTER — Inpatient Hospital Stay (HOSPITAL_COMMUNITY): Payer: Medicare HMO

## 2023-06-27 DIAGNOSIS — Z452 Encounter for adjustment and management of vascular access device: Secondary | ICD-10-CM | POA: Diagnosis not present

## 2023-06-27 DIAGNOSIS — Z4682 Encounter for fitting and adjustment of non-vascular catheter: Secondary | ICD-10-CM | POA: Diagnosis not present

## 2023-06-27 DIAGNOSIS — R918 Other nonspecific abnormal finding of lung field: Secondary | ICD-10-CM | POA: Diagnosis not present

## 2023-06-27 LAB — COMPREHENSIVE METABOLIC PANEL
ALT: 29 U/L (ref 0–44)
AST: 23 U/L (ref 15–41)
Albumin: 1.9 g/dL — ABNORMAL LOW (ref 3.5–5.0)
Alkaline Phosphatase: 55 U/L (ref 38–126)
Anion gap: 10 (ref 5–15)
BUN: 41 mg/dL — ABNORMAL HIGH (ref 8–23)
CO2: 26 mmol/L (ref 22–32)
Calcium: 8.4 mg/dL — ABNORMAL LOW (ref 8.9–10.3)
Chloride: 104 mmol/L (ref 98–111)
Creatinine, Ser: 1.03 mg/dL (ref 0.61–1.24)
GFR, Estimated: >60 mL/min (ref >60)
Glucose, Bld: 119 mg/dL — ABNORMAL HIGH (ref 70–99)
Potassium: 3.6 mmol/L (ref 3.5–5.1)
Sodium: 140 mmol/L (ref 135–145)
Total Bilirubin: 0.6 mg/dL (ref 0.3–1.2)
Total Protein: 5.9 g/dL — ABNORMAL LOW (ref 6.5–8.1)

## 2023-06-27 LAB — TRIGLYCERIDES: Triglycerides: 158 mg/dL — ABNORMAL HIGH (ref <150)

## 2023-06-27 LAB — PHOSPHORUS: Phosphorus: 3 mg/dL (ref 2.5–4.6)

## 2023-06-27 LAB — MAGNESIUM: Magnesium: 2.2 mg/dL (ref 1.7–2.4)

## 2023-06-27 LAB — CBC
HCT: 27.4 % — ABNORMAL LOW (ref 39.0–52.0)
Hemoglobin: 8.4 g/dL — ABNORMAL LOW (ref 13.0–17.0)
MCH: 27.5 pg (ref 26.0–34.0)
MCHC: 30.7 g/dL (ref 30.0–36.0)
MCV: 89.5 fL (ref 80.0–100.0)
Platelets: 309 10*3/uL (ref 150–400)
RBC: 3.06 MIL/uL — ABNORMAL LOW (ref 4.22–5.81)
RDW: 17.7 % — ABNORMAL HIGH (ref 11.5–15.5)
WBC: 9.3 10*3/uL (ref 4.0–10.5)
nRBC: 0 % (ref 0.0–0.2)

## 2023-06-27 LAB — TYPE AND SCREEN
ABO/RH(D): A POS
Antibody Screen: NEGATIVE
Unit division: 0

## 2023-06-27 LAB — VALPROIC ACID LEVEL: Valproic Acid Lvl: 14 ug/mL — ABNORMAL LOW (ref 50.0–100.0)

## 2023-06-27 LAB — BPAM RBC
Blood Product Expiration Date: 202409222359
ISSUE DATE / TIME: 202409010921
Unit Type and Rh: 6200

## 2023-06-27 LAB — GLUCOSE, CAPILLARY
Glucose-Capillary: 118 mg/dL — ABNORMAL HIGH (ref 70–99)
Glucose-Capillary: 104 mg/dL — ABNORMAL HIGH (ref 70–99)

## 2023-06-27 MED ORDER — GUAIFENESIN 100 MG/5ML PO LIQD
15.0000 mL | ORAL | Status: DC
  Administered 2023-06-27 – 2023-07-15 (×90): 15 mL
  Filled 2023-06-27 (×5): qty 15
  Filled 2023-06-27: qty 20
  Filled 2023-06-27 (×9): qty 15
  Filled 2023-06-27: qty 20
  Filled 2023-06-27: qty 15
  Filled 2023-06-27: qty 20
  Filled 2023-06-27 (×8): qty 15
  Filled 2023-06-27: qty 20
  Filled 2023-06-27 (×9): qty 15
  Filled 2023-06-27: qty 20
  Filled 2023-06-27 (×3): qty 15
  Filled 2023-06-27: qty 20
  Filled 2023-06-27 (×3): qty 15
  Filled 2023-06-27: qty 20
  Filled 2023-06-27 (×2): qty 15
  Filled 2023-06-27: qty 20
  Filled 2023-06-27 (×12): qty 15
  Filled 2023-06-27: qty 20
  Filled 2023-06-27 (×10): qty 15
  Filled 2023-06-27: qty 20
  Filled 2023-06-27 (×17): qty 15

## 2023-06-27 MED ORDER — TRACE MINERALS CU-MN-SE-ZN 300-55-60-3000 MCG/ML IV SOLN
INTRAVENOUS | Status: AC
  Filled 2023-06-27: qty 1141.27

## 2023-06-27 NOTE — Plan of Care (Signed)

## 2023-06-27 NOTE — Progress Notes (Signed)
Patient et tube had audible leak and patient was not get exhales volumes back. RT advanced et tube to 26cm @ lip. notified bedside RN. She stated she will put a cxr In for placement.
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
## 2023-06-28 ENCOUNTER — Inpatient Hospital Stay (HOSPITAL_COMMUNITY): Payer: Medicare HMO

## 2023-06-28 DIAGNOSIS — Z4682 Encounter for fitting and adjustment of non-vascular catheter: Secondary | ICD-10-CM | POA: Diagnosis not present

## 2023-06-28 DIAGNOSIS — K567 Ileus, unspecified: Secondary | ICD-10-CM | POA: Diagnosis not present

## 2023-06-28 LAB — BASIC METABOLIC PANEL
Anion gap: 8 (ref 5–15)
BUN: 35 mg/dL — ABNORMAL HIGH (ref 8–23)
CO2: 28 mmol/L (ref 22–32)
Calcium: 8.4 mg/dL — ABNORMAL LOW (ref 8.9–10.3)
Chloride: 108 mmol/L (ref 98–111)
Creatinine, Ser: 0.94 mg/dL (ref 0.61–1.24)
GFR, Estimated: >60 mL/min (ref >60)
Glucose, Bld: 118 mg/dL — ABNORMAL HIGH (ref 70–99)
Potassium: 3.5 mmol/L (ref 3.5–5.1)
Sodium: 144 mmol/L (ref 135–145)

## 2023-06-28 LAB — CBC
HCT: 26.4 % — ABNORMAL LOW (ref 39.0–52.0)
Hemoglobin: 8.3 g/dL — ABNORMAL LOW (ref 13.0–17.0)
MCH: 30.1 pg (ref 26.0–34.0)
MCHC: 31.4 g/dL (ref 30.0–36.0)
MCV: 95.7 fL (ref 80.0–100.0)
Platelets: 297 10*3/uL (ref 150–400)
RBC: 2.76 MIL/uL — ABNORMAL LOW (ref 4.22–5.81)
RDW: 19.8 % — ABNORMAL HIGH (ref 11.5–15.5)
WBC: 7.9 10*3/uL (ref 4.0–10.5)
nRBC: 0 % (ref 0.0–0.2)

## 2023-06-28 LAB — PHOSPHORUS: Phosphorus: 3.2 mg/dL (ref 2.5–4.6)

## 2023-06-28 LAB — MAGNESIUM: Magnesium: 2.1 mg/dL (ref 1.7–2.4)

## 2023-06-28 LAB — GLUCOSE, CAPILLARY
Glucose-Capillary: 88 mg/dL (ref 70–99)
Glucose-Capillary: 103 mg/dL — ABNORMAL HIGH (ref 70–99)

## 2023-06-28 MED ORDER — VALPROATE SODIUM 100 MG/ML IV SOLN
500.0000 mg | Freq: Three times a day (TID) | INTRAVENOUS | Status: DC
  Administered 2023-06-28 – 2023-07-06 (×24): 500 mg via INTRAVENOUS
  Filled 2023-06-28 (×3): qty 500
  Filled 2023-06-28 (×8): qty 5
  Filled 2023-06-28: qty 500
  Filled 2023-06-28 (×11): qty 5
  Filled 2023-06-28: qty 500
  Filled 2023-06-28 (×6): qty 5

## 2023-06-28 MED ORDER — PANTOPRAZOLE SODIUM 40 MG IV SOLR
40.0000 mg | INTRAVENOUS | Status: DC
  Administered 2023-06-28 – 2023-07-12 (×15): 40 mg via INTRAVENOUS
  Filled 2023-06-28 (×15): qty 10

## 2023-06-28 MED ORDER — TRACE MINERALS CU-MN-SE-ZN 300-55-60-3000 MCG/ML IV SOLN
INTRAVENOUS | Status: AC
  Filled 2023-06-28: qty 1066.73

## 2023-06-28 NOTE — Progress Notes (Signed)
Patient ID: Russell Garcia, male   DOB: [birth year], 67 y.o.   MRN: [id number] I called his brother/decision maker, Jonas Worrell,  for a clinical update and to discuss possible trach. Left VM.  Violeta Gelinas, MD, MPH, FACS Please use [email or web address] to contact on call provider
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
## 2023-06-28 NOTE — Telephone Encounter (Signed)
Pt still in hospital

## 2023-06-28 NOTE — Plan of Care (Signed)
  Problem: Clinical Measurements: Goal: Will remain free from infection Outcome: Progressing   Problem: Nutrition: Goal: Adequate nutrition will be maintained Outcome: Progressing   
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
## 2023-06-29 ENCOUNTER — Inpatient Hospital Stay (HOSPITAL_COMMUNITY): Payer: Medicare HMO | Admitting: Certified Registered"

## 2023-06-29 ENCOUNTER — Inpatient Hospital Stay (HOSPITAL_COMMUNITY): Payer: Medicare HMO

## 2023-06-29 DIAGNOSIS — J90 Pleural effusion, not elsewhere classified: Secondary | ICD-10-CM | POA: Diagnosis not present

## 2023-06-29 DIAGNOSIS — J811 Chronic pulmonary edema: Secondary | ICD-10-CM | POA: Diagnosis not present

## 2023-06-29 DIAGNOSIS — R918 Other nonspecific abnormal finding of lung field: Secondary | ICD-10-CM | POA: Diagnosis not present

## 2023-06-29 LAB — BASIC METABOLIC PANEL
Anion gap: 8 (ref 5–15)
BUN: 33 mg/dL — ABNORMAL HIGH (ref 8–23)
CO2: 28 mmol/L (ref 22–32)
Calcium: 8.2 mg/dL — ABNORMAL LOW (ref 8.9–10.3)
Chloride: 108 mmol/L (ref 98–111)
Creatinine, Ser: 0.88 mg/dL (ref 0.61–1.24)
GFR, Estimated: >60 mL/min (ref >60)
Glucose, Bld: 124 mg/dL — ABNORMAL HIGH (ref 70–99)
Potassium: 4.1 mmol/L (ref 3.5–5.1)
Sodium: 144 mmol/L (ref 135–145)

## 2023-06-29 LAB — CBC
HCT: 28.1 % — ABNORMAL LOW (ref 39.0–52.0)
Hemoglobin: 8.6 g/dL — ABNORMAL LOW (ref 13.0–17.0)
MCH: 27.3 pg (ref 26.0–34.0)
MCHC: 30.6 g/dL (ref 30.0–36.0)
MCV: 89.2 fL (ref 80.0–100.0)
Platelets: 294 10*3/uL (ref 150–400)
RBC: 3.15 MIL/uL — ABNORMAL LOW (ref 4.22–5.81)
RDW: 17.6 % — ABNORMAL HIGH (ref 11.5–15.5)
WBC: 8.1 10*3/uL (ref 4.0–10.5)
nRBC: 0 % (ref 0.0–0.2)

## 2023-06-29 LAB — URINALYSIS, ROUTINE W REFLEX MICROSCOPIC
Bilirubin Urine: NEGATIVE
Glucose, UA: NEGATIVE mg/dL
Hgb urine dipstick: NEGATIVE
Ketones, ur: NEGATIVE mg/dL
Leukocytes,Ua: NEGATIVE
Nitrite: NEGATIVE
Protein, ur: 30 mg/dL — AB
Specific Gravity, Urine: 1.017 (ref 1.005–1.030)
pH: 5 (ref 5.0–8.0)

## 2023-06-29 LAB — GLUCOSE, CAPILLARY
Glucose-Capillary: 117 mg/dL — ABNORMAL HIGH (ref 70–99)
Glucose-Capillary: 126 mg/dL — ABNORMAL HIGH (ref 70–99)
Glucose-Capillary: 112 mg/dL — ABNORMAL HIGH (ref 70–99)
Glucose-Capillary: 109 mg/dL — ABNORMAL HIGH (ref 70–99)

## 2023-06-29 MED ORDER — ROCURONIUM BROMIDE 50 MG/5ML IV SOLN
100.0000 mg | Freq: Once | INTRAVENOUS | Status: AC
  Administered 2023-06-29: 100 mg via INTRAVENOUS
  Filled 2023-06-29: qty 10

## 2023-06-29 MED ORDER — RACEPINEPHRINE HCL 2.25 % IN NEBU
0.5000 mL | INHALATION_SOLUTION | RESPIRATORY_TRACT | Status: AC
  Administered 2023-06-29 (×2): 0.5 mL via RESPIRATORY_TRACT
  Filled 2023-06-29 (×2): qty 0.5

## 2023-06-29 MED ORDER — TRACE MINERALS CU-MN-SE-ZN 300-55-60-3000 MCG/ML IV SOLN
INTRAVENOUS | Status: AC
  Filled 2023-06-29: qty 1139.2

## 2023-06-29 MED ORDER — ETOMIDATE 2 MG/ML IV SOLN: 20.0000 mg | Freq: Once | INTRAVENOUS | Status: AC

## 2023-06-29 MED ORDER — ETOMIDATE 2 MG/ML IV SOLN
INTRAVENOUS | Status: AC
  Administered 2023-06-29: 20 mg
  Filled 2023-06-29: qty 10

## 2023-06-29 MED ORDER — VITAL HIGH PROTEIN PO LIQD: 1000.0000 mL | ORAL | Status: DC

## 2023-06-29 MED ORDER — ROCURONIUM BROMIDE 10 MG/ML (PF) SYRINGE
PREFILLED_SYRINGE | INTRAVENOUS | Status: AC
  Filled 2023-06-29: qty 10

## 2023-06-29 NOTE — Procedures (Signed)
Intubation Procedure Note  Russell Garcia  [id number]  January 05, 1956  Date:06/29/23  Time:1:34 PM   Provider Performing: Diamantina Monks    Procedure: Intubation (31500)  Indication(s) Respiratory Failure  Consent Risks of the procedure as well as the alternatives and risks of each were explained to the patient and/or caregiver.  Consent for the procedure was obtained and is signed in the bedside chart   Anesthesia Etomidate and Rocuronium   Time Out Verified patient identification, verified procedure, site/side was marked, verified correct patient position, special equipment/implants available, medications/allergies/relevant history reviewed, required imaging and test results available.   Sterile Technique Usual hand hygeine, masks, and gloves were used   Procedure Description Patient positioned in bed supine.  Sedation given as noted above.  Patient was intubated with endotracheal tube using Glidescope.  View was Grade 1 full glottis .  Number of attempts was  2 .  Colorimetric CO2 detector was consistent with tracheal placement.   Complications/Tolerance None; patient tolerated the procedure well. Chest X-ray is ordered to verify placement.   EBL none   Specimen(s) None

## 2023-06-29 NOTE — Progress Notes (Signed)
PT Cancellation Note  Patient Details Name: Russell Garcia MRN: [id number] DOB: [birth year]   Cancelled Treatment:    Reason Eval/Treat Not Completed: Medical issues which prohibited therapy;Patient not medically ready (Pt re-intuabated and sedated. plan for possible trach tomorrow at ~3pm. Will follow up at later date/time as pt able and schedule allows.)   Renaldo Fiddler PT, DPT Acute Rehabilitation Services Office [phone]  06/29/23 3:51 PM
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
## 2023-06-29 NOTE — Progress Notes (Signed)
Inpatient Rehab Admissions Coordinator:   Per therapy recommendations, patient was screened for CIR candidacy by Megan Salon, MS, CCC-SLP. At this time, Pt. is not yet tolerating OOB and not at a level to tolerate the intensity of CIR; however,  Pt. may have potential to progress to becoming a potential CIR candidate, so CIR admissions team will follow and monitor for progress and participation with therapies and place consult order if Pt. appears to be an appropriate candidate. Please contact me with any questions.   Megan Salon, MS, CCC-SLP Rehab Admissions Coordinator  [phone] (celll) [phone] (office)

## 2023-06-29 NOTE — Progress Notes (Signed)
RN reached out to vascular PA about Pt's R upper arm staples. Per PA leave staples at this time due to swelling.

## 2023-06-29 NOTE — Procedures (Signed)
Extubation Procedure Note  Patient Details:   Name: Russell Garcia DOB: 09/13/1956 MRN: [id number]   Airway Documentation:    Vent end date: 06/29/23 Vent end time: 1125   Evaluation  O2 sats: stable throughout Complications: No apparent complications Patient did tolerate procedure well. Bilateral Breath Sounds: Diminished   Yes  Patient extubated to 4L [place] per MD order.  Positive cuff leak noted.  No evidence of stridor.  Patient able to speak post extubation.  Sats and vitals stable at this time.  Will continue to monitor.   Elyn Peers 06/29/2023, 11:29 AM
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
## 2023-06-29 NOTE — Progress Notes (Signed)
Extubated after 3h of PSV, passed all weaning parameters. Early failure with accessory muscle use, labored breathing, and stridor refractory to racemic epic. Reintubated. On the schedule for trach 9/5 at 1500 with Dr. Janee Morn. Informed consent obtained form the patient's brother, patient-designated Management consultant.   Additional critical care time:  Diamantina Monks, MD General and Trauma Surgery [facility] Surgery

## 2023-06-29 NOTE — Progress Notes (Signed)
ETT pulled back from 26 at the lips to 24 at the lips per MD order.  Will continue to monitor.
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
## 2023-06-29 NOTE — Progress Notes (Signed)
Patient reintubated by MD due to patient developing stridor that did not improve with two racemic epi treatments given.  Difficult airway due to swelling however intubated without complications.  Will continue to monitor.

## 2023-06-29 NOTE — Progress Notes (Signed)
Palpable right radial pulse  Plan for staple removal next week.    Victorino Sparrow MD

## 2023-06-30 ENCOUNTER — Inpatient Hospital Stay (HOSPITAL_COMMUNITY): Payer: Medicare HMO

## 2023-06-30 ENCOUNTER — Encounter (HOSPITAL_COMMUNITY): Admission: EM | Disposition: A | Discharge status: Rehabilitation Facility | Payer: Self-pay | Source: Home / Self Care

## 2023-06-30 DIAGNOSIS — J9690 Respiratory failure, unspecified, unspecified whether with hypoxia or hypercapnia: Secondary | ICD-10-CM | POA: Diagnosis not present

## 2023-06-30 DIAGNOSIS — R918 Other nonspecific abnormal finding of lung field: Secondary | ICD-10-CM | POA: Diagnosis not present

## 2023-06-30 DIAGNOSIS — Z452 Encounter for adjustment and management of vascular access device: Secondary | ICD-10-CM | POA: Diagnosis not present

## 2023-06-30 DIAGNOSIS — Z4682 Encounter for fitting and adjustment of non-vascular catheter: Secondary | ICD-10-CM | POA: Diagnosis not present

## 2023-06-30 LAB — CBC
HCT: 27.9 % — ABNORMAL LOW (ref 39.0–52.0)
Hemoglobin: 8 g/dL — ABNORMAL LOW (ref 13.0–17.0)
MCH: 26.1 pg (ref 26.0–34.0)
MCHC: 28.7 g/dL — ABNORMAL LOW (ref 30.0–36.0)
MCV: 91.2 fL (ref 80.0–100.0)
Platelets: 268 10*3/uL (ref 150–400)
RBC: 3.06 MIL/uL — ABNORMAL LOW (ref 4.22–5.81)
RDW: 17.9 % — ABNORMAL HIGH (ref 11.5–15.5)
WBC: 7 10*3/uL (ref 4.0–10.5)
nRBC: 0 % (ref 0.0–0.2)

## 2023-06-30 LAB — COMPREHENSIVE METABOLIC PANEL
ALT: 28 U/L (ref 0–44)
AST: 21 U/L (ref 15–41)
Albumin: 2 g/dL — ABNORMAL LOW (ref 3.5–5.0)
Alkaline Phosphatase: 52 U/L (ref 38–126)
Anion gap: 10 (ref 5–15)
BUN: 41 mg/dL — ABNORMAL HIGH (ref 8–23)
CO2: 29 mmol/L (ref 22–32)
Calcium: 8.2 mg/dL — ABNORMAL LOW (ref 8.9–10.3)
Chloride: 103 mmol/L (ref 98–111)
Creatinine, Ser: 0.94 mg/dL (ref 0.61–1.24)
GFR, Estimated: >60 mL/min (ref >60)
Glucose, Bld: 128 mg/dL — ABNORMAL HIGH (ref 70–99)
Potassium: 4 mmol/L (ref 3.5–5.1)
Sodium: 142 mmol/L (ref 135–145)
Total Bilirubin: 0.2 mg/dL — ABNORMAL LOW (ref 0.3–1.2)
Total Protein: 6 g/dL — ABNORMAL LOW (ref 6.5–8.1)

## 2023-06-30 LAB — GLUCOSE, CAPILLARY
Glucose-Capillary: 102 mg/dL — ABNORMAL HIGH (ref 70–99)
Glucose-Capillary: 120 mg/dL — ABNORMAL HIGH (ref 70–99)
Glucose-Capillary: 127 mg/dL — ABNORMAL HIGH (ref 70–99)
Glucose-Capillary: 118 mg/dL — ABNORMAL HIGH (ref 70–99)
Glucose-Capillary: 120 mg/dL — ABNORMAL HIGH (ref 70–99)

## 2023-06-30 LAB — PHOSPHORUS: Phosphorus: 3 mg/dL (ref 2.5–4.6)

## 2023-06-30 LAB — MAGNESIUM: Magnesium: 2.1 mg/dL (ref 1.7–2.4)

## 2023-06-30 SURGERY — CREATION, TRACHEOSTOMY
Anesthesia: General

## 2023-06-30 MED ORDER — TRACE MINERALS CU-MN-SE-ZN 300-55-60-3000 MCG/ML IV SOLN
INTRAVENOUS | Status: AC
  Filled 2023-06-30: qty 1123.2

## 2023-06-30 MED ORDER — CEFAZOLIN SODIUM-DEXTROSE 2-4 GM/100ML-% IV SOLN: 2.0000 g | Freq: Once | INTRAVENOUS | Status: DC
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
## 2023-06-30 NOTE — Progress Notes (Signed)
CCC Pre-op Review  Pre-op checklist:  asked RN to complete at time of note  NPO:  TF's off since MN  Labs:  h/h 8/27.9  plts 268   alb 2.0  Consent:  signed  H&P:  done  Vitals:  98.2  132/68 p84 r 20 VENTED  O2 requirements:   VENTED on FiO2 60%  MAR/PTA review:  yes  IV:   TL PICC  Floor nurse name:   Deland Pretty RN  [phone]  Additional info:
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
## 2023-07-01 ENCOUNTER — Inpatient Hospital Stay (HOSPITAL_COMMUNITY): Payer: Medicare HMO | Admitting: Anesthesiology

## 2023-07-01 ENCOUNTER — Encounter (HOSPITAL_COMMUNITY): Admission: EM | Disposition: A | Discharge status: Rehabilitation Facility | Payer: Self-pay | Source: Home / Self Care

## 2023-07-01 ENCOUNTER — Inpatient Hospital Stay (HOSPITAL_COMMUNITY): Payer: Medicare HMO

## 2023-07-01 DIAGNOSIS — J9690 Respiratory failure, unspecified, unspecified whether with hypoxia or hypercapnia: Secondary | ICD-10-CM | POA: Diagnosis not present

## 2023-07-01 DIAGNOSIS — Z4682 Encounter for fitting and adjustment of non-vascular catheter: Secondary | ICD-10-CM | POA: Diagnosis not present

## 2023-07-01 DIAGNOSIS — R0989 Other specified symptoms and signs involving the circulatory and respiratory systems: Secondary | ICD-10-CM | POA: Diagnosis not present

## 2023-07-01 DIAGNOSIS — J90 Pleural effusion, not elsewhere classified: Secondary | ICD-10-CM | POA: Diagnosis not present

## 2023-07-01 DIAGNOSIS — R918 Other nonspecific abnormal finding of lung field: Secondary | ICD-10-CM | POA: Diagnosis not present

## 2023-07-01 HISTORY — PX: TRACHEOSTOMY TUBE PLACEMENT: SHX814

## 2023-07-01 LAB — GLUCOSE, CAPILLARY
Glucose-Capillary: 122 mg/dL — ABNORMAL HIGH (ref 70–99)
Glucose-Capillary: 113 mg/dL — ABNORMAL HIGH (ref 70–99)
Glucose-Capillary: 129 mg/dL — ABNORMAL HIGH (ref 70–99)
Glucose-Capillary: 107 mg/dL — ABNORMAL HIGH (ref 70–99)
Glucose-Capillary: 100 mg/dL — ABNORMAL HIGH (ref 70–99)
Glucose-Capillary: 124 mg/dL — ABNORMAL HIGH (ref 70–99)

## 2023-07-01 LAB — BASIC METABOLIC PANEL
Anion gap: 10 (ref 5–15)
BUN: 32 mg/dL — ABNORMAL HIGH (ref 8–23)
CO2: 28 mmol/L (ref 22–32)
Calcium: 8.6 mg/dL — ABNORMAL LOW (ref 8.9–10.3)
Chloride: 102 mmol/L (ref 98–111)
Creatinine, Ser: 0.95 mg/dL (ref 0.61–1.24)
GFR, Estimated: >60 mL/min (ref >60)
Glucose, Bld: 124 mg/dL — ABNORMAL HIGH (ref 70–99)
Potassium: 4.3 mmol/L (ref 3.5–5.1)
Sodium: 140 mmol/L (ref 135–145)

## 2023-07-01 LAB — CBC
HCT: 27.9 % — ABNORMAL LOW (ref 39.0–52.0)
Hemoglobin: 8.4 g/dL — ABNORMAL LOW (ref 13.0–17.0)
MCH: 27.1 pg (ref 26.0–34.0)
MCHC: 30.1 g/dL (ref 30.0–36.0)
MCV: 90 fL (ref 80.0–100.0)
Platelets: 263 10*3/uL (ref 150–400)
RBC: 3.1 MIL/uL — ABNORMAL LOW (ref 4.22–5.81)
RDW: 17.7 % — ABNORMAL HIGH (ref 11.5–15.5)
WBC: 7.1 10*3/uL (ref 4.0–10.5)
nRBC: 0 % (ref 0.0–0.2)

## 2023-07-01 SURGERY — CREATION, TRACHEOSTOMY
Anesthesia: General

## 2023-07-01 MED ORDER — ROCURONIUM BROMIDE 10 MG/ML (PF) SYRINGE
PREFILLED_SYRINGE | INTRAVENOUS | Status: DC | PRN
  Administered 2023-07-01: 100 mg via INTRAVENOUS

## 2023-07-01 MED ORDER — FENTANYL CITRATE (PF) 250 MCG/5ML IJ SOLN
INTRAMUSCULAR | Status: AC
  Filled 2023-07-01: qty 5

## 2023-07-01 MED ORDER — PHENYLEPHRINE 80 MCG/ML (10ML) SYRINGE FOR IV PUSH (FOR BLOOD PRESSURE SUPPORT)
PREFILLED_SYRINGE | INTRAVENOUS | Status: DC | PRN
  Administered 2023-07-01 (×3): 160 ug via INTRAVENOUS

## 2023-07-01 MED ORDER — ENOXAPARIN SODIUM 120 MG/0.8ML IJ SOSY
120.0000 mg | PREFILLED_SYRINGE | Freq: Two times a day (BID) | INTRAMUSCULAR | Status: DC
  Administered 2023-07-02 – 2023-07-08 (×13): 120 mg via SUBCUTANEOUS
  Filled 2023-07-01 (×14): qty 0.8

## 2023-07-01 MED ORDER — FENTANYL CITRATE (PF) 250 MCG/5ML IJ SOLN
INTRAMUSCULAR | Status: DC | PRN
  Administered 2023-07-01: 50 ug via INTRAVENOUS

## 2023-07-01 MED ORDER — MIDAZOLAM HCL 2 MG/2ML IJ SOLN
INTRAMUSCULAR | Status: DC | PRN
  Administered 2023-07-01: 2 mg via INTRAVENOUS

## 2023-07-01 MED ORDER — TRACE MINERALS CU-MN-SE-ZN 300-55-60-3000 MCG/ML IV SOLN
INTRAVENOUS | Status: AC
  Filled 2023-07-01: qty 1139.2

## 2023-07-01 MED ORDER — PIVOT 1.5 CAL PO LIQD
1000.0000 mL | ORAL | Status: DC
  Administered 2023-07-02: 1000 mL

## 2023-07-01 MED ORDER — ENOXAPARIN SODIUM 120 MG/0.8ML IJ SOSY: 120.0000 mg | PREFILLED_SYRINGE | Freq: Two times a day (BID) | INTRAMUSCULAR | Status: DC

## 2023-07-01 MED ORDER — 0.9 % SODIUM CHLORIDE (POUR BTL) OPTIME
TOPICAL | Status: DC | PRN
  Administered 2023-07-01: 1000 mL

## 2023-07-01 MED ORDER — PROPOFOL 10 MG/ML IV BOLUS
INTRAVENOUS | Status: DC | PRN
  Administered 2023-07-01: 70 mg via INTRAVENOUS

## 2023-07-01 MED ORDER — MIDAZOLAM HCL 2 MG/2ML IJ SOLN
INTRAMUSCULAR | Status: AC
  Filled 2023-07-01: qty 2

## 2023-07-01 MED ORDER — LACTATED RINGERS IV SOLN
INTRAVENOUS | Status: DC | PRN
Start: 2023-07-01 — End: 2023-07-01

## 2023-07-01 MED ORDER — LIDOCAINE-EPINEPHRINE 1.5 %-1:200,000 OPTIME - NO CHARGE
INTRAMUSCULAR | Status: DC | PRN
Start: 2023-07-01 — End: 2023-07-01
  Administered 2023-07-01: 5 mL via SUBCUTANEOUS

## 2023-07-01 MED ORDER — CEFAZOLIN SODIUM-DEXTROSE 2-3 GM-%(50ML) IV SOLR
INTRAVENOUS | Status: DC | PRN
Start: 2023-07-01 — End: 2023-07-01
  Administered 2023-07-01: 3 g via INTRAVENOUS

## 2023-07-01 SURGICAL SUPPLY — 36 items
BAG COUNTER SPONGE SURGICOUNT (BAG) ×2 IMPLANT
BAG SPNG CNTER NS LX DISP (BAG) ×1
BLADE CLIPPER SURG (BLADE) IMPLANT
CANISTER SUCT 3000ML PPV (MISCELLANEOUS) ×2 IMPLANT
CANISTER WOUNDNEG PRESSURE 500 (CANNISTER) IMPLANT
COVER SURGICAL LIGHT HANDLE (MISCELLANEOUS) ×2 IMPLANT
DRAPE UTILITY XL STRL (DRAPES) ×2 IMPLANT
ELECT CAUTERY BLADE 6.4 (BLADE) ×2 IMPLANT
ELECT REM PT RETURN 9FT ADLT (ELECTROSURGICAL) ×1
ELECTRODE REM PT RTRN 9FT ADLT (ELECTROSURGICAL) ×2 IMPLANT
GAUZE 4X4 16PLY ~~LOC~~+RFID DBL (SPONGE) ×2 IMPLANT
GLOVE BIO SURGEON STRL SZ8 (GLOVE) ×2 IMPLANT
GLOVE BIOGEL PI IND STRL 8 (GLOVE) ×2 IMPLANT
GOWN STRL REUS W/ TWL LRG LVL3 (GOWN DISPOSABLE) ×2 IMPLANT
GOWN STRL REUS W/ TWL XL LVL3 (GOWN DISPOSABLE) ×2 IMPLANT
GOWN STRL REUS W/TWL LRG LVL3 (GOWN DISPOSABLE) ×1
GOWN STRL REUS W/TWL XL LVL3 (GOWN DISPOSABLE) ×1
KIT BASIN OR (CUSTOM PROCEDURE TRAY) ×2 IMPLANT
KIT TURNOVER KIT B (KITS) ×2 IMPLANT
NS IRRIG 1000ML POUR BTL (IV SOLUTION) ×2 IMPLANT
PACK EENT II TURBAN DRAPE (CUSTOM PROCEDURE TRAY) ×2 IMPLANT
PAD ARMBOARD 7.5X6 YLW CONV (MISCELLANEOUS) ×4 IMPLANT
PENCIL BUTTON HOLSTER BLD 10FT (ELECTRODE) ×2 IMPLANT
SPONGE DRAIN TRACH 4X4 STRL 2S (GAUZE/BANDAGES/DRESSINGS) IMPLANT
SPONGE INTESTINAL PEANUT (DISPOSABLE) ×2 IMPLANT
SUT VICRYL AB 3 0 TIES (SUTURE) ×2 IMPLANT
SYR 20ML LL LF (SYRINGE) ×2 IMPLANT
SYR BULB EAR ULCER 3OZ GRN STR (SYRINGE) IMPLANT
TOWEL GREEN STERILE (TOWEL DISPOSABLE) ×2 IMPLANT
TOWEL GREEN STERILE FF (TOWEL DISPOSABLE) ×2 IMPLANT
TRAY TRACH DILATOR BL RHINO G2 (MISCELLANEOUS) IMPLANT
TRAY W/TRACH TUBE 7.5 (MISCELLANEOUS) IMPLANT
TRAY W/TRACH TUBE 8.5 (MISCELLANEOUS) IMPLANT
TUBE CONNECTING 12X1/4 (SUCTIONS) ×2 IMPLANT
TUBE TRACH 6 EXL DIST CUF (TUBING) IMPLANT
TUBE TRACH 6 EXL PROX UNCUF (TUBING) IMPLANT

## 2023-07-01 NOTE — Progress Notes (Signed)
Spoke with Dr. Karin Lieu with vasc. Ok for staples in RUE to come out. Removed 17 skin staples from extremity

## 2023-07-01 NOTE — Op Note (Signed)
  07/01/2023  12:07 PM  PATIENT:  Russell Garcia  67 y.o. male  PRE-OPERATIVE DIAGNOSIS:  prolonged mechanical ventilation  POST-OPERATIVE DIAGNOSIS:  prolonged mechanical ventilation  PROCEDURE:  Procedure(s): TRACHEOSTOMY #6 LONG PROXIMAL XLT  SURGEON:  Surgeon(s): Violeta Gelinas, MD  ASSISTANTS: Leary Roca, PA-C   ANESTHESIA:   local and general  EBL:  Total I/O In: 326.3 [I.V.:276.3; NG/GT:50] Out: 500 [Urine:500]  BLOOD ADMINISTERED:none  DRAINS: none   SPECIMEN:  No Specimen  DISPOSITION OF SPECIMEN:  N/A  COUNTS:  YES  DICTATION: .Dragon Dictation Procedure detail: Informed consent was obtained.  He was brought directly from the ICU to the operating room on the ventilator.  He received intravenous antibiotics.  General anesthesia was administered by the anesthesia staff.  His neck was positioned appropriately.  It was prepped and draped in a sterile fashion.  We did a timeout procedure.  The area 1 cm cephalad to the sternal notch was injected with local.  A vertical incision was made.  Subcutaneous tissues were dissected down through the platysma revealing the strap muscles.  These were divided along the midline.  Further dissection revealed the thyroid isthmus.  This was divided with cautery.  We were careful to get good hemostasis.  This exposed the anterior surface of the trachea.  I then palpated that and the endotracheal tube was withdrawn several centimeters under direct palpation.  Next, I placed an Angiocath between the second third tracheal ring followed by a guidewire.  Next the small Blue Rhino dilator was placed.  Subsequently, the large Blue Rhino dilator was placed.  I then inserted a long proximal XLT trach over a dilator.  We noted this tracheostomy did not have a cuff so it was removed and replaced with a #6 Long proximal XLT trach over a dilator.  It passed easily.  The cuff was inflated and the trach was hooked up to the ventilator circuit.  Excellent  volume returns were obtained.  We ensured good hemostasis.  He was saturating in the 90s.  I placed two 2-0 Prolene sutures to close down the incision inferior to the trach.  We then used the same Prolene to secure the trach to the skin on each side.  A Velcro trach tie was applied.  All counts were correct.  He tolerated the procedure without apparent complication was taken directly back to the intensive care unit on the ventilator. PATIENT DISPOSITION:  ICU - intubated and critically ill.   Delay start of Pharmacological VTE agent (>24hrs) due to surgical blood loss or risk of bleeding:  no  Violeta Gelinas, MD, MPH, FACS Pager: [phone]  9/6/202412:07 PM

## 2023-07-01 NOTE — Progress Notes (Signed)
PT Cancellation Note  Patient Details Name: Russell Garcia MRN: [id number] DOB: 07/06/1956   Cancelled Treatment:    Reason Eval/Treat Not Completed: Other (comment) (pt with new trach placed today. OT re-evaled pt and pt remains heavily sedated. Will follow up later date, hopefully tomorrow as pt able and schedule allows.)   Wynn Maudlin, DPT Acute Rehabilitation Services Office [phone]  07/01/23 4:37 PM
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
## 2023-07-01 NOTE — Progress Notes (Signed)
Trauma Event Note   Noted that patient still has staples in place from brachial artery repair 8/14 by Dr. Karin Lieu. Notified MD requesting guidance r/t removal, no orders given at this time. Please address with rounding team.    Gayla Doss Daelyn Pettaway  Trauma Response RN  Please call TRN at [phone] for further assistance.
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
## 2023-07-01 NOTE — Progress Notes (Signed)
SLP Cancellation Note  Patient Details Name: Russell Garcia MRN: [id number] DOB: [birth year]   Cancelled treatment:       Reason Eval/Treat Not Completed: Patient with new tracheostomy. Orders for SLP eval and treat for PMSV and swallowing received. Will follow pt closely for readiness for SLP interventions as appropriate.    Gwynneth Aliment, M.A., CF-SLP Speech Language Pathology, Acute Rehabilitation Services  Secure Chat preferred [phone]  07/01/2023, 12:21 PM

## 2023-07-01 NOTE — Anesthesia Postprocedure Evaluation (Signed)
Anesthesia Post Note  Patient: Russell Garcia  Procedure(s) Performed: TRACHEOSTOMY     Patient location during evaluation: SICU Anesthesia Type: General Level of consciousness: sedated Pain management: pain level controlled Vital Signs Assessment: post-procedure vital signs reviewed and stable Respiratory status: patient connected to tracheostomy mask oxygen Cardiovascular status: stable Postop Assessment: no apparent nausea or vomiting Anesthetic complications: no   No notable events documented.  Last Vitals:  Vitals:   07/01/23 2130 07/01/23 2200  BP: (!) 142/76 (!) 164/86  Pulse: (!) 101 (!) 103  Resp: 18 (!) 21  Temp:    SpO2: 98% 98%    Last Pain:  Vitals:   07/01/23 2000  TempSrc: Axillary  PainSc:                  [name]

## 2023-07-01 NOTE — Transfer of Care (Signed)
Immediate Anesthesia Transfer of Care Note  Patient: Russell Garcia  Procedure(s) Performed: TRACHEOSTOMY  Patient Location: ICU  Anesthesia Type:General  Level of Consciousness: Patient remains intubated per anesthesia plan  Airway & Oxygen Therapy: Patient placed on Ventilator (see vital sign flow sheet for setting)  Post-op Assessment: Report given to RN  Post vital signs: Reviewed and stable  Last Vitals:  Vitals Value Taken Time  BP    Temp    Pulse 109 07/01/23 1229  Resp 9 07/01/23 1229  SpO2 96 % 07/01/23 1229  Vitals shown include unfiled device data.  Last Pain:  Vitals:   07/01/23 0800  TempSrc: Axillary  PainSc:          Complications: No notable events documented.
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
## 2023-07-01 NOTE — Progress Notes (Addendum)
Patient ID: Russell Garcia, male   DOB: 1956-01-05, 67 y.o.   MRN: [id number] I called his brother and let him know the tracheostomy has moved up to 1030 this morning. Please see Dr. Marvetta Gibbons note today. I agree with her assessment and plan.   Violeta Gelinas, MD, MPH, FACS Please use [email or web address] to contact on call provider

## 2023-07-01 NOTE — Progress Notes (Signed)
During wound vac change, verbal order received from Dr. Bedelia Person to remove [facility] therapy, and begin Q12hr wet-to-dry dressing changes. New orders placed, VAC therapy discontinued, and wet-to-dry dressing started at 1600.   Wendi Snipes. Barnett Abu, RN

## 2023-07-02 LAB — BASIC METABOLIC PANEL
Anion gap: 13 (ref 5–15)
BUN: 29 mg/dL — ABNORMAL HIGH (ref 8–23)
CO2: 28 mmol/L (ref 22–32)
Calcium: 8.3 mg/dL — ABNORMAL LOW (ref 8.9–10.3)
Chloride: 98 mmol/L (ref 98–111)
Creatinine, Ser: 0.86 mg/dL (ref 0.61–1.24)
GFR, Estimated: >60 mL/min (ref >60)
Glucose, Bld: 134 mg/dL — ABNORMAL HIGH (ref 70–99)
Potassium: 4.4 mmol/L (ref 3.5–5.1)
Sodium: 139 mmol/L (ref 135–145)

## 2023-07-02 LAB — CBC
HCT: 28.5 % — ABNORMAL LOW (ref 39.0–52.0)
Hemoglobin: 8.5 g/dL — ABNORMAL LOW (ref 13.0–17.0)
MCH: 26.3 pg (ref 26.0–34.0)
MCHC: 29.8 g/dL — ABNORMAL LOW (ref 30.0–36.0)
MCV: 88.2 fL (ref 80.0–100.0)
Platelets: 233 10*3/uL (ref 150–400)
RBC: 3.23 MIL/uL — ABNORMAL LOW (ref 4.22–5.81)
RDW: 17.3 % — ABNORMAL HIGH (ref 11.5–15.5)
WBC: 7.6 10*3/uL (ref 4.0–10.5)
nRBC: 0 % (ref 0.0–0.2)

## 2023-07-02 LAB — GLUCOSE, CAPILLARY
Glucose-Capillary: 127 mg/dL — ABNORMAL HIGH (ref 70–99)
Glucose-Capillary: 106 mg/dL — ABNORMAL HIGH (ref 70–99)
Glucose-Capillary: 115 mg/dL — ABNORMAL HIGH (ref 70–99)
Glucose-Capillary: 141 mg/dL — ABNORMAL HIGH (ref 70–99)
Glucose-Capillary: 133 mg/dL — ABNORMAL HIGH (ref 70–99)
Glucose-Capillary: 129 mg/dL — ABNORMAL HIGH (ref 70–99)

## 2023-07-02 MED ORDER — ATORVASTATIN CALCIUM 10 MG PO TABS
20.0000 mg | ORAL_TABLET | Freq: Every day | ORAL | Status: DC
  Administered 2023-07-02 – 2023-07-20 (×18): 20 mg
  Filled 2023-07-02 (×18): qty 2

## 2023-07-02 MED ORDER — TRACE MINERALS CU-MN-SE-ZN 300-55-60-3000 MCG/ML IV SOLN
INTRAVENOUS | Status: AC
  Filled 2023-07-02: qty 1139.2

## 2023-07-02 MED ORDER — HYDROCHLOROTHIAZIDE 25 MG PO TABS
25.0000 mg | ORAL_TABLET | Freq: Every day | ORAL | Status: DC
  Administered 2023-07-02 – 2023-07-12 (×11): 25 mg
  Filled 2023-07-02 (×11): qty 1

## 2023-07-02 NOTE — Progress Notes (Signed)
SLP Cancellation Note  Patient Details Name: THUNDER RIGONI MRN: [id number] DOB: [birth year]   Cancelled treatment:       Reason Eval/Treat Not Completed: Medical issues which prohibited therapy;Patient's level of consciousness;Other (comment) (patient remains intubated and sedated. SLP will continue to follow for readiness)  Angela Nevin, MA, CCC-SLP Speech Therapy
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
## 2023-07-02 NOTE — Progress Notes (Signed)
PT Cancellation Note  Patient Details Name: Russell Garcia MRN: [id number] DOB: [birth year]   Cancelled Treatment:    Reason Eval/Treat Not Completed: Medical issues which prohibited therapy  Pt remains intubated and sedated.  HR 126 bpm rest. Nursing reports working on decreasing sedation. Pt not able to participate at this time.  Will f/u later date.  Anise Salvo, PT Acute Rehab [facility] [phone]  Russell Garcia 07/02/2023, 10:30 AM

## 2023-07-03 ENCOUNTER — Inpatient Hospital Stay (HOSPITAL_COMMUNITY): Payer: Medicare HMO

## 2023-07-03 DIAGNOSIS — K567 Ileus, unspecified: Secondary | ICD-10-CM | POA: Diagnosis not present

## 2023-07-03 DIAGNOSIS — R1110 Vomiting, unspecified: Secondary | ICD-10-CM | POA: Diagnosis not present

## 2023-07-03 DIAGNOSIS — R918 Other nonspecific abnormal finding of lung field: Secondary | ICD-10-CM | POA: Diagnosis not present

## 2023-07-03 DIAGNOSIS — Z4682 Encounter for fitting and adjustment of non-vascular catheter: Secondary | ICD-10-CM | POA: Diagnosis not present

## 2023-07-03 DIAGNOSIS — Z430 Encounter for attention to tracheostomy: Secondary | ICD-10-CM | POA: Diagnosis not present

## 2023-07-03 LAB — BASIC METABOLIC PANEL
Anion gap: 12 (ref 5–15)
BUN: 29 mg/dL — ABNORMAL HIGH (ref 8–23)
CO2: 29 mmol/L (ref 22–32)
Calcium: 8.6 mg/dL — ABNORMAL LOW (ref 8.9–10.3)
Chloride: 98 mmol/L (ref 98–111)
Creatinine, Ser: 0.89 mg/dL (ref 0.61–1.24)
GFR, Estimated: >60 mL/min (ref >60)
Glucose, Bld: 143 mg/dL — ABNORMAL HIGH (ref 70–99)
Potassium: 4.4 mmol/L (ref 3.5–5.1)
Sodium: 139 mmol/L (ref 135–145)

## 2023-07-03 LAB — GLUCOSE, CAPILLARY
Glucose-Capillary: 119 mg/dL — ABNORMAL HIGH (ref 70–99)
Glucose-Capillary: 143 mg/dL — ABNORMAL HIGH (ref 70–99)
Glucose-Capillary: 138 mg/dL — ABNORMAL HIGH (ref 70–99)
Glucose-Capillary: 115 mg/dL — ABNORMAL HIGH (ref 70–99)
Glucose-Capillary: 149 mg/dL — ABNORMAL HIGH (ref 70–99)
Glucose-Capillary: 148 mg/dL — ABNORMAL HIGH (ref 70–99)

## 2023-07-03 MED ORDER — LABETALOL HCL 5 MG/ML IV SOLN
10.0000 mg | INTRAVENOUS | Status: DC | PRN
  Administered 2023-07-03 – 2023-07-08 (×12): 10 mg via INTRAVENOUS
  Filled 2023-07-03 (×12): qty 4

## 2023-07-03 MED ORDER — OXYCODONE HCL 5 MG PO TABS
5.0000 mg | ORAL_TABLET | ORAL | Status: DC | PRN
  Administered 2023-07-04 – 2023-07-17 (×19): 5 mg
  Filled 2023-07-03 (×20): qty 1

## 2023-07-03 MED ORDER — SODIUM CHLORIDE 0.9 % IV SOLN
12.5000 mg | Freq: Once | INTRAVENOUS | Status: AC
  Administered 2023-07-03: 12.5 mg via INTRAVENOUS
  Filled 2023-07-03: qty 0.5

## 2023-07-03 MED ORDER — TRACE MINERALS CU-MN-SE-ZN 300-55-60-3000 MCG/ML IV SOLN
INTRAVENOUS | Status: AC
  Filled 2023-07-03: qty 1139.2
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
## 2023-07-03 NOTE — Progress Notes (Signed)
Verbal order from Dr. Freida Busman to keep tube feeds off, but to restart enteral medications later in the night. SBP goals are now <170, PRN labetolol now ordered and administered, as well as scheduled metoprolol, and PRN dilaudid given to reach SBP goal.   Wendi Snipes. Barnett Abu, RN
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
## 2023-07-03 NOTE — Progress Notes (Addendum)
During 0800 turn, patient had an episode of emesis. Trickle feeds turned off, PRN Zofran administered, and Dr. Freida Busman notified at bedside. Tube feed remains off.  1400- Another episode of emesis, PRN zofran administered, tube feeds remain off. Dr. Freida Busman notified, and orders for KUB, abdominal x-ray, and IV phenergan. Patient not tolerating per tube medications at this time.  Wendi Snipes. Barnett Abu, RN

## 2023-07-03 NOTE — Progress Notes (Signed)
Pt placed on 40% atc  ~ 5 mins. Pt vomited. Cuff reinflated and pt placed back on vent 5/5 and tolerating well. No vomitus suctioned from trach

## 2023-07-04 ENCOUNTER — Inpatient Hospital Stay (HOSPITAL_COMMUNITY): Payer: Medicare HMO

## 2023-07-04 DIAGNOSIS — Z4682 Encounter for fitting and adjustment of non-vascular catheter: Secondary | ICD-10-CM | POA: Diagnosis not present

## 2023-07-04 LAB — CBC
HCT: 25.6 % — ABNORMAL LOW (ref 39.0–52.0)
Hemoglobin: 7.8 g/dL — ABNORMAL LOW (ref 13.0–17.0)
MCH: 26.4 pg (ref 26.0–34.0)
MCHC: 30.5 g/dL (ref 30.0–36.0)
MCV: 86.8 fL (ref 80.0–100.0)
Platelets: 198 10*3/uL (ref 150–400)
RBC: 2.95 MIL/uL — ABNORMAL LOW (ref 4.22–5.81)
RDW: 16.9 % — ABNORMAL HIGH (ref 11.5–15.5)
WBC: 9.3 10*3/uL (ref 4.0–10.5)
nRBC: 0 % (ref 0.0–0.2)

## 2023-07-04 LAB — COMPREHENSIVE METABOLIC PANEL
ALT: 34 U/L (ref 0–44)
AST: 23 U/L (ref 15–41)
Albumin: 2.2 g/dL — ABNORMAL LOW (ref 3.5–5.0)
Alkaline Phosphatase: 60 U/L (ref 38–126)
Anion gap: 8 (ref 5–15)
BUN: 26 mg/dL — ABNORMAL HIGH (ref 8–23)
CO2: 29 mmol/L (ref 22–32)
Calcium: 8.5 mg/dL — ABNORMAL LOW (ref 8.9–10.3)
Chloride: 100 mmol/L (ref 98–111)
Creatinine, Ser: 0.93 mg/dL (ref 0.61–1.24)
GFR, Estimated: >60 mL/min (ref >60)
Glucose, Bld: 162 mg/dL — ABNORMAL HIGH (ref 70–99)
Potassium: 4.3 mmol/L (ref 3.5–5.1)
Sodium: 137 mmol/L (ref 135–145)
Total Bilirubin: 0.6 mg/dL (ref 0.3–1.2)
Total Protein: 7 g/dL (ref 6.5–8.1)

## 2023-07-04 LAB — GLUCOSE, CAPILLARY
Glucose-Capillary: 115 mg/dL — ABNORMAL HIGH (ref 70–99)
Glucose-Capillary: 121 mg/dL — ABNORMAL HIGH (ref 70–99)
Glucose-Capillary: 131 mg/dL — ABNORMAL HIGH (ref 70–99)
Glucose-Capillary: 132 mg/dL — ABNORMAL HIGH (ref 70–99)
Glucose-Capillary: 133 mg/dL — ABNORMAL HIGH (ref 70–99)
Glucose-Capillary: 133 mg/dL — ABNORMAL HIGH (ref 70–99)
Glucose-Capillary: 149 mg/dL — ABNORMAL HIGH (ref 70–99)

## 2023-07-04 LAB — MAGNESIUM: Magnesium: 2.1 mg/dL (ref 1.7–2.4)

## 2023-07-04 LAB — PHOSPHORUS: Phosphorus: 2.9 mg/dL (ref 2.5–4.6)

## 2023-07-04 LAB — FREE VALPROIC ACID (DEPAKOTE): Valproic Acid, Free: 17.4 ug/mL (ref 6.0–22.0)

## 2023-07-04 LAB — HEPARIN ANTI-XA: Heparin LMW: 0.79 [IU]/mL

## 2023-07-04 LAB — TRIGLYCERIDES: Triglycerides: 101 mg/dL (ref <150)

## 2023-07-04 MED ORDER — METOCLOPRAMIDE HCL 5 MG/ML IJ SOLN
5.0000 mg | Freq: Three times a day (TID) | INTRAMUSCULAR | Status: AC
  Administered 2023-07-04 – 2023-07-06 (×9): 5 mg via INTRAVENOUS
  Filled 2023-07-04 (×9): qty 2

## 2023-07-04 MED ORDER — ORAL CARE MOUTH RINSE
15.0000 mL | OROMUCOSAL | Status: DC
  Administered 2023-07-04 – 2023-07-10 (×77): 15 mL via OROMUCOSAL

## 2023-07-04 MED ORDER — NICARDIPINE HCL IN NACL 20-0.86 MG/200ML-% IV SOLN
INTRAVENOUS | Status: AC
  Filled 2023-07-04: qty 200

## 2023-07-04 MED ORDER — NICARDIPINE HCL IN NACL 20-0.86 MG/200ML-% IV SOLN
3.0000 mg/h | INTRAVENOUS | Status: DC
  Administered 2023-07-04 – 2023-07-05 (×4): 5 mg/h via INTRAVENOUS
  Administered 2023-07-05: 7.5 mg/h via INTRAVENOUS
  Administered 2023-07-05: 10 mg/h via INTRAVENOUS
  Administered 2023-07-06: 5 mg/h via INTRAVENOUS
  Filled 2023-07-04 (×3): qty 200
  Filled 2023-07-04: qty 400
  Filled 2023-07-04: qty 200

## 2023-07-04 MED ORDER — SENNA 8.6 MG PO TABS
2.0000 | ORAL_TABLET | Freq: Every day | ORAL | Status: DC
  Administered 2023-07-04 – 2023-07-20 (×7): 17.2 mg
  Filled 2023-07-04 (×11): qty 2

## 2023-07-04 MED ORDER — ORAL CARE MOUTH RINSE: 15.0000 mL | OROMUCOSAL | Status: DC | PRN

## 2023-07-04 MED ORDER — ASPIRIN 300 MG RE SUPP
150.0000 mg | Freq: Every day | RECTAL | Status: DC
  Administered 2023-07-04: 150 mg via RECTAL
  Filled 2023-07-04: qty 1

## 2023-07-04 MED ORDER — NICARDIPINE HCL IN NACL 40-0.83 MG/200ML-% IV SOLN
3.0000 mg/h | INTRAVENOUS | Status: DC
  Filled 2023-07-04: qty 200

## 2023-07-04 MED ORDER — TRACE MINERALS CU-MN-SE-ZN 300-55-60-3000 MCG/ML IV SOLN
INTRAVENOUS | Status: AC
  Filled 2023-07-04: qty 1139.2

## 2023-07-04 MED ORDER — DOCUSATE SODIUM 100 MG PO CAPS: 100.0000 mg | ORAL_CAPSULE | Freq: Two times a day (BID) | ORAL | Status: DC

## 2023-07-04 MED ORDER — POLYETHYLENE GLYCOL 3350 17 G PO PACK
17.0000 g | PACK | Freq: Two times a day (BID) | ORAL | Status: DC
  Administered 2023-07-04 – 2023-07-11 (×9): 17 g
  Filled 2023-07-04 (×9): qty 1

## 2023-07-04 MED ORDER — DOCUSATE SODIUM 50 MG/5ML PO LIQD
100.0000 mg | Freq: Two times a day (BID) | ORAL | Status: DC
  Administered 2023-07-04 – 2023-07-12 (×12): 100 mg
  Filled 2023-07-04 (×13): qty 10
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
## 2023-07-04 NOTE — Procedures (Signed)
Cortrak  Person Inserting Tube:  Russell Garcia, RD Tube Type:  Cortrak - 55 inches Tube Size:  10 Tube Location:  Left nare Secured by: Bridle Technique Used to Measure Tube Placement:  Marking at nare/corner of mouth Cortrak Secured At:  106 cm   Cortrak Tube Team Note:  Consult received to place a Cortrak feeding tube.   X-ray is required, abdominal x-ray has been ordered by the Cortrak team. Please confirm tube placement before using the Cortrak tube.   If the tube becomes dislodged please keep the tube and contact the Cortrak team at [email or web address] for replacement.  If after hours and replacement cannot be delayed, place a NG tube and confirm placement with an abdominal x-ray.    Shelle Iron RD, LDN For contact information, refer to [facility].
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
## 2023-07-04 NOTE — Progress Notes (Signed)
Inpatient Rehab Admissions Coordinator:   Per therapy recommendations,  patient was screened for CIR candidacy by Megan Salon, MS, CCC-SLP  At this time, Pt. is not medically ready for CIR. He continues to need TPN and vent support.  I will not pursue a rehab consult for this Pt. at this time, but CIR admissions team will follow and monitor for medical readiness and place consult order if Pt. appears to be an appropriate candidate. Please contact me with any questions.   Megan Salon, MS, CCC-SLP Rehab Admissions Coordinator  [phone] (celll) [phone] (office)

## 2023-07-04 NOTE — Progress Notes (Signed)
Tpn hung on patient without incident. Inspected dressing and found medial side to be peeling. Primary rn at bedside and aware. Stated he would change it after current med administration. Encouraged to reach back out to [facility]AST team with any other concerns

## 2023-07-04 NOTE — Progress Notes (Signed)
RT NOTE: patient placed on 40% ATC at 0750.  Currently tolerating well.  Will continue to monitor.

## 2023-07-04 NOTE — Progress Notes (Signed)
SLP Cancellation Note  Patient Details Name: Russell Garcia MRN: [id number] DOB: 02/29/1956   Cancelled treatment:       Reason Eval/Treat Not Completed: Medical issues which prohibited therapy. Still on vent   Royce Macadamia 07/04/2023, 7:50 AM
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
## 2023-07-04 NOTE — Progress Notes (Signed)
RT NOTE: patient placed back on ventilator, on full support settings, due to patient having a decrease in sats to 88%-90%.  Sat goal to be >92%.  Tolerating well at this time.  Will continue to monitor.

## 2023-07-05 ENCOUNTER — Inpatient Hospital Stay (HOSPITAL_COMMUNITY): Payer: Medicare HMO

## 2023-07-05 DIAGNOSIS — R140 Abdominal distension (gaseous): Secondary | ICD-10-CM | POA: Diagnosis not present

## 2023-07-05 DIAGNOSIS — Z4682 Encounter for fitting and adjustment of non-vascular catheter: Secondary | ICD-10-CM | POA: Diagnosis not present

## 2023-07-05 LAB — GLUCOSE, CAPILLARY
Glucose-Capillary: 117 mg/dL — ABNORMAL HIGH (ref 70–99)
Glucose-Capillary: 126 mg/dL — ABNORMAL HIGH (ref 70–99)
Glucose-Capillary: 128 mg/dL — ABNORMAL HIGH (ref 70–99)
Glucose-Capillary: 129 mg/dL — ABNORMAL HIGH (ref 70–99)
Glucose-Capillary: 132 mg/dL — ABNORMAL HIGH (ref 70–99)
Glucose-Capillary: 139 mg/dL — ABNORMAL HIGH (ref 70–99)

## 2023-07-05 LAB — CULTURE, RESPIRATORY W GRAM STAIN

## 2023-07-05 MED ORDER — TRACE MINERALS CU-MN-SE-ZN 300-55-60-3000 MCG/ML IV SOLN
INTRAVENOUS | Status: AC
  Filled 2023-07-05: qty 1139.2

## 2023-07-05 MED ORDER — ASPIRIN 81 MG PO CHEW
81.0000 mg | CHEWABLE_TABLET | Freq: Every day | ORAL | Status: DC
  Administered 2023-07-05 – 2023-07-20 (×14): 81 mg
  Filled 2023-07-05 (×15): qty 1

## 2023-07-05 MED ORDER — ASPIRIN 81 MG PO CHEW: 81.0000 mg | CHEWABLE_TABLET | Freq: Every day | ORAL | Status: DC

## 2023-07-05 MED ORDER — ASPIRIN 300 MG RE SUPP: 150.0000 mg | Freq: Every day | RECTAL | Status: DC

## 2023-07-05 NOTE — Progress Notes (Signed)
RT NOTE: patient placed on 40% ATC.  Tolerating well at this time.  Will continue to monitor.
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
## 2023-07-05 NOTE — Progress Notes (Signed)
SLP Cancellation Note  Patient Details Name: Russell Garcia MRN: [id number] DOB: 12-06-1955   Cancelled treatment:       Reason Eval/Treat Not Completed: Patient not medically ready. RN requested SLP defer PMSV assessment today. Will f/u   Darielys Giglia, Riley Nearing 07/05/2023, 1:36 PM
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
## 2023-07-05 NOTE — Progress Notes (Signed)
Beginning of shift NG in place to LIS, Cortrak with trickle TF. Cortrak unable to flush/push meds. MD Janee Morn made aware 0930. RD to attempt manipulation of cortrak--still unsuccessful to flush. KUB obtained. Trickle TF stopped per Janee Morn MD and okay to give meds per NG until tomorrow. 1000 meds given without issue via NG. Returned to room 1115 to find small amount emesis and patient c/o coughing/choking/regurgitation. Trach suctioned without secretions, orally suctioned with copious secretions. MD B. Thompson aware; holding per tube meds at this time.   1300 pt increased RR and some WOB, placed from TC to vent.
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
## 2023-07-05 NOTE — Progress Notes (Signed)
Cortrak Tube Team Note:  Notified by RN tube would not flush. TF still going at trickle but unable to flush tube.  Re-inserted stylet, not issues identified but continue to be unable to flush tube.  Tube pulled out to 100 cm.   X-ray is required, abdominal x-ray has been ordered by the Cortrak team. Please confirm tube placement before using the Cortrak tube.   If the tube becomes dislodged please keep the tube and contact the Cortrak team at [email or web address] for replacement.  If after hours and replacement cannot be delayed, place a NG tube and confirm placement with an abdominal x-ray.    Cammy Copa., RD, LDN, CNSC See AMiON for contact information

## 2023-07-06 ENCOUNTER — Inpatient Hospital Stay (HOSPITAL_COMMUNITY): Payer: Medicare HMO

## 2023-07-06 DIAGNOSIS — R0989 Other specified symptoms and signs involving the circulatory and respiratory systems: Secondary | ICD-10-CM | POA: Diagnosis not present

## 2023-07-06 DIAGNOSIS — Z4682 Encounter for fitting and adjustment of non-vascular catheter: Secondary | ICD-10-CM | POA: Diagnosis not present

## 2023-07-06 LAB — GLUCOSE, CAPILLARY
Glucose-Capillary: 126 mg/dL — ABNORMAL HIGH (ref 70–99)
Glucose-Capillary: 146 mg/dL — ABNORMAL HIGH (ref 70–99)
Glucose-Capillary: 136 mg/dL — ABNORMAL HIGH (ref 70–99)
Glucose-Capillary: 118 mg/dL — ABNORMAL HIGH (ref 70–99)
Glucose-Capillary: 113 mg/dL — ABNORMAL HIGH (ref 70–99)
Glucose-Capillary: 130 mg/dL — ABNORMAL HIGH (ref 70–99)

## 2023-07-06 LAB — CBC
HCT: 23.5 % — ABNORMAL LOW (ref 39.0–52.0)
Hemoglobin: 7.3 g/dL — ABNORMAL LOW (ref 13.0–17.0)
MCH: 26.3 pg (ref 26.0–34.0)
MCHC: 31.1 g/dL (ref 30.0–36.0)
MCV: 84.5 fL (ref 80.0–100.0)
Platelets: 184 10*3/uL (ref 150–400)
RBC: 2.78 MIL/uL — ABNORMAL LOW (ref 4.22–5.81)
RDW: 17.1 % — ABNORMAL HIGH (ref 11.5–15.5)
WBC: 7.4 10*3/uL (ref 4.0–10.5)
nRBC: 0 % (ref 0.0–0.2)

## 2023-07-06 LAB — BASIC METABOLIC PANEL
Anion gap: 7 (ref 5–15)
BUN: 27 mg/dL — ABNORMAL HIGH (ref 8–23)
CO2: 28 mmol/L (ref 22–32)
Calcium: 7.9 mg/dL — ABNORMAL LOW (ref 8.9–10.3)
Chloride: 101 mmol/L (ref 98–111)
Creatinine, Ser: 0.83 mg/dL (ref 0.61–1.24)
GFR, Estimated: >60 mL/min (ref >60)
Glucose, Bld: 121 mg/dL — ABNORMAL HIGH (ref 70–99)
Potassium: 3.7 mmol/L (ref 3.5–5.1)
Sodium: 136 mmol/L (ref 135–145)

## 2023-07-06 MED ORDER — POTASSIUM CHLORIDE 20 MEQ PO PACK
20.0000 meq | PACK | Freq: Once | ORAL | Status: AC
  Administered 2023-07-06: 20 meq
  Filled 2023-07-06: qty 1

## 2023-07-06 MED ORDER — CLONAZEPAM 0.25 MG PO TBDP: 1.0000 mg | ORAL_TABLET | Freq: Every day | ORAL | Status: DC

## 2023-07-06 MED ORDER — FUROSEMIDE 10 MG/ML IJ SOLN
60.0000 mg | Freq: Once | INTRAMUSCULAR | Status: AC
  Administered 2023-07-06: 60 mg via INTRAVENOUS
  Filled 2023-07-06: qty 6

## 2023-07-06 MED ORDER — CLONAZEPAM 0.25 MG PO TBDP: 0.5000 mg | ORAL_TABLET | Freq: Every day | ORAL | Status: DC

## 2023-07-06 MED ORDER — CLONAZEPAM 1 MG PO TABS
1.0000 mg | ORAL_TABLET | Freq: Every day | ORAL | Status: DC
  Administered 2023-07-06: 1 mg
  Filled 2023-07-06: qty 1

## 2023-07-06 MED ORDER — TRACE MINERALS CU-MN-SE-ZN 300-55-60-3000 MCG/ML IV SOLN
INTRAVENOUS | Status: AC
  Filled 2023-07-06: qty 1139.2

## 2023-07-06 MED ORDER — PIVOT 1.5 CAL PO LIQD
1000.0000 mL | ORAL | Status: DC
  Administered 2023-07-06 – 2023-07-11 (×5): 1000 mL

## 2023-07-06 MED ORDER — CLONAZEPAM 0.25 MG PO TBDP: 1.0000 mg | ORAL_TABLET | Freq: Once | ORAL | Status: DC

## 2023-07-06 MED ORDER — CEFAZOLIN SODIUM-DEXTROSE 1-4 GM/50ML-% IV SOLN
1.0000 g | Freq: Three times a day (TID) | INTRAVENOUS | Status: DC
  Administered 2023-07-06 – 2023-07-07 (×3): 1 g via INTRAVENOUS
  Filled 2023-07-06 (×3): qty 50

## 2023-07-06 MED ORDER — PROSOURCE TF20 ENFIT COMPATIBL EN LIQD
60.0000 mL | Freq: Two times a day (BID) | ENTERAL | Status: DC
  Administered 2023-07-06 – 2023-07-20 (×28): 60 mL
  Filled 2023-07-06 (×29): qty 60

## 2023-07-06 MED ORDER — CLONAZEPAM 1 MG PO TABS: 1.0000 mg | ORAL_TABLET | Freq: Once | ORAL | Status: DC

## 2023-07-06 MED ORDER — CLONAZEPAM 0.5 MG PO TABS
0.5000 mg | ORAL_TABLET | Freq: Every day | ORAL | Status: AC
  Administered 2023-07-07 – 2023-07-11 (×5): 0.5 mg
  Filled 2023-07-06 (×5): qty 1

## 2023-07-06 NOTE — Progress Notes (Signed)
SLP Cancellation Note  Patient Details Name: Russell Garcia MRN: [id number] DOB: Jan 19, 1956   Cancelled treatment:       Reason Eval/Treat Not Completed: Medical issues which prohibited therapy. Patient had just transferred over to [facility] when seen this am around 900 am and also has some bleeding from trach site. Will hold eval today and f/u next date.   Abdelrahman Nair MA, CCC-SLP    Russell Garcia 07/06/2023, 11:59 AM
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
## 2023-07-06 NOTE — Progress Notes (Signed)
Cortrak Tube Team Note:  Cortrak consult received due to Cortrak tube not flushing.  Re-inserted stylet and able to remove large loop in tube close to GE junction. RD and RN both able to flush tube now without issue. Tube is at 98 cm at the left nare.  X-ray is required, abdominal x-ray has been ordered by the Cortrak team. Please confirm tube placement before using the Cortrak tube.   If the tube becomes dislodged please keep the tube and contact the Cortrak team at [email or web address] for replacement.  If after hours and replacement cannot be delayed, place a NG tube and confirm placement with an abdominal x-ray.    Mertie Clause, MS, RD, LDN Registered Dietitian II Please see AMiON for contact information.
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
## 2023-07-06 NOTE — Progress Notes (Signed)
RT NOTE: patient placed on 35% ATC.  Currently tolerating well at this time.  RN and RN noted bleeding around trach (looks like source from suture site).  Gauze placed.  Will inform MD.  Will continue to monitor.

## 2023-07-07 ENCOUNTER — Inpatient Hospital Stay (HOSPITAL_COMMUNITY): Payer: Medicare HMO

## 2023-07-07 DIAGNOSIS — Z4682 Encounter for fitting and adjustment of non-vascular catheter: Secondary | ICD-10-CM | POA: Diagnosis not present

## 2023-07-07 LAB — CBC
HCT: 21.9 % — ABNORMAL LOW (ref 39.0–52.0)
Hemoglobin: 6.6 g/dL — CL (ref 13.0–17.0)
MCH: 26.7 pg (ref 26.0–34.0)
MCHC: 30.1 g/dL (ref 30.0–36.0)
MCV: 88.7 fL (ref 80.0–100.0)
Platelets: 155 10*3/uL (ref 150–400)
RBC: 2.47 MIL/uL — ABNORMAL LOW (ref 4.22–5.81)
RDW: 17.6 % — ABNORMAL HIGH (ref 11.5–15.5)
WBC: 6.2 10*3/uL (ref 4.0–10.5)
nRBC: 0 % (ref 0.0–0.2)

## 2023-07-07 LAB — COMPREHENSIVE METABOLIC PANEL
ALT: 35 U/L (ref 0–44)
AST: 21 U/L (ref 15–41)
Albumin: 2.2 g/dL — ABNORMAL LOW (ref 3.5–5.0)
Alkaline Phosphatase: 57 U/L (ref 38–126)
Anion gap: 10 (ref 5–15)
BUN: 44 mg/dL — ABNORMAL HIGH (ref 8–23)
CO2: 29 mmol/L (ref 22–32)
Calcium: 8.5 mg/dL — ABNORMAL LOW (ref 8.9–10.3)
Chloride: 99 mmol/L (ref 98–111)
Creatinine, Ser: 1.08 mg/dL (ref 0.61–1.24)
GFR, Estimated: >60 mL/min (ref >60)
Glucose, Bld: 118 mg/dL — ABNORMAL HIGH (ref 70–99)
Potassium: 4.2 mmol/L (ref 3.5–5.1)
Sodium: 138 mmol/L (ref 135–145)
Total Bilirubin: 0.2 mg/dL — ABNORMAL LOW (ref 0.3–1.2)
Total Protein: 6.9 g/dL (ref 6.5–8.1)

## 2023-07-07 LAB — HEMOGLOBIN AND HEMATOCRIT, BLOOD
HCT: 27.9 % — ABNORMAL LOW (ref 39.0–52.0)
Hemoglobin: 8.6 g/dL — ABNORMAL LOW (ref 13.0–17.0)

## 2023-07-07 LAB — PHOSPHORUS: Phosphorus: 3.3 mg/dL (ref 2.5–4.6)

## 2023-07-07 LAB — GLUCOSE, CAPILLARY
Glucose-Capillary: 132 mg/dL — ABNORMAL HIGH (ref 70–99)
Glucose-Capillary: 105 mg/dL — ABNORMAL HIGH (ref 70–99)
Glucose-Capillary: 114 mg/dL — ABNORMAL HIGH (ref 70–99)
Glucose-Capillary: 136 mg/dL — ABNORMAL HIGH (ref 70–99)
Glucose-Capillary: 130 mg/dL — ABNORMAL HIGH (ref 70–99)
Glucose-Capillary: 151 mg/dL — ABNORMAL HIGH (ref 70–99)

## 2023-07-07 LAB — PREPARE RBC (CROSSMATCH)

## 2023-07-07 LAB — MAGNESIUM: Magnesium: 2.2 mg/dL (ref 1.7–2.4)

## 2023-07-07 MED ORDER — CLONAZEPAM 0.25 MG PO TBDP
0.2500 mg | ORAL_TABLET | Freq: Once | ORAL | Status: AC
  Administered 2023-07-13: 0.25 mg
  Filled 2023-07-07: qty 1

## 2023-07-07 MED ORDER — CLONAZEPAM 1 MG PO TABS
1.0000 mg | ORAL_TABLET | Freq: Every day | ORAL | Status: AC
  Administered 2023-07-07 – 2023-07-08 (×2): 1 mg
  Filled 2023-07-07 (×2): qty 1

## 2023-07-07 MED ORDER — SODIUM CHLORIDE 0.9% IV SOLUTION: Freq: Once | INTRAVENOUS | Status: AC

## 2023-07-07 MED ORDER — CLONAZEPAM 1 MG PO TABS: 1.0000 mg | ORAL_TABLET | Freq: Every day | ORAL | Status: DC

## 2023-07-07 MED ORDER — METOPROLOL TARTRATE 25 MG/10 ML ORAL SUSPENSION
12.5000 mg | Freq: Four times a day (QID) | ORAL | Status: DC
  Administered 2023-07-07 – 2023-07-08 (×4): 12.5 mg
  Filled 2023-07-07 (×4): qty 10

## 2023-07-07 MED ORDER — CLONAZEPAM 0.5 MG PO TABS: 0.5000 mg | ORAL_TABLET | Freq: Every day | ORAL | Status: DC

## 2023-07-07 MED ORDER — CLONAZEPAM 0.5 MG PO TABS
0.5000 mg | ORAL_TABLET | Freq: Every day | ORAL | Status: AC
  Administered 2023-07-09 – 2023-07-12 (×4): 0.5 mg
  Filled 2023-07-07 (×4): qty 1

## 2023-07-07 MED ORDER — FUROSEMIDE 10 MG/ML IJ SOLN
60.0000 mg | Freq: Three times a day (TID) | INTRAMUSCULAR | Status: AC
  Administered 2023-07-07 (×2): 60 mg via INTRAVENOUS
  Filled 2023-07-07 (×2): qty 6

## 2023-07-07 MED ORDER — HYDROMORPHONE HCL 1 MG/ML IJ SOLN
0.5000 mg | Freq: Four times a day (QID) | INTRAMUSCULAR | Status: DC | PRN
  Administered 2023-07-08 – 2023-07-21 (×4): 1 mg via INTRAVENOUS
  Filled 2023-07-07 (×4): qty 1

## 2023-07-07 MED ORDER — TRACE MINERALS CU-MN-SE-ZN 300-55-60-3000 MCG/ML IV SOLN
INTRAVENOUS | Status: AC
  Filled 2023-07-07: qty 1139.2

## 2023-07-07 MED ORDER — CEFAZOLIN SODIUM-DEXTROSE 2-4 GM/100ML-% IV SOLN
2.0000 g | Freq: Three times a day (TID) | INTRAVENOUS | Status: AC
  Administered 2023-07-07 – 2023-07-11 (×12): 2 g via INTRAVENOUS
  Filled 2023-07-07 (×12): qty 100

## 2023-07-07 NOTE — Plan of Care (Signed)
  Problem: Health Behavior/Discharge Planning: Goal: Ability to manage health-related needs will improve Outcome: Progressing   Problem: Clinical Measurements: Goal: Ability to maintain clinical measurements within normal limits will improve Outcome: Progressing Goal: Will remain free from infection Outcome: Progressing Goal: Diagnostic test results will improve Outcome: Progressing Goal: Respiratory complications will improve Outcome: Progressing Goal: Cardiovascular complication will be avoided Outcome: Progressing   Problem: Activity: Goal: Risk for activity intolerance will decrease Outcome: Progressing   Problem: Nutrition: Goal: Adequate nutrition will be maintained Outcome: Progressing   Problem: Coping: Goal: Level of anxiety will decrease Outcome: Progressing   Problem: Elimination: Goal: Will not experience complications related to bowel motility Outcome: Progressing Goal: Will not experience complications related to urinary retention Outcome: Progressing   Problem: Pain Managment: Goal: General experience of comfort will improve Outcome: Progressing   Problem: Safety: Goal: Ability to remain free from injury will improve Outcome: Progressing   Problem: Skin Integrity: Goal: Risk for impaired skin integrity will decrease Outcome: Progressing   Problem: Education: Goal: Knowledge about tracheostomy care/management will improve Outcome: Progressing   Problem: Activity: Goal: Ability to tolerate increased activity will improve Outcome: Progressing   Problem: Health Behavior/Discharge Planning: Goal: Ability to manage tracheostomy will improve Outcome: Progressing   Problem: Respiratory: Goal: Patent airway maintenance will improve Outcome: Progressing   Problem: Role Relationship: Goal: Ability to communicate will improve Outcome: Progressing
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
## 2023-07-07 NOTE — Progress Notes (Signed)
Pt's Abd xray not read at this time. Myself, chrg RN, and 3rd RN visualized the image, which appears to be in the stomach. Per Dr. Janee Morn "pt's cortrak should be post-pyloric". Also gave V.O., "ok to give meds through cortrak, but do not restart TF until reading has been confirmed."

## 2023-07-07 NOTE — Progress Notes (Signed)
Pt. Placed back on ventilator due to increased WOB. Pt. Is currently stable.WOB decreased. Will continue to monitor.
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
## 2023-07-07 NOTE — Progress Notes (Signed)
Pt. Is continuing to tolerate trach collar at this time no increased WOB noted at this time VS stable. RT will continue to monitor

## 2023-07-08 ENCOUNTER — Encounter (HOSPITAL_COMMUNITY): Payer: Self-pay | Admitting: General Surgery

## 2023-07-08 LAB — CBC
HCT: 28.2 % — ABNORMAL LOW (ref 39.0–52.0)
Hemoglobin: 8.5 g/dL — ABNORMAL LOW (ref 13.0–17.0)
MCH: 27.7 pg (ref 26.0–34.0)
MCHC: 30.1 g/dL (ref 30.0–36.0)
MCV: 91.9 fL (ref 80.0–100.0)
Platelets: 179 10*3/uL (ref 150–400)
RBC: 3.07 MIL/uL — ABNORMAL LOW (ref 4.22–5.81)
RDW: 17.9 % — ABNORMAL HIGH (ref 11.5–15.5)
WBC: 7.4 10*3/uL (ref 4.0–10.5)
nRBC: 0 % (ref 0.0–0.2)

## 2023-07-08 LAB — CULTURE, BLOOD (ROUTINE X 2)
Culture: NO GROWTH
Culture: NO GROWTH
Special Requests: ADEQUATE
Special Requests: ADEQUATE

## 2023-07-08 LAB — BPAM RBC
Blood Product Expiration Date: 202409282359
ISSUE DATE / TIME: 202409120837
Unit Type and Rh: 6200

## 2023-07-08 LAB — TYPE AND SCREEN
ABO/RH(D): A POS
Antibody Screen: NEGATIVE
Unit division: 0

## 2023-07-08 LAB — GLUCOSE, CAPILLARY
Glucose-Capillary: 161 mg/dL — ABNORMAL HIGH (ref 70–99)
Glucose-Capillary: 141 mg/dL — ABNORMAL HIGH (ref 70–99)
Glucose-Capillary: 154 mg/dL — ABNORMAL HIGH (ref 70–99)
Glucose-Capillary: 152 mg/dL — ABNORMAL HIGH (ref 70–99)
Glucose-Capillary: 130 mg/dL — ABNORMAL HIGH (ref 70–99)
Glucose-Capillary: 141 mg/dL — ABNORMAL HIGH (ref 70–99)

## 2023-07-08 LAB — BASIC METABOLIC PANEL
Anion gap: 10 (ref 5–15)
BUN: 47 mg/dL — ABNORMAL HIGH (ref 8–23)
CO2: 30 mmol/L (ref 22–32)
Calcium: 8.6 mg/dL — ABNORMAL LOW (ref 8.9–10.3)
Chloride: 97 mmol/L — ABNORMAL LOW (ref 98–111)
Creatinine, Ser: 0.9 mg/dL (ref 0.61–1.24)
GFR, Estimated: >60 mL/min (ref >60)
Glucose, Bld: 173 mg/dL — ABNORMAL HIGH (ref 70–99)
Potassium: 3.9 mmol/L (ref 3.5–5.1)
Sodium: 137 mmol/L (ref 135–145)

## 2023-07-08 MED ORDER — ENOXAPARIN SODIUM 120 MG/0.8ML IJ SOSY
120.0000 mg | PREFILLED_SYRINGE | Freq: Two times a day (BID) | INTRAMUSCULAR | Status: DC
  Filled 2023-07-08 (×5): qty 0.8

## 2023-07-08 MED ORDER — HYDRALAZINE HCL 20 MG/ML IJ SOLN
10.0000 mg | INTRAMUSCULAR | Status: DC | PRN
  Administered 2023-07-09 – 2023-07-13 (×2): 10 mg via INTRAVENOUS
  Filled 2023-07-08 (×2): qty 1

## 2023-07-08 MED ORDER — METOPROLOL TARTRATE 25 MG/10 ML ORAL SUSPENSION
37.5000 mg | Freq: Four times a day (QID) | ORAL | Status: DC
  Administered 2023-07-08 – 2023-07-12 (×19): 37.5 mg
  Filled 2023-07-08: qty 15
  Filled 2023-07-08 (×9): qty 20
  Filled 2023-07-08: qty 15
  Filled 2023-07-08 (×4): qty 20
  Filled 2023-07-08: qty 15
  Filled 2023-07-08 (×6): qty 20
  Filled 2023-07-08: qty 15
  Filled 2023-07-08 (×2): qty 20

## 2023-07-08 NOTE — Plan of Care (Signed)
Problem: Health Behavior/Discharge Planning: Goal: Ability to manage health-related needs will improve Outcome: Progressing   Problem: Clinical Measurements: Goal: Ability to maintain clinical measurements within normal limits will improve Outcome: Progressing Goal: Will remain free from infection Outcome: Progressing Goal: Diagnostic test results will improve Outcome: Progressing   Problem: Activity: Goal: Risk for activity intolerance will decrease Outcome: Progressing   Problem: Nutrition: Goal: Adequate nutrition will be maintained Outcome: Progressing

## 2023-07-08 NOTE — Progress Notes (Signed)
Pt vomited while working with therapy. Tube feeds held and NG tube placed back to low intermittent suction.   Robina Ade, RN
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
## 2023-07-08 NOTE — Progress Notes (Signed)
Pt noted to have blood coming from trach and around trach. Pt also began coughing up blood from his mouth and had blood coming up through his NG tube. A large clot was coughed out of his trach. Russell Garcia, TRN came to bedside and placed surgicel per Dr. Bedelia Person. Dr. Sheliah Hatch also made aware and orders from Dr. Bedelia Person to hold nighttime dose of Lovenox.  Robina Ade, RN
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
## 2023-07-08 NOTE — Progress Notes (Signed)
RT removed trach sutures (2) per protocol and MD order. Patient tolerated well. RN aware. RT will continue to monitor.

## 2023-07-08 NOTE — Progress Notes (Signed)
Per Dr. Bedelia Person and Kendell Bane, RD, pt's tube feeds should not be held for vomiting unless emesis appears to be tube feeding since pt's coretrak is post-pyloric.  Robina Ade, RN

## 2023-07-08 NOTE — Progress Notes (Signed)
Trauma Event Note  Trach  Last imported Vital Signs BP (!) 169/88   Pulse (!) 109   Temp 97.8 F (36.6 C) (Axillary)   Resp (!) 29   Ht 6' (1.829 m)   Wt 130.8 kg   SpO2 94%   BMI 39.11 kg/m   Trending CBC Recent Labs    07/06/23 0448 07/07/23 0414 07/07/23 1147 07/08/23 0431  WBC 7.4 6.2  --  7.4  HGB 7.3* 6.6* 8.6* 8.5*  HCT 23.5* 21.9* 27.9* 28.2*  PLT 184 155  --  179    Trending Coag's No results for input(s): "APTT", "INR" in the last 72 hours.  Trending BMET Recent Labs    07/06/23 0448 07/07/23 0621 07/08/23 0818  NA 136 138 137  K 3.7 4.2 3.9  CL 101 99 97*  CO2 28 29 30   BUN 27* 44* 47*  CREATININE 0.83 1.08 0.90  GLUCOSE 121* 118* 173*      Sussan Meter L Skip Litke  Trauma Response RN  Please call TRN at [phone] for further assistance.

## 2023-07-09 ENCOUNTER — Inpatient Hospital Stay (HOSPITAL_COMMUNITY): Payer: Medicare HMO

## 2023-07-09 LAB — GLUCOSE, CAPILLARY
Glucose-Capillary: 121 mg/dL — ABNORMAL HIGH (ref 70–99)
Glucose-Capillary: 144 mg/dL — ABNORMAL HIGH (ref 70–99)
Glucose-Capillary: 144 mg/dL — ABNORMAL HIGH (ref 70–99)
Glucose-Capillary: 142 mg/dL — ABNORMAL HIGH (ref 70–99)
Glucose-Capillary: 137 mg/dL — ABNORMAL HIGH (ref 70–99)

## 2023-07-09 LAB — CBC
HCT: 29.3 % — ABNORMAL LOW (ref 39.0–52.0)
Hemoglobin: 9.2 g/dL — ABNORMAL LOW (ref 13.0–17.0)
MCH: 27.5 pg (ref 26.0–34.0)
MCHC: 31.4 g/dL (ref 30.0–36.0)
MCV: 87.5 fL (ref 80.0–100.0)
Platelets: 185 10*3/uL (ref 150–400)
RBC: 3.35 MIL/uL — ABNORMAL LOW (ref 4.22–5.81)
RDW: 17.3 % — ABNORMAL HIGH (ref 11.5–15.5)
WBC: 7.6 10*3/uL (ref 4.0–10.5)
nRBC: 0 % (ref 0.0–0.2)

## 2023-07-09 LAB — BASIC METABOLIC PANEL
Anion gap: 14 (ref 5–15)
BUN: 44 mg/dL — ABNORMAL HIGH (ref 8–23)
CO2: 28 mmol/L (ref 22–32)
Calcium: 8.3 mg/dL — ABNORMAL LOW (ref 8.9–10.3)
Chloride: 94 mmol/L — ABNORMAL LOW (ref 98–111)
Creatinine, Ser: 1.05 mg/dL (ref 0.61–1.24)
GFR, Estimated: >60 mL/min (ref >60)
Glucose, Bld: 238 mg/dL — ABNORMAL HIGH (ref 70–99)
Potassium: 4.1 mmol/L (ref 3.5–5.1)
Sodium: 136 mmol/L (ref 135–145)

## 2023-07-09 MED ORDER — IOHEXOL 350 MG/ML SOLN
75.0000 mL | Freq: Once | INTRAVENOUS | Status: AC | PRN
  Administered 2023-07-09: 75 mL via INTRAVENOUS

## 2023-07-09 MED ORDER — IRBESARTAN 300 MG PO TABS
300.0000 mg | ORAL_TABLET | Freq: Every day | ORAL | Status: DC
  Administered 2023-07-09 – 2023-07-20 (×11): 300 mg
  Filled 2023-07-09: qty 2
  Filled 2023-07-09: qty 1
  Filled 2023-07-09: qty 2
  Filled 2023-07-09: qty 1
  Filled 2023-07-09: qty 2
  Filled 2023-07-09: qty 1
  Filled 2023-07-09: qty 2
  Filled 2023-07-09 (×4): qty 1
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
## 2023-07-09 NOTE — Progress Notes (Signed)
Pt's trach bleeding more than it had been since the beginning of the shift, TRN made aware and MD on call notified by TRN, MD to come and assess the trach bleed

## 2023-07-09 NOTE — Progress Notes (Signed)
Inpatient Rehab Admissions Coordinator:   Per therapy recommendations, patient was screened for CIR candidacy by Megan Salon, MS, CCC-SLP. At this time, Pt. is not yet tolerating OOB and not at a level to tolerate the intensity of CIR; however,  Pt. may have potential to progress to becoming a potential CIR candidate, so CIR admissions team will follow and monitor for progress and participation with therapies and place consult order if Pt. appears to be an appropriate candidate. Please contact me with any questions.   Megan Salon, MS, CCC-SLP Rehab Admissions Coordinator  [phone] (celll) [phone] (office)

## 2023-07-10 ENCOUNTER — Inpatient Hospital Stay (HOSPITAL_COMMUNITY): Payer: Medicare HMO

## 2023-07-10 LAB — BASIC METABOLIC PANEL
Anion gap: 8 (ref 5–15)
BUN: 58 mg/dL — ABNORMAL HIGH (ref 8–23)
CO2: 30 mmol/L (ref 22–32)
Calcium: 8.3 mg/dL — ABNORMAL LOW (ref 8.9–10.3)
Chloride: 100 mmol/L (ref 98–111)
Creatinine, Ser: 1.19 mg/dL (ref 0.61–1.24)
GFR, Estimated: >60 mL/min (ref >60)
Glucose, Bld: 140 mg/dL — ABNORMAL HIGH (ref 70–99)
Potassium: 4 mmol/L (ref 3.5–5.1)
Sodium: 138 mmol/L (ref 135–145)

## 2023-07-10 LAB — CBC
HCT: 27.5 % — ABNORMAL LOW (ref 39.0–52.0)
Hemoglobin: 8.2 g/dL — ABNORMAL LOW (ref 13.0–17.0)
MCH: 26.7 pg (ref 26.0–34.0)
MCHC: 29.8 g/dL — ABNORMAL LOW (ref 30.0–36.0)
MCV: 89.6 fL (ref 80.0–100.0)
Platelets: 175 10*3/uL (ref 150–400)
RBC: 3.07 MIL/uL — ABNORMAL LOW (ref 4.22–5.81)
RDW: 17.7 % — ABNORMAL HIGH (ref 11.5–15.5)
WBC: 10.1 10*3/uL (ref 4.0–10.5)
nRBC: 0 % (ref 0.0–0.2)

## 2023-07-10 LAB — GLUCOSE, CAPILLARY
Glucose-Capillary: 141 mg/dL — ABNORMAL HIGH (ref 70–99)
Glucose-Capillary: 129 mg/dL — ABNORMAL HIGH (ref 70–99)
Glucose-Capillary: 116 mg/dL — ABNORMAL HIGH (ref 70–99)
Glucose-Capillary: 123 mg/dL — ABNORMAL HIGH (ref 70–99)
Glucose-Capillary: 133 mg/dL — ABNORMAL HIGH (ref 70–99)
Glucose-Capillary: 125 mg/dL — ABNORMAL HIGH (ref 70–99)

## 2023-07-10 MED ORDER — ORAL CARE MOUTH RINSE
15.0000 mL | OROMUCOSAL | Status: DC
  Administered 2023-07-10 – 2023-07-22 (×46): 15 mL via OROMUCOSAL

## 2023-07-10 MED ORDER — CHLORHEXIDINE GLUCONATE CLOTH 2 % EX PADS
6.0000 | MEDICATED_PAD | Freq: Every day | CUTANEOUS | Status: DC
  Administered 2023-07-11 – 2023-07-20 (×10): 6 via TOPICAL

## 2023-07-10 NOTE — Progress Notes (Signed)
Called to bedside to evaluate trach bleeding. There is no visible bleeding at skin. I repacked surgicel for more pressure. Continue to hold lovenox

## 2023-07-10 NOTE — Progress Notes (Signed)
Per Dr. Cliffton Asters, continue to hold lovenox but ok to administer aspirin. Patient no longer bleeding, secretions are thick and tan in appearance. Care ongoing.
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
## 2023-07-11 LAB — CBC
HCT: 30.2 % — ABNORMAL LOW (ref 39.0–52.0)
Hemoglobin: 9.1 g/dL — ABNORMAL LOW (ref 13.0–17.0)
MCH: 26.5 pg (ref 26.0–34.0)
MCHC: 30.1 g/dL (ref 30.0–36.0)
MCV: 88 fL (ref 80.0–100.0)
Platelets: 179 10*3/uL (ref 150–400)
RBC: 3.43 MIL/uL — ABNORMAL LOW (ref 4.22–5.81)
RDW: 17.6 % — ABNORMAL HIGH (ref 11.5–15.5)
WBC: 7.8 10*3/uL (ref 4.0–10.5)
nRBC: 0 % (ref 0.0–0.2)

## 2023-07-11 LAB — COMPREHENSIVE METABOLIC PANEL
ALT: 39 U/L (ref 0–44)
AST: 32 U/L (ref 15–41)
Albumin: 2.4 g/dL — ABNORMAL LOW (ref 3.5–5.0)
Alkaline Phosphatase: 64 U/L (ref 38–126)
Anion gap: 10 (ref 5–15)
BUN: 60 mg/dL — ABNORMAL HIGH (ref 8–23)
CO2: 30 mmol/L (ref 22–32)
Calcium: 8.6 mg/dL — ABNORMAL LOW (ref 8.9–10.3)
Chloride: 101 mmol/L (ref 98–111)
Creatinine, Ser: 1.09 mg/dL (ref 0.61–1.24)
GFR, Estimated: >60 mL/min (ref >60)
Glucose, Bld: 157 mg/dL — ABNORMAL HIGH (ref 70–99)
Potassium: 3.9 mmol/L (ref 3.5–5.1)
Sodium: 141 mmol/L (ref 135–145)
Total Bilirubin: 0.6 mg/dL (ref 0.3–1.2)
Total Protein: 7.7 g/dL (ref 6.5–8.1)

## 2023-07-11 LAB — GLUCOSE, CAPILLARY
Glucose-Capillary: 117 mg/dL — ABNORMAL HIGH (ref 70–99)
Glucose-Capillary: 126 mg/dL — ABNORMAL HIGH (ref 70–99)
Glucose-Capillary: 128 mg/dL — ABNORMAL HIGH (ref 70–99)
Glucose-Capillary: 135 mg/dL — ABNORMAL HIGH (ref 70–99)
Glucose-Capillary: 136 mg/dL — ABNORMAL HIGH (ref 70–99)
Glucose-Capillary: 138 mg/dL — ABNORMAL HIGH (ref 70–99)

## 2023-07-11 LAB — PHOSPHORUS: Phosphorus: 3.4 mg/dL (ref 2.5–4.6)

## 2023-07-11 LAB — MAGNESIUM: Magnesium: 2.8 mg/dL — ABNORMAL HIGH (ref 1.7–2.4)

## 2023-07-11 MED ORDER — ENOXAPARIN SODIUM 30 MG/0.3ML IJ SOSY
30.0000 mg | PREFILLED_SYRINGE | Freq: Two times a day (BID) | INTRAMUSCULAR | Status: DC
  Administered 2023-07-11 – 2023-07-22 (×23): 30 mg via SUBCUTANEOUS
  Filled 2023-07-11 (×23): qty 0.3

## 2023-07-11 MED ORDER — QUETIAPINE FUMARATE 50 MG PO TABS
50.0000 mg | ORAL_TABLET | Freq: Two times a day (BID) | ORAL | Status: DC
  Administered 2023-07-11 – 2023-07-20 (×19): 50 mg
  Filled 2023-07-11 (×6): qty 1
  Filled 2023-07-11 (×2): qty 2
  Filled 2023-07-11 (×8): qty 1
  Filled 2023-07-11: qty 2
  Filled 2023-07-11 (×2): qty 1
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
## 2023-07-11 NOTE — Progress Notes (Signed)
  Progress Note    07/11/2023 7:10 AM 10 Days Post-Op    Vitals:   07/11/23 0530 07/11/23 0600  BP: (!) 161/82 (!) 149/69  Pulse: 81 81  Resp: (!) 23 (!) 21  Temp:    SpO2: 97% 98%    Physical Exam: General:  no distress Incisions:  right upper arm incision and LLE incision healed Extremities:  palpable right radial pulse.  Swelling RUE much improved from when I saw him a couple of weeks ago.   CBC    Component Value Date/Time   WBC 7.8 07/11/2023 0500   RBC 3.43 (L) 07/11/2023 0500   HGB 9.1 (L) 07/11/2023 0500   HCT 30.2 (L) 07/11/2023 0500   PLT 179 07/11/2023 0500   MCV 88.0 07/11/2023 0500   MCH 26.5 07/11/2023 0500   MCHC 30.1 07/11/2023 0500   RDW 17.6 (H) 07/11/2023 0500   LYMPHSABS 1.6 06/08/2023 1735   MONOABS 1.7 (H) 06/08/2023 1735   EOSABS 0.0 06/08/2023 1735   BASOSABS 0.0 06/08/2023 1735    BMET    Component Value Date/Time   NA 141 07/11/2023 0500   K 3.9 07/11/2023 0500   CL 101 07/11/2023 0500   CO2 30 07/11/2023 0500   GLUCOSE 157 (H) 07/11/2023 0500   BUN 60 (H) 07/11/2023 0500   CREATININE 1.09 07/11/2023 0500   CALCIUM 8.6 (L) 07/11/2023 0500   GFRNONAA >60 07/11/2023 0500    INR    Component Value Date/Time   INR 1.2 06/09/2023 1350     Intake/Output Summary (Last 24 hours) at 07/11/2023 0710 Last data filed at 07/11/2023 0600 Gross per 24 hour  Intake 1880.59 ml  Output 2251 ml  Net -370.41 ml      Assessment/Plan:  67 y.o. male is s/p:  Right brachial bypass with LLE GSV  10 Days Post-Op   -pt continues to have palpable right radial pulse -staples have been out and incision looks good.  LLE incision also healing nicely -continue daily asa -will not follow actively-please call with questions.    Doreatha Massed, PA-C [facility] [phone] 07/11/2023 7:10 AM
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
## 2023-07-12 LAB — BASIC METABOLIC PANEL
Anion gap: 7 (ref 5–15)
BUN: 57 mg/dL — ABNORMAL HIGH (ref 8–23)
CO2: 33 mmol/L — ABNORMAL HIGH (ref 22–32)
Calcium: 8.8 mg/dL — ABNORMAL LOW (ref 8.9–10.3)
Chloride: 100 mmol/L (ref 98–111)
Creatinine, Ser: 0.99 mg/dL (ref 0.61–1.24)
GFR, Estimated: >60 mL/min (ref >60)
Glucose, Bld: 130 mg/dL — ABNORMAL HIGH (ref 70–99)
Potassium: 4.1 mmol/L (ref 3.5–5.1)
Sodium: 140 mmol/L (ref 135–145)

## 2023-07-12 LAB — GLUCOSE, CAPILLARY
Glucose-Capillary: 134 mg/dL — ABNORMAL HIGH (ref 70–99)
Glucose-Capillary: 150 mg/dL — ABNORMAL HIGH (ref 70–99)
Glucose-Capillary: 137 mg/dL — ABNORMAL HIGH (ref 70–99)
Glucose-Capillary: 138 mg/dL — ABNORMAL HIGH (ref 70–99)
Glucose-Capillary: 149 mg/dL — ABNORMAL HIGH (ref 70–99)

## 2023-07-12 LAB — CBC
HCT: 30.8 % — ABNORMAL LOW (ref 39.0–52.0)
Hemoglobin: 9.1 g/dL — ABNORMAL LOW (ref 13.0–17.0)
MCH: 25.9 pg — ABNORMAL LOW (ref 26.0–34.0)
MCHC: 29.5 g/dL — ABNORMAL LOW (ref 30.0–36.0)
MCV: 87.7 fL (ref 80.0–100.0)
Platelets: 198 10*3/uL (ref 150–400)
RBC: 3.51 MIL/uL — ABNORMAL LOW (ref 4.22–5.81)
RDW: 17.5 % — ABNORMAL HIGH (ref 11.5–15.5)
WBC: 11 10*3/uL — ABNORMAL HIGH (ref 4.0–10.5)
nRBC: 0 % (ref 0.0–0.2)

## 2023-07-12 MED FILL — Medication: Qty: 1 | Status: AC
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
## 2023-07-13 ENCOUNTER — Inpatient Hospital Stay (HOSPITAL_COMMUNITY): Payer: Medicare HMO

## 2023-07-13 LAB — BASIC METABOLIC PANEL
Anion gap: 7 (ref 5–15)
BUN: 55 mg/dL — ABNORMAL HIGH (ref 8–23)
CO2: 31 mmol/L (ref 22–32)
Calcium: 9 mg/dL (ref 8.9–10.3)
Chloride: 103 mmol/L (ref 98–111)
Creatinine, Ser: 1.01 mg/dL (ref 0.61–1.24)
GFR, Estimated: >60 mL/min (ref >60)
Glucose, Bld: 155 mg/dL — ABNORMAL HIGH (ref 70–99)
Potassium: 4.2 mmol/L (ref 3.5–5.1)
Sodium: 141 mmol/L (ref 135–145)

## 2023-07-13 LAB — GLUCOSE, CAPILLARY
Glucose-Capillary: 143 mg/dL — ABNORMAL HIGH (ref 70–99)
Glucose-Capillary: 127 mg/dL — ABNORMAL HIGH (ref 70–99)
Glucose-Capillary: 120 mg/dL — ABNORMAL HIGH (ref 70–99)
Glucose-Capillary: 117 mg/dL — ABNORMAL HIGH (ref 70–99)

## 2023-07-13 LAB — CBC
HCT: 32.7 % — ABNORMAL LOW (ref 39.0–52.0)
Hemoglobin: 10 g/dL — ABNORMAL LOW (ref 13.0–17.0)
MCH: 26.7 pg (ref 26.0–34.0)
MCHC: 30.6 g/dL (ref 30.0–36.0)
MCV: 87.4 fL (ref 80.0–100.0)
Platelets: 225 10*3/uL (ref 150–400)
RBC: 3.74 MIL/uL — ABNORMAL LOW (ref 4.22–5.81)
RDW: 17.2 % — ABNORMAL HIGH (ref 11.5–15.5)
WBC: 14.4 10*3/uL — ABNORMAL HIGH (ref 4.0–10.5)
nRBC: 0 % (ref 0.0–0.2)

## 2023-07-13 MED ORDER — METOCLOPRAMIDE HCL 5 MG/ML IJ SOLN
10.0000 mg | Freq: Four times a day (QID) | INTRAMUSCULAR | Status: DC
  Administered 2023-07-13 – 2023-07-22 (×36): 10 mg via INTRAVENOUS
  Filled 2023-07-13 (×38): qty 2

## 2023-07-13 MED ORDER — SODIUM CHLORIDE 0.9 % IV SOLN
2.0000 g | Freq: Three times a day (TID) | INTRAVENOUS | Status: DC
  Administered 2023-07-13 – 2023-07-17 (×12): 2 g via INTRAVENOUS
  Filled 2023-07-13 (×12): qty 12.5
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
## 2023-07-13 NOTE — Progress Notes (Signed)
Pt coughing and congested, suctioned trach, pt started to have projectile emesis of tan-green color, called for additional support, pt has NGT, question position. MD made aware, Stopped tube feeding immediately and suctioned pt mouth. pt vomited large amount tan drainage 400 cc. In addition, pt received hydrazaline for BP 170/100 prior to vomiting episode. Pt alert and mouthing words.

## 2023-07-13 NOTE — Progress Notes (Signed)
SLP Cancellation Note  Patient Details Name: Russell Garcia MRN: [id number] DOB: September 15, 1956   Cancelled treatment:       Reason Eval/Treat Not Completed: Fatigue/lethargy limiting ability to participate;Medical issues which prohibited therapy. Pt vomited this am, TF on hold, has an ileus per RN. Will defer FEES at this time.    Audryanna Zurita, Riley Nearing 07/13/2023, 1:38 PM

## 2023-07-13 NOTE — Progress Notes (Signed)
Brief Nutrition Support Note  Pt last seen by RD for Nutrition Follow-up on 9/16. Pivot 1.5 tube feeds infusing at goal rate of 60 ml/hr via post-pyloric Cortrak at that time.  Per nursing notes, pt experienced projectile emesis overnight after trach was suctioned. Tube feeds were stopped immediately. Pt experienced a second episode of tan emesis that was at least 400 ml per notes.  An abdominal x-ray was obtained overnight showing Cortrak tube terminating at the level of the ligament of Treitz. Abdominal x-ray also noted increased widespread small bowel and large bowel gas, more compatible with ileus than mechanical bowel obstruction at this time.  Pt with an NG tube in place to LIWS. Location of NG tube not mentioned in abdominal x-ray from earlier this morning. A chest x-ray has been obtained which shows gastric tube to the distal esophagus. Suspect this will need to be advanced to stomach for effective LIWS; reached out to Trauma PA  Plan per Trauma is to hold tube feeding today and resume IV reglan.  RD will monitor for ability to resume tube feeds via post-pyloric Cortrak tube: - Pivot 1.5 with goal rate of 60 ml/hr (1440 ml/day) - PROSource TF20 60 ml BID  Recommended tube feeding regimen at goal rate would provide 2320 kcal, 175 grams of protein, and 1094 ml of H2O.  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   Mertie Clause, MS, RD, LDN Registered Dietitian II Please see AMiON for contact information.

## 2023-07-13 NOTE — Progress Notes (Signed)
Upon arrival to room to receive report, pt hr 140s, o2 85% on trach collar at 35%fi02, pt agitated. Pt deep suctioned via trach, PRN lopressor given, PRN zofran given. Dr. Janee Morn and Marchelle Folks Trauma RN notified and coming to assess pt. RT notified and coming up to assess pt. RT deep suctioned via trach, fi02 increased to 100%, after interventions, pt HR now 110, 02 96%. Pt no longer agitated. Dr. Janee Morn and Marchelle Folks Trauma RN came by to see pt, no other interventions at this time.
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
## 2023-07-13 NOTE — Progress Notes (Signed)
Tube feeding currenlty on HOLD until further notice, pt xray result ILLEUS, per MD conversation. SRP, RN
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
## 2023-07-13 NOTE — Plan of Care (Signed)
Problem: Health Behavior/Discharge Planning: Goal: Ability to manage health-related needs will improve Outcome: Progressing   Problem: Clinical Measurements: Goal: Ability to maintain clinical measurements within normal limits will improve Outcome: Progressing Goal: Will remain free from infection Outcome: Progressing Goal: Diagnostic test results will improve Outcome: Progressing Goal: Respiratory complications will improve Outcome: Progressing Goal: Cardiovascular complication will be avoided Outcome: Progressing   Problem: Activity: Goal: Risk for activity intolerance will decrease Outcome: Progressing   Problem: Nutrition: Goal: Adequate nutrition will be maintained Outcome: Progressing   Problem: Coping: Goal: Level of anxiety will decrease Outcome: Progressing   Problem: Elimination: Goal: Will not experience complications related to bowel motility Outcome: Progressing Goal: Will not experience complications related to urinary retention Outcome: Progressing   Problem: Pain Managment: Goal: General experience of comfort will improve Outcome: Progressing   Problem: Safety: Goal: Ability to remain free from injury will improve Outcome: Progressing   Problem: Skin Integrity: Goal: Risk for impaired skin integrity will decrease Outcome: Progressing   Problem: Education: Goal: Knowledge about tracheostomy care/management will improve Outcome: Progressing   Problem: Activity: Goal: Ability to tolerate increased activity will improve Outcome: Progressing   Problem: Health Behavior/Discharge Planning: Goal: Ability to manage tracheostomy will improve Outcome: Progressing   Problem: Respiratory: Goal: Patent airway maintenance will improve Outcome: Progressing   Problem: Role Relationship: Goal: Ability to communicate will improve Outcome: Progressing

## 2023-07-13 NOTE — Plan of Care (Signed)
Problem: Health Behavior/Discharge Planning: Goal: Ability to manage health-related needs will improve Outcome: Progressing   Problem: Clinical Measurements: Goal: Will remain free from infection Outcome: Progressing Goal: Cardiovascular complication will be avoided Outcome: Progressing

## 2023-07-14 LAB — BASIC METABOLIC PANEL
Anion gap: 8 (ref 5–15)
BUN: 66 mg/dL — ABNORMAL HIGH (ref 8–23)
CO2: 31 mmol/L (ref 22–32)
Calcium: 9 mg/dL (ref 8.9–10.3)
Chloride: 104 mmol/L (ref 98–111)
Creatinine, Ser: 1.52 mg/dL — ABNORMAL HIGH (ref 0.61–1.24)
GFR, Estimated: 50 mL/min — ABNORMAL LOW (ref >60)
Glucose, Bld: 117 mg/dL — ABNORMAL HIGH (ref 70–99)
Potassium: 4.5 mmol/L (ref 3.5–5.1)
Sodium: 143 mmol/L (ref 135–145)

## 2023-07-14 LAB — CBC
HCT: 32.2 % — ABNORMAL LOW (ref 39.0–52.0)
HCT: 30.1 % — ABNORMAL LOW (ref 39.0–52.0)
Hemoglobin: 9.5 g/dL — ABNORMAL LOW (ref 13.0–17.0)
Hemoglobin: 9.1 g/dL — ABNORMAL LOW (ref 13.0–17.0)
MCH: 25.7 pg — ABNORMAL LOW (ref 26.0–34.0)
MCH: 26.2 pg (ref 26.0–34.0)
MCHC: 29.5 g/dL — ABNORMAL LOW (ref 30.0–36.0)
MCHC: 30.2 g/dL (ref 30.0–36.0)
MCV: 87 fL (ref 80.0–100.0)
MCV: 86.7 fL (ref 80.0–100.0)
Platelets: 198 10*3/uL (ref 150–400)
Platelets: 184 10*3/uL (ref 150–400)
RBC: 3.7 MIL/uL — ABNORMAL LOW (ref 4.22–5.81)
RBC: 3.47 MIL/uL — ABNORMAL LOW (ref 4.22–5.81)
RDW: 17.8 % — ABNORMAL HIGH (ref 11.5–15.5)
RDW: 17.7 % — ABNORMAL HIGH (ref 11.5–15.5)
WBC: 11.8 10*3/uL — ABNORMAL HIGH (ref 4.0–10.5)
WBC: 9.7 10*3/uL (ref 4.0–10.5)
nRBC: 0 % (ref 0.0–0.2)
nRBC: 0 % (ref 0.0–0.2)

## 2023-07-14 LAB — GLUCOSE, CAPILLARY
Glucose-Capillary: 103 mg/dL — ABNORMAL HIGH (ref 70–99)
Glucose-Capillary: 105 mg/dL — ABNORMAL HIGH (ref 70–99)
Glucose-Capillary: 106 mg/dL — ABNORMAL HIGH (ref 70–99)
Glucose-Capillary: 107 mg/dL — ABNORMAL HIGH (ref 70–99)
Glucose-Capillary: 109 mg/dL — ABNORMAL HIGH (ref 70–99)
Glucose-Capillary: 118 mg/dL — ABNORMAL HIGH (ref 70–99)
Glucose-Capillary: 95 mg/dL (ref 70–99)

## 2023-07-14 LAB — COMPREHENSIVE METABOLIC PANEL
ALT: 31 U/L (ref 0–44)
AST: 31 U/L (ref 15–41)
Albumin: 2.5 g/dL — ABNORMAL LOW (ref 3.5–5.0)
Alkaline Phosphatase: 61 U/L (ref 38–126)
Anion gap: 12 (ref 5–15)
BUN: 63 mg/dL — ABNORMAL HIGH (ref 8–23)
CO2: 26 mmol/L (ref 22–32)
Calcium: 8.7 mg/dL — ABNORMAL LOW (ref 8.9–10.3)
Chloride: 108 mmol/L (ref 98–111)
Creatinine, Ser: 1.26 mg/dL — ABNORMAL HIGH (ref 0.61–1.24)
GFR, Estimated: >60 mL/min (ref >60)
Glucose, Bld: 114 mg/dL — ABNORMAL HIGH (ref 70–99)
Potassium: 5.6 mmol/L — ABNORMAL HIGH (ref 3.5–5.1)
Sodium: 146 mmol/L — ABNORMAL HIGH (ref 135–145)
Total Bilirubin: 0.9 mg/dL (ref 0.3–1.2)
Total Protein: 7.7 g/dL (ref 6.5–8.1)

## 2023-07-14 LAB — TROPONIN I (HIGH SENSITIVITY): Troponin I (High Sensitivity): 11 ng/L (ref <18)

## 2023-07-14 LAB — MAGNESIUM: Magnesium: 3 mg/dL — ABNORMAL HIGH (ref 1.7–2.4)

## 2023-07-14 LAB — PHOSPHORUS: Phosphorus: 4.1 mg/dL (ref 2.5–4.6)

## 2023-07-14 MED ORDER — METOPROLOL TARTRATE 25 MG/10 ML ORAL SUSPENSION
37.5000 mg | Freq: Four times a day (QID) | ORAL | Status: DC
  Administered 2023-07-14 – 2023-07-21 (×28): 37.5 mg
  Filled 2023-07-14 (×30): qty 15

## 2023-07-14 MED ORDER — PIVOT 1.5 CAL PO LIQD
1000.0000 mL | ORAL | Status: DC
  Administered 2023-07-14: 1000 mL
  Filled 2023-07-14: qty 1000

## 2023-07-14 MED ORDER — SODIUM CHLORIDE 0.9 % IV BOLUS
500.0000 mL | Freq: Once | INTRAVENOUS | Status: AC
  Administered 2023-07-14: 500 mL via INTRAVENOUS

## 2023-07-14 NOTE — Progress Notes (Addendum)
Inpatient Rehab Admissions Coordinator:   Note plans for [facility]. Please reach out if plans change.   Estill Dooms, PT, DPT Admissions Coordinator [phone] 07/14/23  11:21 AM

## 2023-07-14 NOTE — Plan of Care (Signed)
  Problem: Health Behavior/Discharge Planning: Goal: Ability to manage health-related needs will improve Outcome: Progressing   Problem: Clinical Measurements: Goal: Will remain free from infection Outcome: Progressing Goal: Diagnostic test results will improve Outcome: Progressing Goal: Respiratory complications will improve Outcome: Progressing Goal: Cardiovascular complication will be avoided Outcome: Progressing   Problem: Elimination: Goal: Will not experience complications related to urinary retention Outcome: Progressing   Problem: Pain Managment: Goal: General experience of comfort will improve Outcome: Progressing   Problem: Safety: Goal: Ability to remain free from injury will improve Outcome: Progressing   Problem: Clinical Measurements: Goal: Ability to maintain clinical measurements within normal limits will improve Outcome: Not Progressing   Problem: Activity: Goal: Risk for activity intolerance will decrease Outcome: Not Progressing   Problem: Nutrition: Goal: Adequate nutrition will be maintained Outcome: Not Progressing   Problem: Coping: Goal: Level of anxiety will decrease Outcome: Not Progressing   Problem: Elimination: Goal: Will not experience complications related to bowel motility Outcome: Not Progressing
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
## 2023-07-14 NOTE — Plan of Care (Signed)
Problem: Health Behavior/Discharge Planning: Goal: Ability to manage health-related needs will improve Outcome: Not Progressing   Problem: Clinical Measurements: Goal: Ability to maintain clinical measurements within normal limits will improve Outcome: Not Progressing Goal: Will remain free from infection Outcome: Not Progressing Goal: Diagnostic test results will improve Outcome: Not Progressing Goal: Respiratory complications will improve Outcome: Not Progressing Goal: Cardiovascular complication will be avoided Outcome: Not Progressing   Problem: Activity: Goal: Risk for activity intolerance will decrease Outcome: Not Progressing   Problem: Nutrition: Goal: Adequate nutrition will be maintained Outcome: Not Progressing   Problem: Coping: Goal: Level of anxiety will decrease Outcome: Not Progressing   Problem: Elimination: Goal: Will not experience complications related to bowel motility Outcome: Not Progressing Goal: Will not experience complications related to urinary retention Outcome: Not Progressing   Problem: Pain Managment: Goal: General experience of comfort will improve Outcome: Not Progressing   Problem: Safety: Goal: Ability to remain free from injury will improve Outcome: Not Progressing   Problem: Skin Integrity: Goal: Risk for impaired skin integrity will decrease Outcome: Not Progressing   Problem: Education: Goal: Knowledge about tracheostomy care/management will improve Outcome: Not Progressing   Problem: Activity: Goal: Ability to tolerate increased activity will improve Outcome: Not Progressing   Problem: Health Behavior/Discharge Planning: Goal: Ability to manage tracheostomy will improve Outcome: Not Progressing   Problem: Respiratory: Goal: Patent airway maintenance will improve Outcome: Not Progressing   Problem: Role Relationship: Goal: Ability to communicate will improve Outcome: Not Progressing

## 2023-07-14 NOTE — Progress Notes (Signed)
I was notified by RN that patient has been increasingly hypotensive over the last few hours, and SBP is now 69 with patient awake. He has not received any oral antihypertensives during the day due to concern for aspiration event yesterday morning. Crystalloid bolus started with improvement in BP to 120/60. Patient remains alert, denies dizziness. Abdomen is soft and nondistended. EKG is unremarkable with sinus rhythm, no ST changes. STAT labs pending. On antibiotics for empiric treatment of aspiration pneumonia. Will continue to monitor BP closely, transfer to ICU if recurrent hypotension. All antihypertensives have been discontinued.  Sophronia Simas, MD [facility] Surgery General, Hepatobiliary and Pancreatic Surgery 07/14/23 12:41 AM

## 2023-07-15 LAB — GLUCOSE, CAPILLARY
Glucose-Capillary: 101 mg/dL — ABNORMAL HIGH (ref 70–99)
Glucose-Capillary: 124 mg/dL — ABNORMAL HIGH (ref 70–99)
Glucose-Capillary: 134 mg/dL — ABNORMAL HIGH (ref 70–99)
Glucose-Capillary: 148 mg/dL — ABNORMAL HIGH (ref 70–99)
Glucose-Capillary: 133 mg/dL — ABNORMAL HIGH (ref 70–99)
Glucose-Capillary: 141 mg/dL — ABNORMAL HIGH (ref 70–99)

## 2023-07-15 LAB — CULTURE, RESPIRATORY W GRAM STAIN

## 2023-07-15 LAB — BASIC METABOLIC PANEL
Anion gap: 10 (ref 5–15)
BUN: 49 mg/dL — ABNORMAL HIGH (ref 8–23)
CO2: 26 mmol/L (ref 22–32)
Calcium: 8.7 mg/dL — ABNORMAL LOW (ref 8.9–10.3)
Chloride: 110 mmol/L (ref 98–111)
Creatinine, Ser: 1.07 mg/dL (ref 0.61–1.24)
GFR, Estimated: >60 mL/min (ref >60)
Glucose, Bld: 129 mg/dL — ABNORMAL HIGH (ref 70–99)
Potassium: 4.1 mmol/L (ref 3.5–5.1)
Sodium: 146 mmol/L — ABNORMAL HIGH (ref 135–145)

## 2023-07-15 MED ORDER — HYDROCHLOROTHIAZIDE 25 MG PO TABS
25.0000 mg | ORAL_TABLET | Freq: Every day | ORAL | Status: DC
  Administered 2023-07-15 – 2023-07-20 (×6): 25 mg
  Filled 2023-07-15 (×6): qty 1

## 2023-07-15 MED ORDER — LISINOPRIL 20 MG PO TABS: 20.0000 mg | ORAL_TABLET | Freq: Every day | ORAL | Status: DC

## 2023-07-15 MED ORDER — PIVOT 1.5 CAL PO LIQD
1000.0000 mL | ORAL | Status: DC
  Administered 2023-07-15 – 2023-07-16 (×2): 1000 mL

## 2023-07-15 MED ORDER — POLYETHYLENE GLYCOL 3350 17 G PO PACK
17.0000 g | PACK | Freq: Every day | ORAL | Status: DC
  Administered 2023-07-16 – 2023-07-20 (×2): 17 g
  Filled 2023-07-15 (×4): qty 1

## 2023-07-15 MED ORDER — PIVOT 1.5 CAL PO LIQD
1000.0000 mL | ORAL | Status: DC
  Administered 2023-07-15: 1000 mL

## 2023-07-15 MED ORDER — GUAIFENESIN 100 MG/5ML PO LIQD
15.0000 mL | ORAL | Status: DC
  Administered 2023-07-15 – 2023-07-21 (×32): 15 mL
  Filled 2023-07-15 (×32): qty 20
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
## 2023-07-15 NOTE — Plan of Care (Signed)
Problem: Health Behavior/Discharge Planning: Goal: Ability to manage health-related needs will improve Outcome: Progressing   Problem: Clinical Measurements: Goal: Ability to maintain clinical measurements within normal limits will improve Outcome: Progressing Goal: Will remain free from infection Outcome: Progressing Goal: Diagnostic test results will improve Outcome: Progressing Goal: Respiratory complications will improve Outcome: Progressing Goal: Cardiovascular complication will be avoided Outcome: Progressing   Problem: Activity: Goal: Risk for activity intolerance will decrease Outcome: Progressing   Problem: Nutrition: Goal: Adequate nutrition will be maintained Outcome: Progressing   Problem: Coping: Goal: Level of anxiety will decrease Outcome: Progressing   Problem: Elimination: Goal: Will not experience complications related to bowel motility Outcome: Progressing Goal: Will not experience complications related to urinary retention Outcome: Progressing   Problem: Pain Managment: Goal: General experience of comfort will improve Outcome: Progressing   Problem: Safety: Goal: Ability to remain free from injury will improve Outcome: Progressing   Problem: Skin Integrity: Goal: Risk for impaired skin integrity will decrease Outcome: Progressing   Problem: Education: Goal: Knowledge about tracheostomy care/management will improve Outcome: Progressing   Problem: Activity: Goal: Ability to tolerate increased activity will improve Outcome: Progressing   Problem: Health Behavior/Discharge Planning: Goal: Ability to manage tracheostomy will improve Outcome: Progressing   Problem: Respiratory: Goal: Patent airway maintenance will improve Outcome: Progressing   Problem: Role Relationship: Goal: Ability to communicate will improve Outcome: Progressing
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
## 2023-07-15 NOTE — TOC Progression Note (Addendum)
Transition of Care [facility]) - Progression Note    Patient Details  Name: Russell Garcia MRN: [id number] Date of Birth: [birth year]  [facility] CM/SW Contact  Tom-Johnson, Hershal Coria, RN Phone Number: 07/15/2023, 1:52 PM  Clinical Narrative:     CM called patient's son, [name] to talk about discharge disposition and left a secure message to return call.  CM will continue to follow.  14:25- Received a return call from patient's son, [name] and he chose [facility]. CM notified Latoya at [facility].  CM will continue to follow as patient progresses with care towards discharge.            Expected Discharge Plan: Long Term Acute Care (LTAC) Barriers to Discharge: Continued Medical Work up  Expected Discharge Plan and Services   Discharge Planning Services: CM Consult Post Acute Care Choice: Long Term Acute Care (LTAC) Living arrangements for the past 2 months: Single Family Home                                       Social Determinants of Health (SDOH) Interventions    Readmission Risk Interventions     No data to display

## 2023-07-15 NOTE — Progress Notes (Addendum)
Tube feeding increased to 28ml/hr at 1300 and then to 48ml/hr at 1700. Pt tolerating well. Denies nausea, no vomiting. Pt has been drinking thickened apple juice and eating dysphagia level 2 diet since his FEES test today. Pt has been transferring from bed to chair with Steady, improvement from yesterday.

## 2023-07-15 NOTE — Plan of Care (Signed)
  Problem: Health Behavior/Discharge Planning: Goal: Ability to manage health-related needs will improve Outcome: Progressing   

## 2023-07-15 NOTE — Progress Notes (Signed)
Brief Nutrition Support Note  Pt tolerating Pivot 1.5 tube feeds at 20 ml/hr via post-pyloric Cortrak without nausea, vomiting, or worsening abdominal distention. Pt is having loose bowel movements.  Plan per Trauma is to advance Pivot 1.5 tube feeds to 30 ml/hr then continue to advance rate by 10 ml every 4 hours to goal rate of 60 ml/hr. Discussed plan with RN.  Also noted that pt is undergoing FEES today.  Continue tube feeds via post-pyloric Cortrak tube: - Increase Pivot 1.5 to 30 ml/hr. Continue to advance rate by 10 ml every 4 hours to goal rate of 60 ml/hr (1440 ml/day). - PROSource TF20 60 ml BID  Tube feeding regimen at goal rate provides 2320 kcal, 175 grams of protein, and 1080 ml of H2O.  If pt demonstrates any further intolerance to enteral nutrition, recommend initiation of TPN due to high nutritional risk and multiple interruptions in enteral nutrition during pt's prolonged hospital admission.  RD will continue to follow pt during admission.   Mertie Clause, MS, RD, LDN Registered Dietitian II Please see AMiON for contact information.
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
## 2023-07-16 LAB — GLUCOSE, CAPILLARY
Glucose-Capillary: 126 mg/dL — ABNORMAL HIGH (ref 70–99)
Glucose-Capillary: 132 mg/dL — ABNORMAL HIGH (ref 70–99)
Glucose-Capillary: 133 mg/dL — ABNORMAL HIGH (ref 70–99)
Glucose-Capillary: 147 mg/dL — ABNORMAL HIGH (ref 70–99)
Glucose-Capillary: 151 mg/dL — ABNORMAL HIGH (ref 70–99)
Glucose-Capillary: 160 mg/dL — ABNORMAL HIGH (ref 70–99)

## 2023-07-16 MED ORDER — PIVOT 1.5 CAL PO LIQD
1000.0000 mL | ORAL | Status: DC
  Administered 2023-07-16 (×2): 1000 mL

## 2023-07-16 NOTE — Plan of Care (Signed)
  Problem: Nutrition: Goal: Adequate nutrition will be maintained Outcome: Progressing   Problem: Education: Goal: Knowledge about tracheostomy care/management will improve Outcome: Progressing   Problem: Role Relationship: Goal: Ability to communicate will improve Outcome: Progressing   Problem: Health Behavior/Discharge Planning: Goal: Ability to manage health-related needs will improve Outcome: Not Progressing   Problem: Activity: Goal: Risk for activity intolerance will decrease Outcome: Not Progressing   Problem: Coping: Goal: Level of anxiety will decrease Outcome: Not Progressing   Problem: Elimination: Goal: Will not experience complications related to bowel motility Outcome: Not Progressing   Problem: Activity: Goal: Ability to tolerate increased activity will improve Outcome: Not Progressing

## 2023-07-16 NOTE — Plan of Care (Signed)
Problem: Health Behavior/Discharge Planning: Goal: Ability to manage health-related needs will improve Outcome: Progressing
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
## 2023-07-17 LAB — GLUCOSE, CAPILLARY
Glucose-Capillary: 116 mg/dL — ABNORMAL HIGH (ref 70–99)
Glucose-Capillary: 128 mg/dL — ABNORMAL HIGH (ref 70–99)
Glucose-Capillary: 109 mg/dL — ABNORMAL HIGH (ref 70–99)
Glucose-Capillary: 131 mg/dL — ABNORMAL HIGH (ref 70–99)
Glucose-Capillary: 96 mg/dL (ref 70–99)
Glucose-Capillary: 131 mg/dL — ABNORMAL HIGH (ref 70–99)

## 2023-07-17 LAB — BASIC METABOLIC PANEL
Anion gap: 11 (ref 5–15)
BUN: 36 mg/dL — ABNORMAL HIGH (ref 8–23)
CO2: 24 mmol/L (ref 22–32)
Calcium: 8.9 mg/dL (ref 8.9–10.3)
Chloride: 103 mmol/L (ref 98–111)
Creatinine, Ser: 0.97 mg/dL (ref 0.61–1.24)
GFR, Estimated: >60 mL/min (ref >60)
Glucose, Bld: 105 mg/dL — ABNORMAL HIGH (ref 70–99)
Potassium: 4 mmol/L (ref 3.5–5.1)
Sodium: 138 mmol/L (ref 135–145)

## 2023-07-17 MED ORDER — PSYLLIUM 95 % PO PACK
1.0000 | PACK | Freq: Every day | ORAL | Status: DC
  Administered 2023-07-17 – 2023-07-21 (×5): 1 via ORAL
  Filled 2023-07-17 (×6): qty 1

## 2023-07-17 MED ORDER — CEPHALEXIN 500 MG PO CAPS
500.0000 mg | ORAL_CAPSULE | Freq: Three times a day (TID) | ORAL | Status: AC
  Administered 2023-07-17 – 2023-07-18 (×5): 500 mg
  Filled 2023-07-17 (×5): qty 1

## 2023-07-17 MED ORDER — ENSURE ENLIVE PO LIQD
237.0000 mL | Freq: Two times a day (BID) | ORAL | Status: DC
  Administered 2023-07-17 – 2023-07-19 (×3): 237 mL via ORAL
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
## 2023-07-18 ENCOUNTER — Inpatient Hospital Stay (HOSPITAL_COMMUNITY): Payer: Medicare HMO

## 2023-07-18 LAB — GLUCOSE, CAPILLARY
Glucose-Capillary: 94 mg/dL (ref 70–99)
Glucose-Capillary: 123 mg/dL — ABNORMAL HIGH (ref 70–99)
Glucose-Capillary: 95 mg/dL (ref 70–99)
Glucose-Capillary: 119 mg/dL — ABNORMAL HIGH (ref 70–99)
Glucose-Capillary: 97 mg/dL (ref 70–99)

## 2023-07-18 LAB — COMPREHENSIVE METABOLIC PANEL
ALT: 25 U/L (ref 0–44)
AST: 20 U/L (ref 15–41)
Albumin: 2.3 g/dL — ABNORMAL LOW (ref 3.5–5.0)
Alkaline Phosphatase: 61 U/L (ref 38–126)
Anion gap: 9 (ref 5–15)
BUN: 40 mg/dL — ABNORMAL HIGH (ref 8–23)
CO2: 25 mmol/L (ref 22–32)
Calcium: 8.6 mg/dL — ABNORMAL LOW (ref 8.9–10.3)
Chloride: 103 mmol/L (ref 98–111)
Creatinine, Ser: 0.95 mg/dL (ref 0.61–1.24)
GFR, Estimated: >60 mL/min (ref >60)
Glucose, Bld: 120 mg/dL — ABNORMAL HIGH (ref 70–99)
Potassium: 3.8 mmol/L (ref 3.5–5.1)
Sodium: 137 mmol/L (ref 135–145)
Total Bilirubin: 0.5 mg/dL (ref 0.3–1.2)
Total Protein: 7 g/dL (ref 6.5–8.1)

## 2023-07-18 LAB — MAGNESIUM: Magnesium: 2.5 mg/dL — ABNORMAL HIGH (ref 1.7–2.4)

## 2023-07-18 LAB — PHOSPHORUS: Phosphorus: 2.9 mg/dL (ref 2.5–4.6)

## 2023-07-18 NOTE — Plan of Care (Signed)
Problem: Health Behavior/Discharge Planning: Goal: Ability to manage health-related needs will improve Outcome: Progressing   Problem: Clinical Measurements: Goal: Ability to maintain clinical measurements within normal limits will improve Outcome: Progressing Goal: Will remain free from infection Outcome: Progressing Goal: Diagnostic test results will improve Outcome: Progressing Goal: Respiratory complications will improve Outcome: Progressing Goal: Cardiovascular complication will be avoided Outcome: Progressing   Problem: Activity: Goal: Risk for activity intolerance will decrease Outcome: Progressing   Problem: Nutrition: Goal: Adequate nutrition will be maintained Outcome: Progressing   Problem: Coping: Goal: Level of anxiety will decrease Outcome: Progressing   Problem: Elimination: Goal: Will not experience complications related to bowel motility Outcome: Progressing Goal: Will not experience complications related to urinary retention Outcome: Progressing   Problem: Pain Managment: Goal: General experience of comfort will improve Outcome: Progressing   Problem: Safety: Goal: Ability to remain free from injury will improve Outcome: Progressing   Problem: Skin Integrity: Goal: Risk for impaired skin integrity will decrease Outcome: Progressing   Problem: Education: Goal: Knowledge about tracheostomy care/management will improve Outcome: Progressing   Problem: Activity: Goal: Ability to tolerate increased activity will improve Outcome: Progressing   Problem: Health Behavior/Discharge Planning: Goal: Ability to manage tracheostomy will improve Outcome: Progressing   Problem: Respiratory: Goal: Patent airway maintenance will improve Outcome: Progressing   Problem: Role Relationship: Goal: Ability to communicate will improve Outcome: Progressing
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
## 2023-07-18 NOTE — TOC Progression Note (Signed)
Transition of Care [facility]) - Progression Note    Patient Details  Name: Russell Garcia MRN: [id number] Date of Birth: Feb 06, 1956  Transition of Care [facility]) CM/SW Contact  Glennon Mac, RN Phone Number: 07/18/2023, 4:10 PM  Clinical Narrative:    Insurance authorization pending for [employer] admission.  Will provide updates as available.   Expected Discharge Plan: Long Term Acute Care (LTAC) Barriers to Discharge: Continued Medical Work up  Expected Discharge Plan and Services   Discharge Planning Services: CM Consult Post Acute Care Choice: Long Term Acute Care (LTAC) Living arrangements for the past 2 months: Single Family Home                                       Social Determinants of Health (SDOH) Interventions    Readmission Risk Interventions     No data to display         Quintella Baton, RN, BSN  Trauma/Neuro ICU Case Manager [phone]

## 2023-07-18 NOTE — Plan of Care (Signed)
  Problem: Health Behavior/Discharge Planning: Goal: Ability to manage health-related needs will improve Outcome: Progressing   Problem: Clinical Measurements: Goal: Ability to maintain clinical measurements within normal limits will improve Outcome: Progressing Goal: Will remain free from infection Outcome: Progressing Goal: Diagnostic test results will improve Outcome: Progressing Goal: Respiratory complications will improve Outcome: Progressing Goal: Cardiovascular complication will be avoided Outcome: Progressing   Problem: Activity: Goal: Risk for activity intolerance will decrease Outcome: Progressing   Problem: Nutrition: Goal: Adequate nutrition will be maintained Outcome: Progressing   Problem: Coping: Goal: Level of anxiety will decrease Outcome: Progressing   Problem: Elimination: Goal: Will not experience complications related to bowel motility Outcome: Progressing Goal: Will not experience complications related to urinary retention Outcome: Progressing   Problem: Pain Managment: Goal: General experience of comfort will improve Outcome: Progressing   Problem: Safety: Goal: Ability to remain free from injury will improve Outcome: Progressing   Problem: Skin Integrity: Goal: Risk for impaired skin integrity will decrease Outcome: Progressing   Problem: Education: Goal: Knowledge about tracheostomy care/management will improve Outcome: Progressing   Problem: Activity: Goal: Ability to tolerate increased activity will improve Outcome: Progressing   Problem: Health Behavior/Discharge Planning: Goal: Ability to manage tracheostomy will improve Outcome: Progressing   Problem: Respiratory: Goal: Patent airway maintenance will improve Outcome: Progressing   Problem: Role Relationship: Goal: Ability to communicate will improve Outcome: Progressing

## 2023-07-19 LAB — GLUCOSE, CAPILLARY
Glucose-Capillary: 91 mg/dL (ref 70–99)
Glucose-Capillary: 109 mg/dL — ABNORMAL HIGH (ref 70–99)
Glucose-Capillary: 119 mg/dL — ABNORMAL HIGH (ref 70–99)
Glucose-Capillary: 129 mg/dL — ABNORMAL HIGH (ref 70–99)
Glucose-Capillary: 118 mg/dL — ABNORMAL HIGH (ref 70–99)
Glucose-Capillary: 107 mg/dL — ABNORMAL HIGH (ref 70–99)

## 2023-07-19 MED ORDER — ENSURE ENLIVE PO LIQD
237.0000 mL | Freq: Three times a day (TID) | ORAL | Status: DC
  Administered 2023-07-19 – 2023-07-22 (×8): 237 mL via ORAL
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
## 2023-07-19 NOTE — Progress Notes (Signed)
Pt refused to remove prosthesis despite education on skin breakdown, asked to sit on edge of bed and will reassess later  Upon further education pt still did not want prothesis removed overnight
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
## 2023-07-19 NOTE — Plan of Care (Signed)
  Problem: Health Behavior/Discharge Planning: Goal: Ability to manage health-related needs will improve Outcome: Progressing   Problem: Clinical Measurements: Goal: Ability to maintain clinical measurements within normal limits will improve Outcome: Progressing Goal: Will remain free from infection Outcome: Progressing Goal: Diagnostic test results will improve Outcome: Progressing Goal: Respiratory complications will improve Outcome: Progressing Goal: Cardiovascular complication will be avoided Outcome: Progressing   Problem: Activity: Goal: Risk for activity intolerance will decrease Outcome: Progressing   Problem: Nutrition: Goal: Adequate nutrition will be maintained Outcome: Progressing   Problem: Coping: Goal: Level of anxiety will decrease Outcome: Progressing   Problem: Elimination: Goal: Will not experience complications related to bowel motility Outcome: Progressing Goal: Will not experience complications related to urinary retention Outcome: Progressing   Problem: Pain Managment: Goal: General experience of comfort will improve Outcome: Progressing   Problem: Safety: Goal: Ability to remain free from injury will improve Outcome: Progressing   Problem: Skin Integrity: Goal: Risk for impaired skin integrity will decrease Outcome: Progressing   Problem: Education: Goal: Knowledge about tracheostomy care/management will improve Outcome: Progressing   Problem: Activity: Goal: Ability to tolerate increased activity will improve Outcome: Progressing   Problem: Health Behavior/Discharge Planning: Goal: Ability to manage tracheostomy will improve Outcome: Progressing   Problem: Respiratory: Goal: Patent airway maintenance will improve Outcome: Progressing   Problem: Role Relationship: Goal: Ability to communicate will improve Outcome: Progressing

## 2023-07-20 LAB — GLUCOSE, CAPILLARY
Glucose-Capillary: 103 mg/dL — ABNORMAL HIGH (ref 70–99)
Glucose-Capillary: 139 mg/dL — ABNORMAL HIGH (ref 70–99)

## 2023-07-20 NOTE — Plan of Care (Signed)
Problem: Health Behavior/Discharge Planning: Goal: Ability to manage health-related needs will improve Outcome: Progressing   Problem: Clinical Measurements: Goal: Ability to maintain clinical measurements within normal limits will improve Outcome: Progressing Goal: Will remain free from infection Outcome: Progressing Goal: Diagnostic test results will improve Outcome: Progressing Goal: Respiratory complications will improve Outcome: Progressing Goal: Cardiovascular complication will be avoided Outcome: Progressing   Problem: Activity: Goal: Risk for activity intolerance will decrease Outcome: Progressing   Problem: Nutrition: Goal: Adequate nutrition will be maintained Outcome: Progressing   Problem: Coping: Goal: Level of anxiety will decrease Outcome: Progressing   Problem: Elimination: Goal: Will not experience complications related to bowel motility Outcome: Progressing Goal: Will not experience complications related to urinary retention Outcome: Progressing   Problem: Pain Managment: Goal: General experience of comfort will improve Outcome: Progressing   Problem: Safety: Goal: Ability to remain free from injury will improve Outcome: Progressing   Problem: Skin Integrity: Goal: Risk for impaired skin integrity will decrease Outcome: Progressing   Problem: Education: Goal: Knowledge about tracheostomy care/management will improve Outcome: Progressing   Problem: Activity: Goal: Ability to tolerate increased activity will improve Outcome: Progressing   Problem: Health Behavior/Discharge Planning: Goal: Ability to manage tracheostomy will improve Outcome: Progressing   Problem: Respiratory: Goal: Patent airway maintenance will improve Outcome: Progressing   Problem: Role Relationship: Goal: Ability to communicate will improve Outcome: Progressing

## 2023-07-20 NOTE — Progress Notes (Signed)
Trach changed 07/20/23.

## 2023-07-20 NOTE — Progress Notes (Signed)
Inpatient Rehab Coordinator Note:  I met with Russell Garcia and his brother Russell Garcia at bedside to discuss CIR recommendations and goals/expectations of CIR stay.  We reviewed 3 hrs/day of therapy, physician follow up, and average length of stay 2 weeks (dependent upon progress) with goals of supervision.  Russell Garcia and brother endorse plans to d/c with Russell Garcia providing 24/7 supervision.  We reviewed need for insurance approval and I will start that once therapy has a chance to document their session.  We will follow.   Estill Dooms, Russell Garcia, DPT Admissions Coordinator [phone] 07/20/23  12:18 PM
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
## 2023-07-21 ENCOUNTER — Inpatient Hospital Stay (HOSPITAL_COMMUNITY): Payer: Medicare HMO

## 2023-07-21 LAB — COMPREHENSIVE METABOLIC PANEL
ALT: 40 U/L (ref 0–44)
AST: 27 U/L (ref 15–41)
Albumin: 2.6 g/dL — ABNORMAL LOW (ref 3.5–5.0)
Alkaline Phosphatase: 67 U/L (ref 38–126)
Anion gap: 10 (ref 5–15)
BUN: 35 mg/dL — ABNORMAL HIGH (ref 8–23)
CO2: 25 mmol/L (ref 22–32)
Calcium: 9.1 mg/dL (ref 8.9–10.3)
Chloride: 101 mmol/L (ref 98–111)
Creatinine, Ser: 1.15 mg/dL (ref 0.61–1.24)
GFR, Estimated: >60 mL/min (ref >60)
Glucose, Bld: 110 mg/dL — ABNORMAL HIGH (ref 70–99)
Potassium: 3.6 mmol/L (ref 3.5–5.1)
Sodium: 136 mmol/L (ref 135–145)
Total Bilirubin: 0.7 mg/dL (ref 0.3–1.2)
Total Protein: 7.5 g/dL (ref 6.5–8.1)

## 2023-07-21 LAB — PHOSPHORUS: Phosphorus: 3.6 mg/dL (ref 2.5–4.6)

## 2023-07-21 LAB — MAGNESIUM: Magnesium: 2.1 mg/dL (ref 1.7–2.4)

## 2023-07-21 MED ORDER — METOPROLOL TARTRATE 25 MG PO TABS
37.5000 mg | ORAL_TABLET | Freq: Four times a day (QID) | ORAL | Status: DC
  Administered 2023-07-21 – 2023-07-22 (×6): 37.5 mg via ORAL
  Filled 2023-07-21 (×5): qty 1

## 2023-07-21 MED ORDER — ASPIRIN 81 MG PO CHEW
81.0000 mg | CHEWABLE_TABLET | Freq: Every day | ORAL | Status: DC
  Administered 2023-07-21 – 2023-07-22 (×2): 81 mg via ORAL
  Filled 2023-07-21 (×2): qty 1

## 2023-07-21 MED ORDER — POLYETHYLENE GLYCOL 3350 17 G PO PACK
17.0000 g | PACK | Freq: Every day | ORAL | Status: DC
  Administered 2023-07-21 – 2023-07-22 (×2): 17 g via ORAL
  Filled 2023-07-21 (×2): qty 1

## 2023-07-21 MED ORDER — GUAIFENESIN 100 MG/5ML PO LIQD
15.0000 mL | ORAL | Status: DC
  Administered 2023-07-21 – 2023-07-22 (×7): 15 mL via ORAL
  Filled 2023-07-21: qty 15
  Filled 2023-07-21 (×6): qty 20

## 2023-07-21 MED ORDER — IRBESARTAN 300 MG PO TABS
300.0000 mg | ORAL_TABLET | Freq: Every day | ORAL | Status: DC
  Administered 2023-07-21 – 2023-07-22 (×2): 300 mg via ORAL
  Filled 2023-07-21 (×2): qty 1

## 2023-07-21 MED ORDER — OXYCODONE HCL 5 MG PO TABS
5.0000 mg | ORAL_TABLET | ORAL | Status: DC | PRN
  Administered 2023-07-21 – 2023-07-22 (×2): 5 mg via ORAL
  Filled 2023-07-21 (×2): qty 1

## 2023-07-21 MED ORDER — ATORVASTATIN CALCIUM 10 MG PO TABS
20.0000 mg | ORAL_TABLET | Freq: Every day | ORAL | Status: DC
  Administered 2023-07-21 – 2023-07-22 (×2): 20 mg via ORAL
  Filled 2023-07-21 (×2): qty 2

## 2023-07-21 MED ORDER — QUETIAPINE FUMARATE 50 MG PO TABS
50.0000 mg | ORAL_TABLET | Freq: Two times a day (BID) | ORAL | Status: DC
  Administered 2023-07-21 – 2023-07-22 (×3): 50 mg via ORAL
  Filled 2023-07-21 (×3): qty 1

## 2023-07-21 MED ORDER — ACETAMINOPHEN 325 MG PO TABS
650.0000 mg | ORAL_TABLET | Freq: Four times a day (QID) | ORAL | Status: DC
  Administered 2023-07-21 – 2023-07-22 (×5): 650 mg via ORAL
  Filled 2023-07-21 (×5): qty 2

## 2023-07-21 MED ORDER — SENNA 8.6 MG PO TABS
2.0000 | ORAL_TABLET | Freq: Every day | ORAL | Status: DC
  Administered 2023-07-21 – 2023-07-22 (×2): 17.2 mg via ORAL
  Filled 2023-07-21 (×2): qty 2

## 2023-07-21 MED ORDER — HYDROCHLOROTHIAZIDE 25 MG PO TABS
25.0000 mg | ORAL_TABLET | Freq: Every day | ORAL | Status: DC
  Administered 2023-07-21 – 2023-07-22 (×2): 25 mg via ORAL
  Filled 2023-07-21 (×2): qty 1
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
## 2023-07-21 NOTE — Plan of Care (Signed)
  Problem: Health Behavior/Discharge Planning: Goal: Ability to manage health-related needs will improve Outcome: Progressing   Problem: Clinical Measurements: Goal: Ability to maintain clinical measurements within normal limits will improve Outcome: Progressing Goal: Will remain free from infection Outcome: Progressing Goal: Diagnostic test results will improve Outcome: Progressing Goal: Respiratory complications will improve Outcome: Progressing Goal: Cardiovascular complication will be avoided Outcome: Progressing   Problem: Health Behavior/Discharge Planning: Goal: Ability to manage health-related needs will improve Outcome: Progressing   Problem: Activity: Goal: Risk for activity intolerance will decrease Outcome: Progressing

## 2023-07-21 NOTE — Progress Notes (Signed)
Inpatient Rehab Admissions Coordinator:   Awaiting determination from [facility] regarding CIR prior auth request.   Estill Dooms, PT, DPT Admissions Coordinator [phone] 07/21/23  11:40 AM
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
## 2023-07-22 ENCOUNTER — Inpatient Hospital Stay (HOSPITAL_COMMUNITY)
Admission: AD | Admit: 2023-07-22 | Discharge: 2023-08-03 | DRG: 945 | Disposition: A | Discharge status: Home Health | Payer: Medicare HMO | Source: Intra-hospital | Attending: Physical Medicine and Rehabilitation | Admitting: Physical Medicine and Rehabilitation

## 2023-07-22 ENCOUNTER — Encounter (HOSPITAL_COMMUNITY): Payer: Self-pay | Admitting: Physical Medicine and Rehabilitation

## 2023-07-22 ENCOUNTER — Encounter: Payer: Self-pay | Admitting: Orthopedic Surgery

## 2023-07-22 DIAGNOSIS — S36531D Laceration of transverse colon, subsequent encounter: Secondary | ICD-10-CM | POA: Diagnosis not present

## 2023-07-22 DIAGNOSIS — F4311 Post-traumatic stress disorder, acute: Secondary | ICD-10-CM | POA: Diagnosis not present

## 2023-07-22 DIAGNOSIS — R1312 Dysphagia, oropharyngeal phase: Secondary | ICD-10-CM

## 2023-07-22 DIAGNOSIS — Z930 Tracheostomy status: Secondary | ICD-10-CM | POA: Diagnosis not present

## 2023-07-22 DIAGNOSIS — Z430 Encounter for attention to tracheostomy: Secondary | ICD-10-CM

## 2023-07-22 DIAGNOSIS — R413 Other amnesia: Secondary | ICD-10-CM | POA: Diagnosis present

## 2023-07-22 DIAGNOSIS — M7989 Other specified soft tissue disorders: Secondary | ICD-10-CM | POA: Diagnosis not present

## 2023-07-22 DIAGNOSIS — Z4801 Encounter for change or removal of surgical wound dressing: Secondary | ICD-10-CM

## 2023-07-22 DIAGNOSIS — S36892D Contusion of other intra-abdominal organs, subsequent encounter: Secondary | ICD-10-CM

## 2023-07-22 DIAGNOSIS — R059 Cough, unspecified: Secondary | ICD-10-CM | POA: Diagnosis present

## 2023-07-22 DIAGNOSIS — R531 Weakness: Secondary | ICD-10-CM | POA: Diagnosis present

## 2023-07-22 DIAGNOSIS — W3400XA Accidental discharge from unspecified firearms or gun, initial encounter: Principal | ICD-10-CM

## 2023-07-22 DIAGNOSIS — S5411XD Injury of median nerve at forearm level, right arm, subsequent encounter: Principal | ICD-10-CM

## 2023-07-22 DIAGNOSIS — R918 Other nonspecific abnormal finding of lung field: Secondary | ICD-10-CM | POA: Diagnosis not present

## 2023-07-22 DIAGNOSIS — R053 Chronic cough: Secondary | ICD-10-CM | POA: Diagnosis present

## 2023-07-22 DIAGNOSIS — K5900 Constipation, unspecified: Secondary | ICD-10-CM | POA: Diagnosis not present

## 2023-07-22 DIAGNOSIS — Z89511 Acquired absence of right leg below knee: Secondary | ICD-10-CM

## 2023-07-22 DIAGNOSIS — T07XXXA Unspecified multiple injuries, initial encounter: Secondary | ICD-10-CM

## 2023-07-22 DIAGNOSIS — Z7409 Other reduced mobility: Secondary | ICD-10-CM | POA: Diagnosis present

## 2023-07-22 DIAGNOSIS — E785 Hyperlipidemia, unspecified: Secondary | ICD-10-CM | POA: Diagnosis present

## 2023-07-22 DIAGNOSIS — E559 Vitamin D deficiency, unspecified: Secondary | ICD-10-CM | POA: Diagnosis present

## 2023-07-22 DIAGNOSIS — R0602 Shortness of breath: Secondary | ICD-10-CM | POA: Diagnosis not present

## 2023-07-22 DIAGNOSIS — F430 Acute stress reaction: Secondary | ICD-10-CM | POA: Diagnosis present

## 2023-07-22 DIAGNOSIS — R1310 Dysphagia, unspecified: Secondary | ICD-10-CM | POA: Diagnosis not present

## 2023-07-22 DIAGNOSIS — S21131D Puncture wound without foreign body of right front wall of thorax without penetration into thoracic cavity, subsequent encounter: Secondary | ICD-10-CM

## 2023-07-22 DIAGNOSIS — R051 Acute cough: Secondary | ICD-10-CM | POA: Diagnosis not present

## 2023-07-22 DIAGNOSIS — I10 Essential (primary) hypertension: Secondary | ICD-10-CM | POA: Diagnosis not present

## 2023-07-22 DIAGNOSIS — E669 Obesity, unspecified: Secondary | ICD-10-CM | POA: Diagnosis not present

## 2023-07-22 DIAGNOSIS — R6889 Other general symptoms and signs: Secondary | ICD-10-CM | POA: Diagnosis present

## 2023-07-22 DIAGNOSIS — Z6838 Body mass index (BMI) 38.0-38.9, adult: Secondary | ICD-10-CM

## 2023-07-22 DIAGNOSIS — S45111D Laceration of brachial artery, right side, subsequent encounter: Secondary | ICD-10-CM | POA: Diagnosis not present

## 2023-07-22 DIAGNOSIS — S41131D Puncture wound without foreign body of right upper arm, subsequent encounter: Secondary | ICD-10-CM | POA: Diagnosis not present

## 2023-07-22 DIAGNOSIS — Z79899 Other long term (current) drug therapy: Secondary | ICD-10-CM

## 2023-07-22 DIAGNOSIS — W3400XD Accidental discharge from unspecified firearms or gun, subsequent encounter: Secondary | ICD-10-CM | POA: Diagnosis not present

## 2023-07-22 DIAGNOSIS — J984 Other disorders of lung: Secondary | ICD-10-CM | POA: Diagnosis not present

## 2023-07-22 DIAGNOSIS — R0989 Other specified symptoms and signs involving the circulatory and respiratory systems: Secondary | ICD-10-CM | POA: Diagnosis not present

## 2023-07-22 DIAGNOSIS — D62 Acute posthemorrhagic anemia: Secondary | ICD-10-CM | POA: Diagnosis not present

## 2023-07-22 DIAGNOSIS — M25511 Pain in right shoulder: Secondary | ICD-10-CM | POA: Diagnosis present

## 2023-07-22 DIAGNOSIS — S36523D Contusion of sigmoid colon, subsequent encounter: Secondary | ICD-10-CM | POA: Diagnosis not present

## 2023-07-22 DIAGNOSIS — J9811 Atelectasis: Secondary | ICD-10-CM | POA: Diagnosis not present

## 2023-07-22 LAB — URINALYSIS, ROUTINE W REFLEX MICROSCOPIC
Bacteria, UA: NONE SEEN
Bilirubin Urine: NEGATIVE
Glucose, UA: NEGATIVE mg/dL
Hgb urine dipstick: NEGATIVE
Ketones, ur: NEGATIVE mg/dL
Leukocytes,Ua: NEGATIVE
Nitrite: NEGATIVE
Protein, ur: 30 mg/dL — AB
Specific Gravity, Urine: 1.021 (ref 1.005–1.030)
pH: 5 (ref 5.0–8.0)

## 2023-07-22 MED ORDER — PSYLLIUM 95 % PO PACK
1.0000 | PACK | Freq: Every day | ORAL | Status: DC
  Administered 2023-07-23 – 2023-08-03 (×6): 1 via ORAL
  Filled 2023-07-22 (×12): qty 1

## 2023-07-22 MED ORDER — HYDROCHLOROTHIAZIDE 25 MG PO TABS
25.0000 mg | ORAL_TABLET | Freq: Every day | ORAL | Status: DC
  Administered 2023-07-23 – 2023-08-03 (×12): 25 mg via ORAL
  Filled 2023-07-22 (×12): qty 1

## 2023-07-22 MED ORDER — ORAL CARE MOUTH RINSE
15.0000 mL | OROMUCOSAL | Status: DC
  Administered 2023-07-22 – 2023-08-03 (×40): 15 mL via OROMUCOSAL

## 2023-07-22 MED ORDER — ORAL CARE MOUTH RINSE: 15.0000 mL | OROMUCOSAL | Status: DC | PRN

## 2023-07-22 MED ORDER — BISACODYL 10 MG RE SUPP: 10.0000 mg | Freq: Every day | RECTAL | Status: DC

## 2023-07-22 MED ORDER — METOCLOPRAMIDE HCL 5 MG/ML IJ SOLN: 10.0000 mg | Freq: Four times a day (QID) | INTRAMUSCULAR | Status: DC

## 2023-07-22 MED ORDER — ACETAMINOPHEN 325 MG PO TABS
325.0000 mg | ORAL_TABLET | ORAL | Status: DC | PRN
  Administered 2023-07-30: 650 mg via ORAL
  Filled 2023-07-22: qty 2

## 2023-07-22 MED ORDER — ENOXAPARIN SODIUM 30 MG/0.3ML IJ SOSY
30.0000 mg | PREFILLED_SYRINGE | Freq: Two times a day (BID) | INTRAMUSCULAR | Status: DC
  Administered 2023-07-22 – 2023-08-03 (×24): 30 mg via SUBCUTANEOUS
  Filled 2023-07-22 (×24): qty 0.3

## 2023-07-22 MED ORDER — METOCLOPRAMIDE HCL 5 MG PO TABS
10.0000 mg | ORAL_TABLET | Freq: Three times a day (TID) | ORAL | Status: DC
  Administered 2023-07-22 – 2023-08-03 (×43): 10 mg via ORAL
  Filled 2023-07-22 (×43): qty 2

## 2023-07-22 MED ORDER — ONDANSETRON 4 MG PO TBDP: 4.0000 mg | ORAL_TABLET | Freq: Four times a day (QID) | ORAL | Status: DC | PRN

## 2023-07-22 MED ORDER — SENNA 8.6 MG PO TABS
2.0000 | ORAL_TABLET | Freq: Every day | ORAL | Status: DC
  Administered 2023-07-23 – 2023-08-03 (×11): 17.2 mg via ORAL
  Filled 2023-07-22 (×12): qty 2

## 2023-07-22 MED ORDER — OXYCODONE HCL 5 MG PO TABS
5.0000 mg | ORAL_TABLET | ORAL | Status: DC | PRN
  Filled 2023-07-22: qty 1

## 2023-07-22 MED ORDER — ENOXAPARIN SODIUM 30 MG/0.3ML IJ SOSY: 30.0000 mg | PREFILLED_SYRINGE | Freq: Two times a day (BID) | INTRAMUSCULAR | Status: DC

## 2023-07-22 MED ORDER — METOPROLOL TARTRATE 25 MG PO TABS
37.5000 mg | ORAL_TABLET | Freq: Four times a day (QID) | ORAL | Status: DC
  Administered 2023-07-22 – 2023-07-28 (×23): 37.5 mg via ORAL
  Filled 2023-07-22 (×23): qty 1

## 2023-07-22 MED ORDER — ENSURE ENLIVE PO LIQD
237.0000 mL | Freq: Three times a day (TID) | ORAL | Status: DC
  Administered 2023-07-25: 237 mL via ORAL

## 2023-07-22 MED ORDER — IPRATROPIUM-ALBUTEROL 0.5-2.5 (3) MG/3ML IN SOLN
3.0000 mL | RESPIRATORY_TRACT | Status: DC | PRN
  Administered 2023-07-23 – 2023-07-24 (×2): 3 mL via RESPIRATORY_TRACT
  Filled 2023-07-22 (×2): qty 3

## 2023-07-22 MED ORDER — IRBESARTAN 300 MG PO TABS
300.0000 mg | ORAL_TABLET | Freq: Every day | ORAL | Status: DC
  Administered 2023-07-23 – 2023-08-03 (×12): 300 mg via ORAL
  Filled 2023-07-22 (×12): qty 1

## 2023-07-22 MED ORDER — ASPIRIN 81 MG PO CHEW
81.0000 mg | CHEWABLE_TABLET | Freq: Every day | ORAL | Status: DC
  Administered 2023-07-23 – 2023-08-03 (×12): 81 mg via ORAL
  Filled 2023-07-22 (×12): qty 1

## 2023-07-22 MED ORDER — POLYETHYLENE GLYCOL 3350 17 G PO PACK
17.0000 g | PACK | Freq: Every day | ORAL | Status: DC
  Administered 2023-07-23 – 2023-08-03 (×9): 17 g via ORAL
  Filled 2023-07-22 (×12): qty 1

## 2023-07-22 MED ORDER — ONDANSETRON HCL 4 MG/2ML IJ SOLN: 4.0000 mg | Freq: Four times a day (QID) | INTRAMUSCULAR | Status: DC | PRN

## 2023-07-22 MED ORDER — QUETIAPINE FUMARATE 50 MG PO TABS
50.0000 mg | ORAL_TABLET | Freq: Two times a day (BID) | ORAL | Status: DC
  Administered 2023-07-23 – 2023-07-25 (×5): 50 mg via ORAL
  Filled 2023-07-22 (×5): qty 1

## 2023-07-22 MED ORDER — ATORVASTATIN CALCIUM 10 MG PO TABS
20.0000 mg | ORAL_TABLET | Freq: Every day | ORAL | Status: DC
  Administered 2023-07-23 – 2023-08-03 (×12): 20 mg via ORAL
  Filled 2023-07-22 (×12): qty 2
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
## 2023-07-22 NOTE — Progress Notes (Signed)
Inpatient Rehab Admissions Coordinator:    I have insurance approval and a bed available for pt to admit to CIR today. Barnetta Chapel, PA-C, in agreement.  Will let pt/family and TOC team know.   Estill Dooms, PT, DPT Admissions Coordinator [phone] 07/22/23  10:00 AM
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
## 2023-07-22 NOTE — TOC Transition Note (Signed)
Transition of Care [facility]) - CM/SW Discharge Note   Patient Details  Name: Russell Garcia MRN: [id number] Date of Birth: 04-Feb-1956  [facility]) CM/SW Contact:  Glennon Mac, RN Phone Number: 07/22/2023, 3:12 PM   Clinical Narrative:    Patient medically stable for discharge today and insurance approval has been received for admission to [facility] CIR today.  Plan discharge to inpatient rehab when bed ready.   Final next level of care: IP Rehab Facility Barriers to Discharge: Barriers Resolved   Patient Goals and CMS Choice CMS Medicare.gov Compare Post Acute Care list provided to:: Patient Represenative (must comment) Choice offered to / list presented to : Adult Children, Sibling                          Discharge Plan and Services Additional resources added to the After Visit Summary for     Discharge Planning Services: CM Consult Post Acute Care Choice: Long Term Acute Care (LTAC)                               Social Determinants of Health (SDOH) Interventions     Readmission Risk Interventions     No data to display          Quintella Baton, RN, BSN  Trauma/Neuro ICU Case Manager [phone]

## 2023-07-22 NOTE — Progress Notes (Signed)
Orthopedic Tech Progress Note Patient Details:  Russell Garcia February 17, 1956 [id number]  I reached out to [employer] about the socket for fit, and he stated that is an outpatient process and patient would have to come to the office    Patient ID: Russell Garcia, male   DOB: 12-28-1955, 67 y.o.   MRN: [id number]  [name] 07/22/2023, 7:37 PM
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
## 2023-07-22 NOTE — Progress Notes (Signed)
Patient's IV was taken out, catheter tip intact. Patient had an episode of hematuria around 1100 while up at the toilet having a BM. Barnetta Chapel notified. Patient did not void again although tried to while help from staff using a urinal and getting up to the toilet again. I was going to bladder scan him before sending him to CIR but while giving report to the CIR nurse, she said just to send him over and she would bladder scan him there. CIR nurse was given report including about his hematuria episode. Patient was transported to CIR 4W room 20 by felicia NT via wheelchair.

## 2023-07-22 NOTE — Progress Notes (Signed)
INPATIENT REHABILITATION ADMISSION NOTE   Arrival Method: wheelchair     Mental Orientation: alert and oriented x4    Assessment: Completed see flowsheet    Skin: Intact    IV'S: none   Pain: none   Tubes and Drains: none   Safety Measures: Discussed and pt verbalizes agreement    Vital Signs: obtained   Height and Weight: completed    Rehab Orientation: reviewed . Pt in agreement     Family: not present, notified     Notes:

## 2023-07-23 ENCOUNTER — Other Ambulatory Visit: Payer: Self-pay

## 2023-07-23 DIAGNOSIS — I10 Essential (primary) hypertension: Secondary | ICD-10-CM

## 2023-07-23 DIAGNOSIS — W3400XA Accidental discharge from unspecified firearms or gun, initial encounter: Secondary | ICD-10-CM

## 2023-07-23 LAB — URINE CULTURE: Culture: NO GROWTH

## 2023-07-23 MED ORDER — GUAIFENESIN-DM 100-10 MG/5ML PO SYRP
10.0000 mL | ORAL_SOLUTION | ORAL | Status: DC | PRN
  Administered 2023-07-24 – 2023-07-28 (×3): 10 mL via ORAL
  Filled 2023-07-23 (×3): qty 10

## 2023-07-23 MED ORDER — MELATONIN 5 MG PO TABS
5.0000 mg | ORAL_TABLET | Freq: Every day | ORAL | Status: DC
  Administered 2023-07-23 – 2023-08-02 (×11): 5 mg via ORAL
  Filled 2023-07-23 (×11): qty 1

## 2023-07-23 NOTE — Progress Notes (Signed)
Pt c/o dizziness and lightheadedness when standing. BP and HR obtained in sitting and standing position.  Pt feels lightheaded at this time    07/23/23 1132  Vitals  BP (!) 127/92  MAP (mmHg) 103  BP Method Automatic  Patient Position (if appropriate) Orthostatic Vitals  Pulse Rate 89  MEWS COLOR  MEWS Score Color Green  Orthostatic Standing at 0 minutes  BP- Standing at 0 minutes (!) 149/103  Pulse- Standing at 0 minutes 114  MEWS Score  MEWS Temp 0  MEWS Systolic 0  MEWS Pulse 0  MEWS RR 0  MEWS LOC 0  MEWS Score 0
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
## 2023-07-23 NOTE — Plan of Care (Signed)
  Problem: RH Balance Goal: LTG Patient will maintain dynamic standing with ADLs (OT) Description: LTG:  Patient will maintain dynamic standing balance with assist during activities of daily living (OT)  Flowsheets (Taken 07/23/2023 0913) LTG: Pt will maintain dynamic standing balance during ADLs with: Supervision/Verbal cueing   Problem: RH Eating Goal: LTG Patient will perform eating w/assist, cues/equip (OT) Description: LTG: Patient will perform eating with assist, with/without cues using equipment (OT) Flowsheets (Taken 07/23/2023 0913) LTG: Pt will perform eating with assistance level of: Independent with assistive device  LTG: Pt will perform eating using equipment: Built up handle   Problem: RH Grooming Goal: LTG Patient will perform grooming w/assist,cues/equip (OT) Description: LTG: Patient will perform grooming with assist, with/without cues using equipment (OT) Flowsheets (Taken 07/23/2023 0913) LTG: Pt will perform grooming with assistance level of: Independent with assistive device    Problem: RH Bathing Goal: LTG Patient will bathe all body parts with assist levels (OT) Description: LTG: Patient will bathe all body parts with assist levels (OT) Flowsheets (Taken 07/23/2023 0913) LTG: Pt will perform bathing with assistance level/cueing: Set up assist  LTG: Position pt will perform bathing: At sink   Problem: RH Dressing Goal: LTG Patient will perform upper body dressing (OT) Description: LTG Patient will perform upper body dressing with assist, with/without cues (OT). Flowsheets (Taken 07/23/2023 0913) LTG: Pt will perform upper body dressing with assistance level of: Set up assist Goal: LTG Patient will perform lower body dressing w/assist (OT) Description: LTG: Patient will perform lower body dressing with assist, with/without cues in positioning using equipment (OT) Flowsheets (Taken 07/23/2023 0913) LTG: Pt will perform lower body dressing with assistance level of:  Contact Guard/Touching assist   Problem: RH Toileting Goal: LTG Patient will perform toileting task (3/3 steps) with assistance level (OT) Description: LTG: Patient will perform toileting task (3/3 steps) with assistance level (OT)  Flowsheets (Taken 07/23/2023 0913) LTG: Pt will perform toileting task (3/3 steps) with assistance level: Supervision/Verbal cueing   Problem: RH Functional Use of Upper Extremity Goal: LTG Patient will use RT/LT upper extremity as a (OT) Description: LTG: Patient will use right/left upper extremity as a stabilizer/gross assist/diminished/nondominant/dominant level with assist, with/without cues during functional activity (OT) Flowsheets (Taken 07/23/2023 0913) LTG: Use of upper extremity in functional activities: RUE as dominant level LTG: Pt will use upper extremity in functional activity with assistance level of: Independent   Problem: RH Toilet Transfers Goal: LTG Patient will perform toilet transfers w/assist (OT) Description: LTG: Patient will perform toilet transfers with assist, with/without cues using equipment (OT) Flowsheets (Taken 07/23/2023 0913) LTG: Pt will perform toilet transfers with assistance level of: Independent with assistive device   Problem: RH Tub/Shower Transfers Goal: LTG Patient will perform tub/shower transfers w/assist (OT) Description: LTG: Patient will perform tub/shower transfers with assist, with/without cues using equipment (OT) Flowsheets (Taken 07/23/2023 0913) LTG: Pt will perform tub/shower stall transfers with assistance level of: Supervision/Verbal cueing LTG: Pt will perform tub/shower transfers from: Tub/shower combination
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
## 2023-07-23 NOTE — Consult Note (Signed)
WOC Nurse Consult Note: Reason for Consult: abdominal incision Patient was followed by [facility] nursing while inpatient for this wound. However NPWT has been DC, saline dressings have been ordered prior to transfer to IP rehab Wound type: surgical Pressure Injury POA: NA Measurement: see nursing flowsheet  Wound ZOX:WRUEA  Drainage (amount, consistency, odor) see nursing flow sheets Periwound intact  Dressing procedure/placement/frequency: Continue saline moist gauze dressings as ordered at the time of inpatient rehab admission  Discussed POC with patient and bedside nurse.  Re consult if needed, will not follow at this time. Thanks  Audrina Marten [employer], RN,CWOCN, CNS, CWON-AP [phone])

## 2023-07-23 NOTE — Plan of Care (Signed)
  Problem: RH Swallowing Goal: LTG Patient will consume least restrictive diet using compensatory strategies with assistance (SLP) Description: LTG:  Patient will consume least restrictive diet using compensatory strategies with assistance (SLP) Flowsheets (Taken 07/23/2023 1414) LTG: Pt Patient will consume least restrictive diet using compensatory strategies with assistance of (SLP): Minimal Assistance - Patient > 75%

## 2023-07-23 NOTE — Plan of Care (Signed)
  Problem: RH Balance Goal: LTG Patient will maintain dynamic standing balance (PT) Description: LTG:  Patient will maintain dynamic standing balance with assistance during mobility activities (PT) Flowsheets (Taken 07/23/2023 1604) LTG: Pt will maintain dynamic standing balance during mobility activities with:: Independent with assistive device    Problem: Sit to Stand Goal: LTG:  Patient will perform sit to stand with assistance level (PT) Description: LTG:  Patient will perform sit to stand with assistance level (PT) Flowsheets (Taken 07/23/2023 1604) LTG: PT will perform sit to stand in preparation for functional mobility with assistance level: Independent with assistive device   Problem: RH Bed Mobility Goal: LTG Patient will perform bed mobility with assist (PT) Description: LTG: Patient will perform bed mobility with assistance, with/without cues (PT). Flowsheets (Taken 07/23/2023 1604) LTG: Pt will perform bed mobility with assistance level of: Independent with assistive device    Problem: RH Bed to Chair Transfers Goal: LTG Patient will perform bed/chair transfers w/assist (PT) Description: LTG: Patient will perform bed to chair transfers with assistance (PT). Flowsheets (Taken 07/23/2023 1604) LTG: Pt will perform Bed to Chair Transfers with assistance level: Supervision/Verbal cueing   Problem: RH Car Transfers Goal: LTG Patient will perform car transfers with assist (PT) Description: LTG: Patient will perform car transfers with assistance (PT). Flowsheets (Taken 07/23/2023 1604) LTG: Pt will perform car transfers with assist:: Supervision/Verbal cueing   Problem: RH Furniture Transfers Goal: LTG Patient will perform furniture transfers w/assist (OT/PT) Description: LTG: Patient will perform furniture transfers  with assistance (OT/PT). Flowsheets (Taken 07/23/2023 1604) LTG: Pt will perform furniture transfers with assist:: Supervision/Verbal cueing   Problem: RH  Ambulation Goal: LTG Patient will ambulate in controlled environment (PT) Description: LTG: Patient will ambulate in a controlled environment, # of feet with assistance (PT). Flowsheets (Taken 07/23/2023 1604) LTG: Pt will ambulate in controlled environ  assist needed:: Independent with assistive device LTG: Ambulation distance in controlled environment: at least 150 ft using LRAD Goal: LTG Patient will ambulate in home environment (PT) Description: LTG: Patient will ambulate in home environment, # of feet with assistance (PT). Flowsheets (Taken 07/23/2023 1604) LTG: Pt will ambulate in home environ  assist needed:: Independent with assistive device LTG: Ambulation distance in home environment: up to 50 ft per bout using LRAD   Problem: RH Stairs Goal: LTG Patient will ambulate up and down stairs w/assist (PT) Description: LTG: Patient will ambulate up and down # of stairs with assistance (PT) Flowsheets (Taken 07/23/2023 1604) LTG: Pt will ambulate up/down stairs assist needed:: Supervision/Verbal cueing LTG: Pt will  ambulate up and down number of stairs: at least 1 threshold step using LRAD in order to enter/ exit home

## 2023-07-23 NOTE — Progress Notes (Signed)
Inpatient Rehabilitation Admission Medication Review by a Pharmacist  A complete drug regimen review was completed for this patient to identify any potential clinically significant medication issues.  High Risk Drug Classes Is patient taking? Indication by Medication  Antipsychotic Yes Quetiapine - mood, agitation  Anticoagulant Yes Lovenox - VTE ppx  Antibiotic No   Opioid Yes Oxycodone prn pain  Antiplatelet No   Hypoglycemics/insulin No   Vasoactive Medication Yes Hydrochlorothiazide, irbesartan, metoprolol - HTN  Chemotherapy No   Other Yes Ondansetron prn N/V Atorvastatin - HLD Metoclopramide - Reflux     Type of Medication Issue Identified Description of Issue Recommendation(s)  Drug Interaction(s) (clinically significant)     Duplicate Therapy     Allergy     No Medication Administration End Date     Incorrect Dose     Additional Drug Therapy Needed     Significant med changes from prior encounter (inform family/care partners about these prior to discharge).    Other       Clinically significant medication issues were identified that warrant physician communication and completion of prescribed/recommended actions by midnight of the next day:  No  Name of provider notified for urgent issues identified:   Provider Method of Notification:     Pharmacist comments: None  Time spent performing this drug regimen review (minutes):  20 minutes  Thank you Okey Regal, PharmD
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
## 2023-07-24 ENCOUNTER — Inpatient Hospital Stay (HOSPITAL_COMMUNITY): Payer: Medicare HMO

## 2023-07-24 DIAGNOSIS — I10 Essential (primary) hypertension: Secondary | ICD-10-CM | POA: Diagnosis not present

## 2023-07-24 DIAGNOSIS — M7989 Other specified soft tissue disorders: Secondary | ICD-10-CM | POA: Diagnosis not present

## 2023-07-24 DIAGNOSIS — W3400XA Accidental discharge from unspecified firearms or gun, initial encounter: Secondary | ICD-10-CM | POA: Diagnosis not present
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
## 2023-07-24 NOTE — Discharge Instructions (Addendum)
Inpatient Rehab Discharge Instructions  Russell Garcia Discharge date and time: No discharge date for patient encounter.   Activities/Precautions/ Functional Status: Activity: As tolerated Diet: Regular Wound Care: Routine skin checks Functional status:  ___ No restrictions     ___ Walk up steps independently ___ 24/7 supervision/assistance   ___ Walk up steps with assistance ___ Intermittent supervision/assistance  ___ Bathe/dress independently ___ Walk with walker     _x__ Bathe/dress with assistance ___ Walk Independently    ___ Shower independently ___ Walk with assistance    ___ Shower with assistance ___ No alcohol     ___ Return to work/school ________  COMMUNITY REFERRALS UPON DISCHARGE:    Home Health:   PT     OT     RN                    Agency: [facility]  Phone: [phone]    Special Instructions: No driving smoking or alcohol  Pack midline wound with saline moistened 4 x 4, cover with ABD and secure with tape.  Change daily   My questions have been answered and I understand these instructions. I will adhere to these goals and the provided educational materials after my discharge from the hospital.  Patient/Caregiver Signature _______________________________ Date __________  Clinician Signature _______________________________________ Date __________  Please bring this form and your medication list with you to all your follow-up doctor's appointments.

## 2023-07-24 NOTE — Progress Notes (Signed)
Per Patient request, Lovie Macadamia was removed from the visitor list and his fiance Gavin Pound was added.

## 2023-07-24 NOTE — Progress Notes (Signed)
Lower extremity venous duplex completed. Please see CV Procedures for preliminary results.  Shona Simpson, RVT 07/24/23 9:20 AM

## 2023-07-24 NOTE — Progress Notes (Signed)
Occupational Therapy Session Note  Patient Details  Name: Russell Garcia MRN: [id number] Date of Birth: 09/15/1956  {CHL IP REHAB OT TIME CALCULATIONS:[id number]}   Short Term Goals: Week 1:  OT Short Term Goal 1 (Week 1): Patient to perform toilet tranfser with Min assist OT Short Term Goal 2 (Week 1): Patient to perform self feeding with adapted silverware with set up assist OT Short Term Goal 3 (Week 1): Patient to perform UB dressing with Min assist OT Short Term Goal 4 (Week 1): Patient to perform bathing with Mod assist using AE as needed  Skilled Therapeutic Interventions/Progress Updates:    Patient agreeable to participate in OT session. Reports *** pain level.   Patient participated in skilled OT session focusing on ***. Therapist facilitated/assessed/developed/educated/integrated/elicited *** in order to improve/facilitate/promote    Therapy Documentation Precautions:  Precautions Precautions: Fall Precaution Comments: Prior R BKA (has prosthesis); abdomenal incision; capped trach; watch HR Restrictions Weight Bearing Restrictions: No  Therapy/Group: Individual Therapy  Limmie Patricia, OTR/L,CBIS  Supplemental OT - [facility] and [facility] Secure Chat Preferred   07/24/2023, 10:07 PM

## 2023-07-25 DIAGNOSIS — W3400XA Accidental discharge from unspecified firearms or gun, initial encounter: Secondary | ICD-10-CM | POA: Diagnosis not present

## 2023-07-25 DIAGNOSIS — S5411XD Injury of median nerve at forearm level, right arm, subsequent encounter: Secondary | ICD-10-CM | POA: Diagnosis not present

## 2023-07-25 LAB — CBC WITH DIFFERENTIAL/PLATELET
Abs Immature Granulocytes: 0.03 10*3/uL (ref 0.00–0.07)
Basophils Absolute: 0 10*3/uL (ref 0.0–0.1)
Basophils Relative: 1 %
Eosinophils Absolute: 0.2 10*3/uL (ref 0.0–0.5)
Eosinophils Relative: 2 %
HCT: 31.6 % — ABNORMAL LOW (ref 39.0–52.0)
Hemoglobin: 9.9 g/dL — ABNORMAL LOW (ref 13.0–17.0)
Immature Granulocytes: 0 %
Lymphocytes Relative: 34 %
Lymphs Abs: 2.4 10*3/uL (ref 0.7–4.0)
MCH: 25.7 pg — ABNORMAL LOW (ref 26.0–34.0)
MCHC: 31.3 g/dL (ref 30.0–36.0)
MCV: 82.1 fL (ref 80.0–100.0)
Monocytes Absolute: 1 10*3/uL (ref 0.1–1.0)
Monocytes Relative: 14 %
Neutro Abs: 3.4 10*3/uL (ref 1.7–7.7)
Neutrophils Relative %: 49 %
Platelets: 200 10*3/uL (ref 150–400)
RBC: 3.85 MIL/uL — ABNORMAL LOW (ref 4.22–5.81)
RDW: 17.4 % — ABNORMAL HIGH (ref 11.5–15.5)
WBC: 7.1 10*3/uL (ref 4.0–10.5)
nRBC: 0 % (ref 0.0–0.2)

## 2023-07-25 LAB — COMPREHENSIVE METABOLIC PANEL
ALT: 33 U/L (ref 0–44)
AST: 21 U/L (ref 15–41)
Albumin: 3 g/dL — ABNORMAL LOW (ref 3.5–5.0)
Alkaline Phosphatase: 74 U/L (ref 38–126)
Anion gap: 7 (ref 5–15)
BUN: 20 mg/dL (ref 8–23)
CO2: 27 mmol/L (ref 22–32)
Calcium: 9.2 mg/dL (ref 8.9–10.3)
Chloride: 100 mmol/L (ref 98–111)
Creatinine, Ser: 1.19 mg/dL (ref 0.61–1.24)
GFR, Estimated: >60 mL/min (ref >60)
Glucose, Bld: 120 mg/dL — ABNORMAL HIGH (ref 70–99)
Potassium: 4 mmol/L (ref 3.5–5.1)
Sodium: 134 mmol/L — ABNORMAL LOW (ref 135–145)
Total Bilirubin: 0.4 mg/dL (ref 0.3–1.2)
Total Protein: 7.7 g/dL (ref 6.5–8.1)

## 2023-07-25 LAB — VITAMIN D 25 HYDROXY (VIT D DEFICIENCY, FRACTURES): Vit D, 25-Hydroxy: 18.58 ng/mL — ABNORMAL LOW (ref 30–100)

## 2023-07-25 MED ORDER — TRAZODONE HCL 50 MG PO TABS
50.0000 mg | ORAL_TABLET | Freq: Every day | ORAL | Status: DC
  Administered 2023-07-25 – 2023-08-02 (×9): 50 mg via ORAL
  Filled 2023-07-25 (×9): qty 1

## 2023-07-25 MED ORDER — OXYCODONE HCL 5 MG PO TABS: 5.0000 mg | ORAL_TABLET | Freq: Four times a day (QID) | ORAL | Status: DC | PRN

## 2023-07-25 MED ORDER — MAGNESIUM GLUCONATE 500 MG PO TABS
250.0000 mg | ORAL_TABLET | Freq: Every day | ORAL | Status: DC
  Administered 2023-07-25 – 2023-08-02 (×9): 250 mg via ORAL
  Filled 2023-07-25 (×9): qty 1

## 2023-07-25 MED ORDER — QUETIAPINE FUMARATE 50 MG PO TABS
50.0000 mg | ORAL_TABLET | Freq: Every day | ORAL | Status: DC
  Administered 2023-07-25 – 2023-08-02 (×9): 50 mg via ORAL
  Filled 2023-07-25 (×9): qty 1
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
## 2023-07-25 NOTE — Progress Notes (Signed)
Physical Therapy Session Note  Patient Details  Name: Russell Garcia MRN: [id number] Date of Birth: 01-16-1956  Today's Date: 07/25/2023 PT Individual Time: 1302-1348 PT Individual Time Calculation (min): 46 min   Short Term Goals: Week 1:  PT Short Term Goal 1 (Week 1): Pt will perform bed mobility with overall MinA. PT Short Term Goal 2 (Week 1): Pt will perform functional standing transfers with MinA. PT Short Term Goal 3 (Week 1): Pt will ambulate at least 50 ft per bout using LRAD with CGA. PT Short Term Goal 4 (Week 1): Pt will complete at least 1 step using RW with no more than ModA.  Skilled Therapeutic Interventions/Progress Updates:  Patient seated upright in w/c on entrance to room. Patient alert and agreeable to PT session.   Patient with no pain complaint at start of session.  New prosthetic socket socks size 3 and size 5 delivered from [facility].   Therapeutic Activity: Transfers: Pt performed sit<>stand and stand pivot transfers throughout session with ModA. By end of session, pt improves to MinA with increased strength/ effort. Provided verbal cues for positioning, use of BUE on armrests, forward lean.   Gait Training:  Pt ambulated 100 ft using RW with CGA/ supervision. Demonstrated forward flexed posture, slight IR turn of prosthetic. Provided vc/ tc for upright posture, RW positioning.  Pt guided in curb step training as he has one step to enter home and d/c location. Pt requires minimal vc to complete and has strength to correctly power up onto 5" step and correctly balance to maneuver RW on and off. Then steps down with RLE safely and is able to turn and return over platform with overall CGA.   Neuromuscular Re-ed: NMR facilitated during session with focus on dynamic standing balance. Pt guided in dynamic stepping activity with instructions for one step toward each tray table to each side of pt. Picking up one cone from table to L and placing on top of matching  color disc to R. Pt is unable to take just one step to maintain balance and shuffles to each side but is able to hold cone and use RW to place cone.    NMR performed for improvements in motor control and coordination, balance, sequencing, judgement, and self confidence/ efficacy in performing all aspects of mobility at highest level of independence.   Patient seated uprihgt in recliner at end of session with BLE elevated, brakes locked, seat pad alarm set, and all needs within reach.   Therapy Documentation Precautions:  Precautions Precautions: Fall Precaution Comments: Prior R BKA (has prosthesis); abdomenal incision; capped trach; watch HR Restrictions Weight Bearing Restrictions: No General:   Vital Signs:  Pain: No pain related this session.   Therapy/Group: Individual Therapy  Loel Dubonnet PT, DPT, CSRS 07/25/2023, 12:37 PM

## 2023-07-25 NOTE — Progress Notes (Signed)
Inpatient Rehabilitation  Patient information reviewed and entered into eRehab system by Demarrio Menges Raegen Tarpley, OTR/L, Rehab Quality Coordinator.   Information including medical coding, functional ability and quality indicators will be reviewed and updated through discharge.   

## 2023-07-25 NOTE — Progress Notes (Signed)
Pt's Tracheostomy tube was removed Per MD. Pt is currently VSS on RA.

## 2023-07-25 NOTE — Progress Notes (Signed)
Speech Language Pathology Daily Session Note  Patient Details  Name: Russell Garcia MRN: [id number] Date of Birth: [birth year]  Today's Date: 07/25/2023 SLP Individual Time: 1100-1200 SLP Individual Time Calculation (min): 60 min  Short Term Goals: Week 1: SLP Short Term Goal 1 (Week 1): Pt will recall safe swallow strategies and aspiration precautions related to his PMV with 80% acc when provided min A. SLP Short Term Goal 2 (Week 1): Pt will recall long-term memories using external memory aids with 80% acc when provided min A. SLP Short Term Goal 3 (Week 1): Pt will recall new information in the current environment with 80% acc when provided min A.  Skilled Therapeutic Interventions: SLP conducted skilled therapy session targeting cognitive retraining goals. SLP received patient upright in wheelchair with patient requesting to complete speech therapy session outside of his room. SLP assisted patient with transit to speech therapy office via wheelchair. Throughout session, patient taking drinks of water from straw with SLP noting no overt s/sx of penetration/aspiration across trials. Notably, patient's trach is capped. Upon SLP receipt of patient, SpO2 at 95%. SLP unplugged patient's pulse oximeter for duration of session d/t transit to new location, however when patient was returned to room, pulse ox replaced with SpO2 reading at 96%. During cognitive tasks, patient oriented x4. Patient endorses some difficulty focusing and appeared mildly internally distracted. SLP guided patient through 8-minute body scan to facilitate focus and reduce stress prior to initiation of cognitive tasks. Patient educated on WRAP memory strategies, then distracted by 20 minute working-memory task. Patient completed basic working memory task with modI, then required minA to recall WRAP memory strategies after 20 minute distracted delay. MD entered during session with patient requiring minA to recall concerns for MD. Patient  returned to room, left in chair with call bell in reach and chair alarm set. SLP will continue to target goals per plan of care.       Pain Pain Assessment Pain Scale: 0-10 Pain Score: 0-No pain  Therapy/Group: Individual Therapy  [name], M.A., CCC-SLP  [name] 07/25/2023, 12:35 PM
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
## 2023-07-25 NOTE — Progress Notes (Signed)
Inpatient Rehabilitation Center Individual Statement of Services  Patient Name:  Russell Garcia  Date:  07/25/2023  Welcome to the Inpatient Rehabilitation Center.  Our goal is to provide you with an individualized program based on your diagnosis and situation, designed to meet your specific needs.  With this comprehensive rehabilitation program, you will be expected to participate in at least 3 hours of rehabilitation therapies Monday-Friday, with modified therapy programming on the weekends.  Your rehabilitation program will include the following services:  Physical Therapy (PT), Occupational Therapy (OT), Speech Therapy (ST), 24 hour per day rehabilitation nursing, Therapeutic Recreaction (TR), Neuropsychology, Care Coordinator, Rehabilitation Medicine, Nutrition Services, Pharmacy Services, and Other  Weekly team conferences will be held on Wednesdays to discuss your progress.  Your Inpatient Rehabilitation Care Coordinator will talk with you frequently to get your input and to update you on team discussions.  Team conferences with you and your family in attendance may also be held.  Expected length of stay: 18-21 days   Overall anticipated outcome: Sup/MOD I  Depending on your progress and recovery, your program may change. Your Inpatient Rehabilitation Care Coordinator will coordinate services and will keep you informed of any changes. Your Inpatient Rehabilitation Care Coordinator's name and contact numbers are listed  below.  The following services may also be recommended but are not provided by the Inpatient Rehabilitation Center:   Home Health Rehabiltiation Services Outpatient Rehabilitation Services    Arrangements will be made to provide these services after discharge if needed.  Arrangements include referral to agencies that provide these services.  Your insurance has been verified to be:  [employer] Your primary doctor is:  Everrett Coombe, DO  Pertinent information will be  shared with your doctor and your insurance company.  Inpatient Rehabilitation Care Coordinator:  Lavera Guise, [place] [phone] or [phone]  Information discussed with and copy given to patient by: Andria Rhein, 07/25/2023, 10:06 AM

## 2023-07-26 DIAGNOSIS — W3400XA Accidental discharge from unspecified firearms or gun, initial encounter: Secondary | ICD-10-CM | POA: Diagnosis not present

## 2023-07-26 DIAGNOSIS — S5411XD Injury of median nerve at forearm level, right arm, subsequent encounter: Secondary | ICD-10-CM | POA: Diagnosis not present

## 2023-07-26 MED ORDER — VITAMIN D 25 MCG (1000 UNIT) PO TABS
1000.0000 [IU] | ORAL_TABLET | Freq: Every day | ORAL | Status: DC
  Administered 2023-07-26 – 2023-07-27 (×2): 1000 [IU] via ORAL
  Filled 2023-07-26 (×2): qty 1

## 2023-07-26 NOTE — Progress Notes (Signed)
Physical Therapy Session Note  Patient Details  Name: Russell Garcia MRN: [id number] Date of Birth: 1956/09/05  Today's Date: 07/26/2023 PT Individual Time: 0730-0839 and 1001-1056 PT Individual Time Calculation (min): 69 min and 55 min  Short Term Goals: Week 1:  PT Short Term Goal 1 (Week 1): Pt will perform bed mobility with overall MinA. PT Short Term Goal 2 (Week 1): Pt will perform functional standing transfers with MinA. PT Short Term Goal 3 (Week 1): Pt will ambulate at least 50 ft per bout using LRAD with CGA. PT Short Term Goal 4 (Week 1): Pt will complete at least 1 step using RW with no more than ModA.  Skilled Therapeutic Interventions/Progress Updates:   Treatment Session 1 Received pt sitting in recliner with RN present. Pt agreeable to PT treatment and did not state pain level but reported "tightness" in hands and difficulty gripping. Session with emphasis on functional mobility/transfers, dressing, generalized strengthening and endurance, dynamic standing balance/coordination, and gait training.   Pt requested to clean prosthetic leg this morning, reporting not taking it off in 3 days. Educated pt on wear schedule for prosthetic and importance of allowing time for leg to "breathe". Wound care RN arrived to change abdominal dressing and pt requested to lie down. Stood from recliner with RW and mod A and performed stand<>pivot to bed with RW and min A. Transitioned into supine with mod A for BLE management and RN changed dressing. Removed pants, prosthetic, and liner and cleaned pt's liner while pt washed residual limb. Donned gel liner, green sock, and prosthetic in supine with +2 assist, then pulled brown protective sleeve over top of prosthetic. Donned pants in supine with max A and pt transferred semi-reclined<>sitting EOB with HOB elevated and use of bedrails with mod A. Stood from elevated EOB with RW and min A, required min A to pull pants over hips, and ambulated to [facility] with RW  and min A. Pt sat in WC at sink and brushed teeth with set up assist.   Pt transported to/from dayroom in [facility] dependently per pt request. Stood from [facility] with RW and mod A and ambulated 175ft with RW and min A - pt limited by fatigue stating "It's amazing how weak you get being in bed" and reported 8/10 fatigue after ambulating. Transitioned to the following seated exercises with emphasis on LE strength/ROM: -LAQ with 2.5lb ankle weight 2x20 bilaterally -marches with 2.5lb ankle weight 2x20 bilaterally -hip adduction ball squeezes 2x10 with 5 second isometric hold Transferred mat<>WC stand<>pivot with RW and mod A and returned to room. Concluded session with pt sitting in WC, needs within reach, and seatbelt alarm on.   Treatment Session 2 Received pt sitting in WC, pt agreeable to PT treatment, and reported pain was "okay". Session with emphasis on functional mobility, generalized strengthening and endurance, and gait training. SPO2 88-94% throughout session on RA. Pt transported to/from room in [facility] dependently for time management purposes. Pt requested to focus on hands/wrists during session and performed the following stretches/exercises with emphasis on ROM: -wrist extension stretch 4x20 second hold bilaterally -prayer stretch 3x30 second hold -pronation/supination with 3lb dumbbell x10 bilaterally -reverse prayer stretch 3x30 second hold  Provided pt with HEP and educated on frequency/duration/technique for the following exercises: - Seated Wrist Extension Stretch  - 1 x daily - 7 x weekly - 4 sets - 20 hold - Seated Wrist Prayer Stretch  - 1 x daily - 7 x weekly - 3 sets - 30 hold -  Forearm Pronation with Dumbbell  - 1 x daily - 7 x weekly - 3 sets - 10 reps - Forearm Supination with Dumbbell  - 1 x daily - 7 x weekly - 3 sets - 10 reps - Seated Reverse Wrist Prayer Stretch  - 1 x daily - 7 x weekly - 3 sets - 10 reps Stood from [facility] with RW and light mod A and ambulated 128ft with RW and CGA/min  A back to room. Pt stood from recliner with RW and mod A x 3 trials and performed the following exercises with emphasis on LE strength/ROM:   -alternating marches x10 bilaterally -hip abduction 2x10 bilaterally  -mini squats 2x10 Pt required multiple rest breaks throughout session due to fatigue but remains motivated. Concluded session with pt sitting in recliner with all needs within reach awaiting upcoming OT session.   Therapy Documentation Precautions:  Precautions Precautions: Fall Precaution Comments: Prior R BKA (has prosthesis); abdomenal incision; capped trach; watch HR Restrictions Weight Bearing Restrictions: No  Therapy/Group: Individual Therapy Marlana Salvage Zaunegger Blima Rich PT, DPT 07/26/2023, 6:51 AM
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
## 2023-07-26 NOTE — Progress Notes (Signed)
Speech Language Pathology Daily Session Note  Patient Details  Name: Russell Garcia MRN: [id number] Date of Birth: Oct 31, 1955  Today's Date: 07/26/2023 SLP Individual Time: 1400-1448 SLP Individual Time Calculation (min): 48 min  Short Term Goals: Week 1: SLP Short Term Goal 1 (Week 1): Pt will recall safe swallow strategies and aspiration precautions related to his PMV with 80% acc when provided min A. SLP Short Term Goal 2 (Week 1): Pt will recall long-term memories using external memory aids with 80% acc when provided min A. SLP Short Term Goal 3 (Week 1): Pt will recall new information in the current environment with 80% acc when provided min A.  Skilled Therapeutic Interventions: SLP conducted skilled therapy session targeting cognitive retraining goals. Patient greeted in recliner after having finished lunch. Patient reports no difficulty with lunch tray. Patient requested transit to new location for chance to get out of his room with SLP assisting patient with transfer to wheelchair for transition to speech therapy office. Patient oriented x4. Patient recalled 2/4 memory strategies and broad daily events with modI, however required minA to recall names of prior therapists and other 2 memory strategies introduced at previous session. SLP and patient discussed current medications with patient requiring minA to recall medication uses and dose timing. During medication error identification task, patient required supervisionA to identify errors made in TID pill box. Patient transferred back to room and left in bed with all needs within reach and alarm set. SLP will continue to follow plan of care as indicated.      Pain Pain Assessment Pain Scale: 0-10 Pain Score: 0-No pain  Therapy/Group: Individual Therapy  [name], M.A., CCC-SLP  [name] 07/26/2023, 4:17 PM

## 2023-07-26 NOTE — Progress Notes (Signed)
Occupational Therapy Session Note  Patient Details  Name: Russell Garcia MRN: [id number] Date of Birth: 07-29-1956  Today's Date: 07/26/2023 OT Individual Time: 1120-1200 OT Individual Time Calculation (min): 40 min    Short Term Goals: Week 1:  OT Short Term Goal 1 (Week 1): Patient to perform toilet tranfser with Min assist OT Short Term Goal 2 (Week 1): Patient to perform self feeding with adapted silverware with set up assist OT Short Term Goal 3 (Week 1): Patient to perform UB dressing with Min assist OT Short Term Goal 4 (Week 1): Patient to perform bathing with Mod assist using AE as needed  Skilled Therapeutic Interventions/Progress Updates:    Skilled OT intervention completed with focus on ADL retraining and activity tolerance. Pt received seated in recliner, agreeable to session. Intermittent pain reported in RUE with ROM; pre-medicated. OT offered rest breaks and repositioning throughout for pain reduction.  Pt requesting to bathe; unable to shower due to wounds per MD. Heavy min A needed to stand with RW, then CGA ambulatory transfer > w/c at sink with cues needed to clear RLE during stepping and positioning cues for backing up to w/c.  Pt presents with delayed processing, requiring increased time to initiate tasks. Mod A needed to doff shirt with cues for doffing over head. Pt presents with RUE > LUE ROM/weakness/pain level, but with time able to complete bathing of RUE, and parts of LUE but assist needed for LUE underarm. Able to open deodorant lid, in which prior inability was reported. Able to reach BUE to head with compensatory strategy using sink for head bathing. Education provided on hemi technique for donning of shirt, with step by step cues needed but overall mod A.  Pt had been de-cannulated prior to session, with O2 sats remaining 90% or higher however bandage noted to be halfway off; nurse notified for care.  Min A sit > stand using RW and ambulatory transfer > recliner  with again cues to clear RLE (prosthesis) with gait especially during turning. MD present for rounds. Pt remained semi upright in bed, with chair alarm on/activated, and with all needs in reach at end of session.   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

## 2023-07-27 DIAGNOSIS — W3400XA Accidental discharge from unspecified firearms or gun, initial encounter: Secondary | ICD-10-CM | POA: Diagnosis not present

## 2023-07-27 DIAGNOSIS — S5411XD Injury of median nerve at forearm level, right arm, subsequent encounter: Secondary | ICD-10-CM | POA: Diagnosis not present

## 2023-07-27 MED ORDER — VITAMIN D 25 MCG (1000 UNIT) PO TABS
2000.0000 [IU] | ORAL_TABLET | Freq: Every day | ORAL | Status: DC
  Administered 2023-07-28 – 2023-07-29 (×2): 2000 [IU] via ORAL
  Filled 2023-07-27 (×2): qty 2
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
## 2023-07-27 NOTE — Progress Notes (Signed)
Patient ID: Russell Garcia, male   DOB: 1956-10-13, 67 y.o.   MRN: [id number]  Team Conference Report to Patient/Family  Team Conference discussion was reviewed with the patient and caregiver, including goals, any changes in plan of care and target discharge date.  Patient and caregiver express understanding and are in agreement.  The patient has a target discharge date of 08/03/23.  Sw met with patient and provided team conference updates. Sw called pt's brother, Casimiro Needle and provided updates. No additional questions or concerns currently.   Andria Rhein 07/27/2023, 2:38 PM

## 2023-07-27 NOTE — Progress Notes (Signed)
Speech Language Pathology Daily Session Note  Patient Details  Name: Russell Garcia MRN: [id number] Date of Birth: Jul 05, 1956  Today's Date: 07/27/2023 SLP Individual Time: 1000-1102 SLP Individual Time Calculation (min): 62 min  Short Term Goals: Week 1: SLP Short Term Goal 1 (Week 1): Pt will recall safe swallow strategies and aspiration precautions related to his PMV with 80% acc when provided min A. SLP Short Term Goal 2 (Week 1): Pt will recall long-term memories using external memory aids with 80% acc when provided min A. SLP Short Term Goal 3 (Week 1): Pt will recall new information in the current environment with 80% acc when provided min A.  Skilled Therapeutic Interventions: SLP conducted skilled therapy session targeting cognitive retraining goals. Patient assisted with transfer from recliner to wheelchair, then transferred to speech therapy office for change of scenery from patient's room. Patient recalled WRAP memory strategies with 100% accuracy with supervisionA and increased time. Patient provided examples of all WRAP strategies and situations in which they could be beneficial. SLP guided patient through memory task and encouraged him to utilize 'write it down' strategy to assist with recall. Patient reports difficulty with handwriting since admission, demonstrating reduced legibility, though benefited from modA verbal cues to increase letter size to promote legibility. 100% legible with this strategy employed. Utilizing 'write it down' strategy to recall dates, patient recalled 85% of verbally presented information given supervision-minA. During functional working memory card game task, patient benefited from minA to recall minute details of game rules throughout. Patient returned to room and requested to get into bed. SLP provided assist, then bed was lowered with alarm set, nursing in room to assume care. SLP will continue with plan of care as indicated.      Pain Pain  Assessment Pain Scale: 0-10 Pain Score: 0-No pain  Therapy/Group: Individual Therapy  Russell Garcia, M.A., CCC-SLP  [name] 07/27/2023, 12:43 PM
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
## 2023-07-27 NOTE — Progress Notes (Signed)
Physical Therapy Session Note  Patient Details  Name: Russell Garcia MRN: [id number] Date of Birth: 11-27-1955  Today's Date: 07/27/2023 PT Individual Time: 0731-0828 and 1300-1326 PT Individual Time Calculation (min): 57 min and 26 min  Short Term Goals: Week 1:  PT Short Term Goal 1 (Week 1): Pt will perform bed mobility with overall MinA. PT Short Term Goal 2 (Week 1): Pt will perform functional standing transfers with MinA. PT Short Term Goal 3 (Week 1): Pt will ambulate at least 50 ft per bout using LRAD with CGA. PT Short Term Goal 4 (Week 1): Pt will complete at least 1 step using RW with no more than ModA.  Skilled Therapeutic Interventions/Progress Updates:   Treatment Session 1 Received pt sitting in recliner, pt agreeable to PT treatment, and reported pain 4/10 in shoulders, back, and L leg - declined pain medication. Pt slower to initiate and with delayed processing this morning. Pt reported not receiving breakfast - RN and NT notified and tray arrived shortly after. Pt requested to clean prosthetic this morning - stood from recliner with RW and min A and transferred recliner<>bed stand<>pivot with RW and min A.  Pt transferred sit<>supine with min A for BLE management and able to bridge for therapist to remove pants, prosthetic, and liner. Smelled odor coming from liner - pt reported sleeping with his prosthetic on. Educated pt on importance of leaving prosthetic off at night for improved skin integrity, but pt reported he has "always slept with it on". While pt washed residual limb, therapist washed liner. Donned clean liner and prosthetic in supine with max A - pt pushing on footboard of bed to click in leg. Required max A to don pants and bridged to pull pants up to waist. Transferred semi-reclined<>sitting L EOB with HOB slightly elevated and heavy min A for trunk control. Stood from elevated EOB with RW and CGA and required total A to pull pants remainder of way over hips, then  transferred into recliner with RW and CGA to eat breakfast. Assisted pt by opening up cereal, milk, juice, and peaches - otherwise pt independent with eating. Stood from recliner with RW and mod A and ambulated to sink. Pt sat in WC and washed face and brushed teeth with set up assist. Stood from [facility] with RW and mod A and ambulated back to recliner with RW and min A. Concluded session with pt sitting in recliner, needs within reach, and chair pad alarm on.   Treatment Session 2 Received pt sitting EOB with NT present. Pt agreeable to PT treatment and reported pain 5/10 in R shoulder and low back. Pt stood from elevated EOB with RW and CGA and ambulated 139ft x 2 trials with RW and CGA to/from dayroom with 1 seated rest break due to fatigue. NexWave rep present and set up device on R shoulder with TENS and NMES settings; then transitioned to R forearm, wrist, and hand and worked on grip strength with ball. Concluded session with pt sitting in recliner with all needs within reach awaiting upcoming OT session.    Therapy Documentation Precautions:  Precautions Precautions: Fall Precaution Comments: Prior R BKA (has prosthesis); abdomenal incision; capped trach; watch HR Restrictions Weight Bearing Restrictions: No  Therapy/Group: Individual Therapy Marlana Salvage Zaunegger Blima Rich PT, DPT 07/27/2023, 6:52 AM

## 2023-07-27 NOTE — Progress Notes (Signed)
Occupational Therapy Session Note  Patient Details  Name: Russell Garcia MRN: [id number] Date of Birth: 11-19-1955  Today's Date: 07/27/2023 OT Individual Time: 1330-1443 OT Individual Time Calculation (min): 73 min    Short Term Goals: Week 1:  OT Short Term Goal 1 (Week 1): Patient to perform toilet tranfser with Min assist OT Short Term Goal 2 (Week 1): Patient to perform self feeding with adapted silverware with set up assist OT Short Term Goal 3 (Week 1): Patient to perform UB dressing with Min assist OT Short Term Goal 4 (Week 1): Patient to perform bathing with Mod assist using AE as needed  Skilled Therapeutic Interventions/Progress Updates:  Skilled OT intervention completed with focus on RUE NMR, dynamic standing balance, activity tolerance and ambulatory transfers. Pt received seated in recliner with [employer] rep present administering NexWav to RUE. Pt agreeable to session. Intermittent discomfort on RUE with certain movements reported; pre-medicated. OT offered rest breaks and repositioning throughout for pain reduction.  Pt agreeable to come to sink for functional activity with use of NexWave NMES (see parameters utilized below). Min A sit > stand using RW then ambulatory transfer with CGA using RW > sink. Pt able to maintain stance for 2-3 mins during each standing trial, with CGA increasing to heavy min A with fatigue, with noted trembling of BLE and pt unaware. Discussed how  high motivation is great, but pt with need to increase his awareness of his body's fatigue level for safety and fall prevention with mobility at home.  NexWave was first on wrist flexors for producing grasp with pt grabbing objects from sink and transferring them to bucket on Rt side, however pt with decent grasp ability therefore OT suggested switching to shoulder for NMES. Electrodes placed to front deltoid and supraspinatus muscles to produce shoulder flexion and protraction during reaching/retrieving of ball  from cup at various heights. Pt able to achieve about 75 degrees shoulder flexion without pain as compared to < 45 degrees without NMES.   NexWave Parameters -Amplitude- 25 mA -Frequency- 35 Hz -Pulse width- 480 -Mode- 20:10 -On time- 10 sec -Off time- 20 sec -Ramp up- 3 sec -Ramp down- 1 sec -Waveform- symmetrical biphasic -Applied for 20 mins, without adverse reactions after treatment and skin remained intact.   Transported dependently in w/c <> gym for time. Seated at table top, pt completed the following activities to promote functional use of RUE needed for independence with BADLs: -BUE table slides including shoulder flexion, horizontal abduction and RUE shoulder external rotation (x15) -9 hole peg test- 43.07 sec (Lt hand), 59.45 sec (Rt, weaker hand) -building and disassembling peg board; sorting by rows of same color. No cognitive difficulty, however increased time needed for grasping of pegs -issued red theraputty for grasping activity in room in between therapies  Back in room, heavy min A sit > stand with RW and min A ambulatory transfer with RW > recliner with cues needed for stepping back to recliner. Pt remained seated in recliner with BLE elevated, chair alarm on and all needs met at end of session.  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

## 2023-07-28 DIAGNOSIS — W3400XA Accidental discharge from unspecified firearms or gun, initial encounter: Secondary | ICD-10-CM

## 2023-07-28 DIAGNOSIS — S5411XD Injury of median nerve at forearm level, right arm, subsequent encounter: Secondary | ICD-10-CM | POA: Diagnosis not present

## 2023-07-28 MED ORDER — METOPROLOL TARTRATE 50 MG PO TABS
50.0000 mg | ORAL_TABLET | Freq: Four times a day (QID) | ORAL | Status: DC
  Administered 2023-07-28 – 2023-08-03 (×23): 50 mg via ORAL
  Filled 2023-07-28 (×24): qty 1
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
## 2023-07-28 NOTE — Progress Notes (Signed)
Physical Therapy Session Note  Patient Details  Name: Russell Garcia MRN: [id number] Date of Birth: Nov 03, 1955  Today's Date: 07/28/2023 PT Individual Time: 4098-1191 PT Individual Time Calculation (min): 68 min   Today's Date: 07/28/2023 PT Missed Time: 7 Minutes Missed Time Reason: Toileting  Short Term Goals: Week 1:  PT Short Term Goal 1 (Week 1): Pt will perform bed mobility with overall MinA. PT Short Term Goal 2 (Week 1): Pt will perform functional standing transfers with MinA. PT Short Term Goal 3 (Week 1): Pt will ambulate at least 50 ft per bout using LRAD with CGA. PT Short Term Goal 4 (Week 1): Pt will complete at least 1 step using RW with no more than ModA.  Skilled Therapeutic Interventions/Progress Updates:   Received pt sitting in recliner, pt agreeable to PT treatment, and reported pain 4-5/10 in R shoulder and back. Session with emphasis on functional mobility/transfers, bathing, dressing, generalized strengthening and endurance, dynamic standing balance/coordination, simulated car transfers, and gait training. Pt requested to get washed up - stood from recliner with RW and mod A and ambulated to sink. Doffed pants with max A and shirt with mod A and pt washed face and anterior upper/lower body with supervision - required total A to wash back and under armpits. Stood from [facility] with RW and mod A and required total A to wash lower body in standing. Returned to sitting and donned pull over shirt with mod A and pants with max A. Stood from [facility] again with mod A and required total A to pull pants over hips. Pt sat in WC at sink and brushed teeth with set up assist and required assist to apply deodorant.   Discussed equipment for D/C and recommendation for [facility] for energy conservation purposes and for navigating longer distances. Pt transported to/from room in [facility] dependently for time management purposes. Pt performed simulated car transfer via stand<>pivot with RW and min A with increased  time/effort to get LEs in/out of car and cues for technique. Pt then ambulated 84ft on uneven surfaces (ramp) with RW and min A - cues for eccentric control when descending ramp. Stood from [facility] with RW and heavy min A and ambulated additional 7ft with RW and CGA to fatigue - pt reporting 8/10 fatigue afterwards. Returned to room, stood from [facility] with RW and heavy min A, and ambulated back to recliner. Of note, pt requires cues for technique when standing (specifically to scoot to edge of chair and for anterior weight shifting). Pt reported urge to use restroom and concluded session with pt sitting in recliner left in care of NT to assist with toileting. 7 minutes missed of skilled physical therapy.    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

## 2023-07-28 NOTE — Progress Notes (Signed)
Occupational Therapy Session Note  Patient Details  Name: Russell Garcia MRN: [id number] Date of Birth: [birth year]  Today's Date: 07/28/2023 OT Individual Time: 4098-1191 OT Individual Time Calculation (min): 70 min    Short Term Goals: Week 1:  OT Short Term Goal 1 (Week 1): Patient to perform toilet tranfser with Min assist OT Short Term Goal 2 (Week 1): Patient to perform self feeding with adapted silverware with set up assist OT Short Term Goal 3 (Week 1): Patient to perform UB dressing with Min assist OT Short Term Goal 4 (Week 1): Patient to perform bathing with Mod assist using AE as needed  Skilled Therapeutic Interventions/Progress Updates:  Skilled OT intervention completed with focus on emotional well being, RUE NMR, cognition, and functional ambulation. Pt received seated in recliner, agreeable to session. Discomfort reported in RUE; pre-medicated. OT offered rest breaks and repositioning throughout for pain reduction.  Pt declined self care needs at current time. Agreeable to go outside to boost overall mood and well being as per pt, "I haven't smelled fresh air or felt the sun in over 3 months since I've been in the hospital." Min A sit > stand with RW then CGA ambulatory transfer with RW > w/c outside room door. Per pt approval and with nurse notified, OT transported pt dependently in w/c <> hospital courtyard. Sunglasses provided for light sensitivity.   OT applied sabeo stim to Rt anterior/posterior deltoid to promote joint approximation, reduce pain and improve functional use of Rt shoulder during the following activities (see parameters below): -using Rt hand to complete sliding pegboard design x2. No difficulty with task cognitively but did require cues to use Rt hand predominantly for NMR benefit including fine motor, and scapular protraction -playing game of UNO against OT. Pt was able to recall the familiar game and instruct OT in the rules. Utilized BUE, but predominantly  RUE during shuffling, sorting and playing of cards. Did need min A intermittently to follow directions of the game, however was able to play at supervision level.  Wheeled pt around fountain, and valet parking area, with pt engaging in conversation about his disappointments regarding he is at functionally. Encouragement provided, and education provided about follow up therapies that can continue to improve pt's progress but anticipate OP will be only option due to admitting diagnosis. Pt reports his wife is able to come for education but has not been told about opportunity to attend. Will notify CSW for request.  Back in room, pt required heavy min A from w/c to stand using RW then CGA ambulatory transfer with RW > recliner. Cues needed for stepping back. Pt remained seated in recliner, with chair alarm on/activated, and with all needs in reach at end of session.  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

## 2023-07-28 NOTE — Progress Notes (Signed)
Speech Language Pathology Daily Session Note  Patient Details  Name: Russell Garcia MRN: [id number] Date of Birth: Jul 19, 1956  Today's Date: 07/28/2023 SLP Individual Time: 1000-1100 SLP Individual Time Calculation (min): 60 min  Short Term Goals: Week 1: SLP Short Term Goal 1 (Week 1): Pt will recall safe swallow strategies and aspiration precautions related to his PMV with 80% acc when provided min A. SLP Short Term Goal 2 (Week 1): Pt will recall long-term memories using external memory aids with 80% acc when provided min A. SLP Short Term Goal 3 (Week 1): Pt will recall new information in the current environment with 80% acc when provided min A.  Skilled Therapeutic Interventions: SLP conducted skilled therapy session targeting dysphagia management and cognitive retraining goals. SLP greeted patient and assisted with transfer to wheelchair for transit to speech therapy office. SLP assessed patient's ongoing diet tolerance now that trach has been removed, with patient consuming thin liquids and regular solids with no overt difficulty nor clinical s/sx of penetration/aspiration throughout. Recommend continuation of current Dys3/thin liquid diet per patient preference, though appropriate for upgrade to regular at patient's discretion. No further acute dysphagia needs indicated at this time, as patient has met all functional swallowing goals. During cognitive tasks, SLP targeting working memory with patient benefiting from supervision verbal cues during basic working memory task and [name] cues for mildly complex working memory task. Patient assisted back to room and into recliner per preference with chair alarm set and nursing in room, all needs in reach. SLP will continue with plan of care as indicated.      Pain Pain Assessment Pain Scale: 0-10 Pain Score: 7  Pain Location: Knee (Only when walking)  Therapy/Group: Individual Therapy  [name], M.A., CCC-SLP  [name] 07/28/2023, 12:46 PM

## 2023-07-29 DIAGNOSIS — F4311 Post-traumatic stress disorder, acute: Secondary | ICD-10-CM | POA: Diagnosis not present

## 2023-07-29 DIAGNOSIS — W3400XA Accidental discharge from unspecified firearms or gun, initial encounter: Secondary | ICD-10-CM

## 2023-07-29 DIAGNOSIS — S5411XD Injury of median nerve at forearm level, right arm, subsequent encounter: Secondary | ICD-10-CM | POA: Diagnosis not present

## 2023-07-29 MED ORDER — VITAMIN D 25 MCG (1000 UNIT) PO TABS
3000.0000 [IU] | ORAL_TABLET | Freq: Every day | ORAL | Status: DC
  Administered 2023-07-30 – 2023-08-01 (×3): 3000 [IU] via ORAL
  Filled 2023-07-29 (×3): qty 3
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
## 2023-07-29 NOTE — Progress Notes (Signed)
Occupational Therapy Session Note  Patient Details  Name: Russell Garcia MRN: [id number] Date of Birth: 09/17/1956  Today's Date: 07/29/2023 OT Individual Time: 0935-1030 OT Individual Time Calculation (min): 55 min    Short Term Goals: Week 1:  OT Short Term Goal 1 (Week 1): Patient to perform toilet tranfser with Min assist OT Short Term Goal 2 (Week 1): Patient to perform self feeding with adapted silverware with set up assist OT Short Term Goal 3 (Week 1): Patient to perform UB dressing with Min assist OT Short Term Goal 4 (Week 1): Patient to perform bathing with Mod assist using AE as needed  Skilled Therapeutic Interventions/Progress Updates:  Skilled OT intervention completed with focus on ADL retraining, ambulatory transfers, pain management. Pt received seated in recliner using suction to cough phlegm up, agreeable to session. Unrated pain reported in Rt shoulder; pre-medicated. OT offered rest breaks, repositioning and e-stim for pain reduction.  Pt noted to be more lethargic this AM, reporting he is unsure if they gave him something new for pain as he feels more groggy. Needed increased cues for initiation and attending to task throughout. He also reports he had long conversation with neuro psych and it got him thinking; emotional support offered.  CGA sit > stand using RW then CGA/min A ambulatory transfer with RW > BSC over toilet, with cues needed for BLE clearance over bathroom threshold. Min A to doff pants down. Pt requested assist to use urinal on toilet for urinary void due to "weak grasp" however pt with functional grip though difficult. Despite gentle encouragement to do on his own in prep for DC, pt politely declined stating "I'll have help for that at home." Supervision sit > stand with RW then CGA to donn pants over hips, ambulated with CGA to w/c at sink.   Cues needed to doff shirt from behind head but overall supervision. Washed face with set up A. Min/mod cues for  sequencing, and overall min A to donn shirt over head due to tight fit.   Pt with consistent complaint about Rt shoulder pain, and pt requested to use saebo as he felt it helped yesterday.   Saebo Stim One  Applied for 60 mins (unattended) to the Rt deltoid for NMR and pain management with the following parameters:  330 pulse width 35 Hz pulse rate On 8 sec/ off 8 sec Ramp up/ down 2 sec Symmetrical Biphasic wave form  Max intensity at 500 Ohm load -Removed from pt at end of cycle with no adverse skin reaction or irritation noted.  Pt remained seated in w/c, with chair alarm on/activated, and with all needs in reach at end of session.   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

## 2023-07-29 NOTE — Progress Notes (Signed)
Physical Therapy Session Note  Patient Details  Name: Russell Garcia MRN: [id number] Date of Birth: 1956-09-02  Today's Date: 07/29/2023 PT Individual Time: 1345-1456 PT Individual Time Calculation (min): 71 min   Short Term Goals: Week 1:  PT Short Term Goal 1 (Week 1): Pt will perform bed mobility with overall MinA. PT Short Term Goal 2 (Week 1): Pt will perform functional standing transfers with MinA. PT Short Term Goal 3 (Week 1): Pt will ambulate at least 50 ft per bout using LRAD with CGA. PT Short Term Goal 4 (Week 1): Pt will complete at least 1 step using RW with no more than ModA.  Skilled Therapeutic Interventions/Progress Updates:   Received pt sitting in recliner, pt agreeable to PT treatment, and reported pain was "okay" but reported feeling "slow" today. Session with emphasis on functional mobility/transfers, generalized strengthening and endurance, dynamic standing balance/coordination, and gait training. Stood from recliner with RW and max A and ambulated 42ft with RW and CGA to fatigue. Of note, pt ambulating much slower today > yesterday and fatigued much quicker.   Transported to main therapy gym in [facility] dependently and transferred onto mat via stand<>pivot with RW and mod A. Transitioned into supine on wedge with min A for BLE management and required significantly increased time/effort and multiple scoots/bridges to get positioned in middle of mat. Pt performed the following exercises in supine with emphasis on LE strength/ROM: -SAQ with 4lb ankle weight 2x12 bilaterally -SLR 2x10 bilaterally -bridges 2x8 -hooklying hip abduction with grn TB 2x15 Pt required frequent rest breaks throughout session due to fatigue and with flat affect overall. Pt transferred semi-reclined on wedge<>sitting EOM with heavy mod A/light max A for trunk control. Pt required mod A to scoot hips to EOM. Discussed difficulty with bed mobility and pt reports his bed at his brothers is a high flat bed.  Discussed need to practice bed mobility from flat surfaces or getting hospital bed/bed assist rails; but pt resistive to idea. Donned ted hose to LLE for edema management and stood from EOM With RW and mod A and ambulated additional 170ft with RW and CGA back towards room - limited by fatigue and transported remainder of way. Stood with mod A and ambulated to recliner. Concluded session with pt sitting in recliner, needs within reach, and chair pad alarm on.    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

## 2023-07-29 NOTE — Progress Notes (Signed)
Speech Language Pathology Weekly Progress and Session Note  Patient Details  Name: Russell Garcia MRN: [id number] Date of Birth: [birth year]  Beginning of progress report period: July 22, 2023 End of progress report period: July 29, 2023  Today's Date: 07/29/2023 SLP Individual Time: 1100-1205 SLP Individual Time Calculation (min): 65 min  Short Term Goals: Week 1: SLP Short Term Goal 1 (Week 1): Pt will recall safe swallow strategies and aspiration precautions related to his PMV with 80% acc when provided min A. SLP Short Term Goal 1 - Progress (Week 1): Discontinued (comment) (PMV and trach removed) SLP Short Term Goal 2 (Week 1): Pt will recall long-term memories using external memory aids with 80% acc when provided min A. SLP Short Term Goal 2 - Progress (Week 1): Met SLP Short Term Goal 3 (Week 1): Pt will recall new information in the current environment with 80% acc when provided min A. SLP Short Term Goal 3 - Progress (Week 1): Met  New Short Term Goals: Week 2: SLP Short Term Goal 1 (Week 2): STGs=LTGs d/t ELOS  Weekly Progress Updates: Patient is making excellent gains towards therapy goals, meeting 2/2 short term goals set this reporting period. Re: swallowing/dysphagia, patient tolerating Dys3/thin liquid diet (per preference) with no overt s/sx of penetration/aspiration and no overt difficulty. During this reporting period, patient had trach removed, thus no longer needs to follow PMV-related swallow safety recommendations. Goal discontinued. SLP advised patient that he is able to upgrade to regular diet as preferred, however patient prefers to stay on Dys3 at this time. Cognitively, patient is making improvements, at an overall minA level for memory and working memory during mildly complex tasks. Patient and family education ongoing. Patient will continue to benefit from skilled therapy services during remainder of CIR stay.    Intensity: Minumum of 1-2 x/day, 30 to 90  minutes Frequency: 3 to 5 out of 7 days Duration/Length of Stay: 10/9 Treatment/Interventions: Functional tasks;Internal/external aids;Medication managment;Dysphagia/aspiration precaution training;Cognitive remediation/compensation;Therapeutic Activities;Therapeutic Exercise;Patient/family education  Daily Session  Skilled Therapeutic Interventions: SLP conducted skilled therapy session targeting cognitive retraining goals. SLP facilitated medication organization task with patient's medications with patient benefiting from supervision-minA to organize accurately. During functional working memory task, patient benefited from minA to recall mildly complex task parameters. Throughout session, patient recalled new information in the current environment given minA from SLP and recalled long term memories with supervision. Patient was left in chair with call bell in reach and chair alarm set. SLP will continue to target goals per plan of care.       Pain Pain Assessment Pain Scale: 0-10 Pain Score: 0-No pain  Therapy/Group: Individual Therapy  [name], M.A., CCC-SLP   [name] 07/29/2023, 12:52 PM

## 2023-07-29 NOTE — Progress Notes (Signed)
Patient ID: Russell Garcia, male   DOB: 1956/01/11, 67 y.o.   MRN: [id number]   Wheelchair ordered through [employer].

## 2023-07-29 NOTE — Progress Notes (Signed)
Patient ID: Russell Garcia, male   DOB: 21-Jul-1956, 67 y.o.   MRN: [id number]   9:32 AM: Sw left VM for pt brother, Casimiro Needle to arrange family education. SW will wait for FU.  9:36: Family education arranged 10/7 10 AM-12 PM.

## 2023-07-30 ENCOUNTER — Inpatient Hospital Stay (HOSPITAL_COMMUNITY): Payer: Medicare HMO

## 2023-07-30 DIAGNOSIS — I10 Essential (primary) hypertension: Secondary | ICD-10-CM

## 2023-07-30 DIAGNOSIS — W3400XA Accidental discharge from unspecified firearms or gun, initial encounter: Secondary | ICD-10-CM | POA: Diagnosis not present

## 2023-07-30 LAB — CBC
HCT: 31.3 % — ABNORMAL LOW (ref 39.0–52.0)
Hemoglobin: 9.8 g/dL — ABNORMAL LOW (ref 13.0–17.0)
MCH: 25.7 pg — ABNORMAL LOW (ref 26.0–34.0)
MCHC: 31.3 g/dL (ref 30.0–36.0)
MCV: 82.2 fL (ref 80.0–100.0)
Platelets: 150 10*3/uL (ref 150–400)
RBC: 3.81 MIL/uL — ABNORMAL LOW (ref 4.22–5.81)
RDW: 17 % — ABNORMAL HIGH (ref 11.5–15.5)
WBC: 12.4 10*3/uL — ABNORMAL HIGH (ref 4.0–10.5)
nRBC: 0.2 % (ref 0.0–0.2)

## 2023-07-30 LAB — URINALYSIS, W/ REFLEX TO CULTURE (INFECTION SUSPECTED)
Bacteria, UA: NONE SEEN
Bilirubin Urine: NEGATIVE
Glucose, UA: NEGATIVE mg/dL
Ketones, ur: NEGATIVE mg/dL
Leukocytes,Ua: NEGATIVE
Nitrite: NEGATIVE
Protein, ur: NEGATIVE mg/dL
Specific Gravity, Urine: 1.014 (ref 1.005–1.030)
pH: 5 (ref 5.0–8.0)

## 2023-07-30 MED ORDER — SODIUM CHLORIDE 0.9 % IV SOLN
2.0000 g | Freq: Three times a day (TID) | INTRAVENOUS | Status: DC
  Administered 2023-07-30 – 2023-08-02 (×9): 2 g via INTRAVENOUS
  Filled 2023-07-30 (×9): qty 12.5

## 2023-07-30 NOTE — Progress Notes (Signed)
Speech Language Pathology Daily Session Note  Patient Details  Name: MICHAIL BOYTE MRN: [id number] Date of Birth: 1955/12/09  Today's Date: 07/30/2023 SLP Individual Time: 1430-1500 SLP Individual Time Calculation (min): 30 min  Short Term Goals: Week 2: SLP Short Term Goal 1 (Week 2): STGs=LTGs d/t ELOS  Skilled Therapeutic Interventions: Skilled therapy session focused on cognitive goals. Upon entrance, patient oriented x4, though reports feeling fatigued and "woozy." RN notified and reports patient with increased WBC. Patient verbalized willingness to participate in ST.   SLP facilitated session by providing supervision A aiding in recall of memory strategies. Patient able to identify use for each strategy with supervision A. SLP initiated use of memory book and patient verbalized days events independently. Upon completion of today's memory log, transport entered room to take patient to x-ray. Remainder of session was d/c due to x-ray. Patient left with transport personnel, in bed with alarm set and call bell in reach. Continue POC.   Pain Patient reports feeling "woozy" and tired. Nursing aware.   Therapy/Group: Individual Therapy  Swati Granberry M.A., CF-SLP 07/30/2023, 3:01 PM

## 2023-07-30 NOTE — Progress Notes (Signed)
Pt developed fever and tachypnea (24) with increased WBC's (12.4). Has had persistent cough. CXR isn't overly impressive, radiology reading right lower lobe atelectasis which is apparent. Given his history, will treat this aggressively.   Begin cefepmie 2g q8 IV Repeat CXR and CBC in AM Treat supportively. Pt is comfortable for now.  Follow up in AM    Ranelle Oyster, MD, [facility] Physical Medicine & Rehabilitation Medical Director Rehabilitation Services 07/30/2023

## 2023-07-30 NOTE — Progress Notes (Signed)
Physical Therapy Session Note  Patient Details  Name: Russell Garcia MRN: [id number] Date of Birth: 05-21-1956  Today's Date: 07/30/2023 PT Individual Time: 1045-1155 PT Individual Time Calculation (min): 70 min   Short Term Goals: Week 1:  PT Short Term Goal 1 (Week 1): Pt will perform bed mobility with overall MinA. PT Short Term Goal 2 (Week 1): Pt will perform functional standing transfers with MinA. PT Short Term Goal 3 (Week 1): Pt will ambulate at least 50 ft per bout using LRAD with CGA. PT Short Term Goal 4 (Week 1): Pt will complete at least 1 step using RW with no more than ModA.  Skilled Therapeutic Interventions/Progress Updates:   Received pt sitting in recliner. Pt reported pain was "okay" but c/o feeling "woozy" despite BP being WNL. Pt also lethargic this morning, feeling "slower", and reported falling in/out of sleep while sitting up this morning.   Provided pt with HEP and educated on frequency/duration/technique for the following exercises: - Supine Bridge  - 1 x daily - 7 x weekly - 3 sets - 10 reps - Supine Active Straight Leg Raise  - 1 x daily - 7 x weekly - 3 sets - 10 reps - Clamshell  - 1 x daily - 7 x weekly - 3 sets - 10 reps - Hooklying Clamshell with Resistance  - 1 x daily - 7 x weekly - 3 sets - 10 reps - Supine Hip Adduction Isometric with Ball  - 1 x daily - 7 x weekly - 3 sets - 10 reps - 5 hold - Seated Long Arc Quad  - 1 x daily - 7 x weekly - 3 sets - 10 reps - Seated March  - 1 x daily - 7 x weekly - 3 sets - 10 reps - Standing March with Counter Support  - 1 x daily - 7 x weekly - 3 sets - 10 reps - Standing Hip Abduction with Counter Support  - 1 x daily - 7 x weekly - 3 sets - 10 reps  Pt requested to clean prosthetic - stood from recliner with RW and max A and noted new L lateral lean in standing and EXTREMELY slow to transfer into recliner, requiring up to min A for balance. Pt presenting differently than normal with slower processing and  increased lethargy, keeping eyes closed during majority of session. Pt reported having fever last night and could be contributing to symptoms. Transferred into supine with min A for BLE management and removed pants with total A via bridging. RN called to bedside to perform assessment and vitals obtained - BP: 118/68, HR 94bpm, and temp 98.1 - PA aware. RN bladder scanned pt in case of potential UTI.     Removed prosthetic with total A and therapist cleaned liner while pt began washing residual limb but required assist from therapist to ensure full cleanliness. Of note, continued odor coming from liner but pt insists on sleeping with it on. Donned clean liner and grn sock dependently and increased time spent adjusting prosthetic as brown top liner got stuck inside socket (ultimately requiring +2 assist to fix). Donned pants in supine with max A and transferred semi-reclined<>sitting EOB with HOB elevated and use of bedrails with heavy mod A. Stood from elevated EOB with RW and min A and required assist to pull pants over hips. Pt requested to put on socks and shoes - significantly increased time spent attempting to get socks and shoes on, but ultimately L foot too  swollen and socks not fitting properly on prosthetic leg. Transferred into recliner with RW and CGA and donned ted hose to LLE for edema management. Concluded session with pt sitting in recliner, needs within reach, and chair pad alarm on.   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

## 2023-07-30 NOTE — Progress Notes (Signed)
Ordered CBC resulted significant for WBC elevated to 12.4 Md notified

## 2023-07-30 NOTE — Progress Notes (Signed)
New onset of fever to 100.7  and increased BP to 127/91 at noon vital sign check. Vital signs verified by nurse and Md called to report new fever last WBC count 7.1 patient states " I am extra sleepy today, and I feel kind of woozy." Md notified of fever. No new orders at this time call light in reach current plan of care in progress

## 2023-07-30 NOTE — Plan of Care (Signed)
  Problem: RH Balance Goal: LTG Patient will maintain dynamic standing balance (PT) Description: LTG:  Patient will maintain dynamic standing balance with assistance during mobility activities (PT) Flowsheets (Taken 07/30/2023 0719) LTG: Pt will maintain dynamic standing balance during mobility activities with:: (downgraded due to slow processing, weakness, and fatigue) Supervision/Verbal cueing Note: downgraded due to slow processing, weakness, and fatigue    Problem: Sit to Stand Goal: LTG:  Patient will perform sit to stand with assistance level (PT) Description: LTG:  Patient will perform sit to stand with assistance level (PT) Flowsheets (Taken 07/30/2023 0719) LTG: PT will perform sit to stand in preparation for functional mobility with assistance level: (downgraded due to weakness and fatigue) Minimal Assistance - Patient > 75% Note: downgraded due to weakness and fatigue    Problem: RH Bed Mobility Goal: LTG Patient will perform bed mobility with assist (PT) Description: LTG: Patient will perform bed mobility with assistance, with/without cues (PT). Flowsheets (Taken 07/30/2023 0719) LTG: Pt will perform bed mobility with assistance level of: (downgraded due to weakness, pain, and fatigue) Minimal Assistance - Patient > 75% Note: downgraded due to weakness, pain, and fatigue    Problem: RH Ambulation Goal: LTG Patient will ambulate in controlled environment (PT) Description: LTG: Patient will ambulate in a controlled environment, # of feet with assistance (PT). Flowsheets (Taken 07/30/2023 0719) LTG: Pt will ambulate in controlled environ  assist needed:: (downgraded due to weakness, pain, and fatigue) Supervision/Verbal cueing LTG: Ambulation distance in controlled environment: 175ft with LRAD Note: downgraded due to weakness, pain, and fatigue  Goal: LTG Patient will ambulate in home environment (PT) Description: LTG: Patient will ambulate in home environment, # of feet with  assistance (PT). Flowsheets (Taken 07/30/2023 0719) LTG: Pt will ambulate in home environ  assist needed:: (downgraded due to weakness, pain, and fatigue) Supervision/Verbal cueing LTG: Ambulation distance in home environment: 60ft with LRAD Note: downgraded due to weakness, pain, and fatigue    Problem: RH Stairs Goal: LTG Patient will ambulate up and down stairs w/assist (PT) Description: LTG: Patient will ambulate up and down # of stairs with assistance (PT) Flowsheets (Taken 07/30/2023 0719) LTG: Pt will ambulate up/down stairs assist needed:: (downgraded due to weakness, pain, and fatigue) Contact Guard/Touching assist LTG: Pt will  ambulate up and down number of stairs: 1 threshold step with LRAD to enter/exit home Note: downgraded due to weakness, pain, and fatigue
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
## 2023-07-31 ENCOUNTER — Inpatient Hospital Stay (HOSPITAL_COMMUNITY): Payer: Medicare HMO

## 2023-07-31 DIAGNOSIS — R051 Acute cough: Secondary | ICD-10-CM

## 2023-07-31 DIAGNOSIS — I10 Essential (primary) hypertension: Secondary | ICD-10-CM | POA: Diagnosis not present

## 2023-07-31 DIAGNOSIS — W3400XA Accidental discharge from unspecified firearms or gun, initial encounter: Secondary | ICD-10-CM | POA: Diagnosis not present

## 2023-07-31 LAB — CBC
HCT: 34.2 % — ABNORMAL LOW (ref 39.0–52.0)
Hemoglobin: 10.5 g/dL — ABNORMAL LOW (ref 13.0–17.0)
MCH: 25.7 pg — ABNORMAL LOW (ref 26.0–34.0)
MCHC: 30.7 g/dL (ref 30.0–36.0)
MCV: 83.6 fL (ref 80.0–100.0)
Platelets: 169 10*3/uL (ref 150–400)
RBC: 4.09 MIL/uL — ABNORMAL LOW (ref 4.22–5.81)
RDW: 16.9 % — ABNORMAL HIGH (ref 11.5–15.5)
WBC: 4.9 10*3/uL (ref 4.0–10.5)
nRBC: 0 % (ref 0.0–0.2)

## 2023-07-31 NOTE — Progress Notes (Signed)
Physical Therapy Session Note  Patient Details  Name: Russell Garcia MRN: [id number] Date of Birth: 12-Dec-1955  Today's Date: 07/31/2023 PT Individual Time: 1446-1530 PT Individual Time Calculation (min): 44 min   Short Term Goals: Week 1:  PT Short Term Goal 1 (Week 1): Pt will perform bed mobility with overall MinA. PT Short Term Goal 2 (Week 1): Pt will perform functional standing transfers with MinA. PT Short Term Goal 3 (Week 1): Pt will ambulate at least 50 ft per bout using LRAD with CGA. PT Short Term Goal 4 (Week 1): Pt will complete at least 1 step using RW with no more than ModA.  Skilled Therapeutic Interventions/Progress Updates: Pt presents sitting in w/c and agreeable to therapy.  Pt transfers sit to stand w/ CGA throughout session.  Pt amb x 3 up to 90' including turns to return to seat, w/ CGA.  Pt performed standing reaching and throwing beanbags w/ B UES and w/o LOB.  Pt does state reliance on L LE when standing.  Pt returned to room and stood for use of urinal, continent of 250 ml unable to hold urinal, but steadying only for clothing management.  Pt returned to recliner w/ seat alarm on and all needs in reach.      Therapy Documentation Precautions:  Precautions Precautions: Fall Precaution Comments: Prior R BKA (has prosthesis); abdomenal incision; capped trach; watch HR Restrictions Weight Bearing Restrictions: No General:   Vital Signs:   Pain:7/10 in waist w/ forward lean, but otherwise 0/10.      Therapy/Group: Individual Therapy  [name] 07/31/2023, 4:15 PM

## 2023-07-31 NOTE — Progress Notes (Signed)
Occupational Therapy Session Note  Patient Details  Name: Russell Garcia MRN: [id number] Date of Birth: 03-Mar-1956  Today's Date: 07/31/2023 OT Individual Time: 1350-1430 OT Individual Time Calculation (min): 40 min    Short Term Goals: Week 1:  OT Short Term Goal 1 (Week 1): Patient to perform toilet tranfser with Min assist OT Short Term Goal 2 (Week 1): Patient to perform self feeding with adapted silverware with set up assist OT Short Term Goal 3 (Week 1): Patient to perform UB dressing with Min assist OT Short Term Goal 4 (Week 1): Patient to perform bathing with Mod assist using AE as needed  Skilled Therapeutic Interventions/Progress Updates:  Pt received resting in bed for skilled OT session with focus on functional transfers and standing balance/tolerance. Pt agreeable to interventions, demonstrating overall pleasant mood. Pt with un-rated pain at new IV site. OT offering intermediate rest breaks and positioning suggestions throughout session to address pain/fatigue and maximize participation/safety in session.   Pt comes to EOB with Mod A for trunk elevation + cuing for technique. Pt requesting to increase bed height to better mirror home environment, performing initial STS with Min A + RW. Pt takes ~5 steps towards WC, CGA + RW, requiring increased assistance to slow descent onto WC.   Pt dependent for WC transport from room<>day room for time/energy conservation. In day room, pt participates in table-top activity at high-low table, targeting standing balance/tolerance, pt stands for ~3 mins with CGA + unilateral support on table edge. Pt performs STS with CGA-Min A + no AD, placing hands on edge of table upon reaching full standing position. Session transitioned to UE strengthening, as patient was tasked with 3x8 reps of WC push-ups. HR stable throughout session.   Pt remained sitting in [facility] with all immediate needs met at end of session. Pt continues to be appropriate for skilled OT  intervention to promote further functional independence.   Therapy Documentation Precautions:  Precautions Precautions: Fall Precaution Comments: Prior R BKA (has prosthesis); abdomenal incision; capped trach; watch HR Restrictions Weight Bearing Restrictions: No   Therapy/Group: Individual Therapy  Lou Cal, OTR/L, MSOT  07/31/2023, 6:59 AM

## 2023-07-31 NOTE — Progress Notes (Signed)
Occupational Therapy Session Note  Patient Details  Name: GAIL CREEKMORE MRN: [id number] Date of Birth: [birth year]  Today's Date: 07/31/2023 OT Individual Time: 1015-1100 OT Individual Time Calculation (min): 45 min    Short Term Goals: Week 3:     Skilled Therapeutic Interventions/Progress Updates:    Pt resting in recliner upon arrival with wife present. OT intervention with focus on DME and discharge planning. Pt has walk in shower and wife reports she is planning on purchasing a chair. Pt educated on use of suction grab bars. Recommended installed grab bars if possible. BUE AROM and PROM in addition to grasp/release and hand strengthening. Pt issued foam blocks to continue strengthening grasp. Pt remained in recliner with seat alarm activated. Wife present and all needs within reach.   Therapy Documentation Precautions:  Precautions Precautions: Fall Precaution Comments: Prior R BKA (has prosthesis); abdomenal incision; capped trach; watch HR Restrictions Weight Bearing Restrictions: No    Pain:  Pt c/o bil shoulder pain (unrated); PROM    Therapy/Group: Individual Therapy  Rich Brave 07/31/2023, 12:12 PM
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
## 2023-08-01 ENCOUNTER — Inpatient Hospital Stay (HOSPITAL_COMMUNITY): Payer: Medicare HMO

## 2023-08-01 DIAGNOSIS — W3400XA Accidental discharge from unspecified firearms or gun, initial encounter: Secondary | ICD-10-CM | POA: Diagnosis not present

## 2023-08-01 DIAGNOSIS — S5411XD Injury of median nerve at forearm level, right arm, subsequent encounter: Secondary | ICD-10-CM | POA: Diagnosis not present

## 2023-08-01 LAB — BASIC METABOLIC PANEL
Anion gap: 11 (ref 5–15)
BUN: 14 mg/dL (ref 8–23)
CO2: 24 mmol/L (ref 22–32)
Calcium: 8.9 mg/dL (ref 8.9–10.3)
Chloride: 98 mmol/L (ref 98–111)
Creatinine, Ser: 1.34 mg/dL — ABNORMAL HIGH (ref 0.61–1.24)
GFR, Estimated: 58 mL/min — ABNORMAL LOW (ref >60)
Glucose, Bld: 116 mg/dL — ABNORMAL HIGH (ref 70–99)
Potassium: 3.9 mmol/L (ref 3.5–5.1)
Sodium: 133 mmol/L — ABNORMAL LOW (ref 135–145)

## 2023-08-01 LAB — CBC
HCT: 34.2 % — ABNORMAL LOW (ref 39.0–52.0)
Hemoglobin: 10.7 g/dL — ABNORMAL LOW (ref 13.0–17.0)
MCH: 25.9 pg — ABNORMAL LOW (ref 26.0–34.0)
MCHC: 31.3 g/dL (ref 30.0–36.0)
MCV: 82.8 fL (ref 80.0–100.0)
Platelets: 162 10*3/uL (ref 150–400)
RBC: 4.13 MIL/uL — ABNORMAL LOW (ref 4.22–5.81)
RDW: 16.5 % — ABNORMAL HIGH (ref 11.5–15.5)
WBC: 5.1 10*3/uL (ref 4.0–10.5)
nRBC: 0 % (ref 0.0–0.2)

## 2023-08-01 MED ORDER — VITAMIN D 25 MCG (1000 UNIT) PO TABS
4000.0000 [IU] | ORAL_TABLET | Freq: Every day | ORAL | Status: DC
  Administered 2023-08-02 – 2023-08-03 (×2): 4000 [IU] via ORAL
  Filled 2023-08-01 (×2): qty 4

## 2023-08-01 NOTE — Progress Notes (Signed)
Occupational Therapy Session Note  Patient Details  Name: Russell Garcia MRN: [id number] Date of Birth: 17-Oct-1956  Today's Date: 08/01/2023 OT Individual Time: 0800-0830 & 1005-1045 OT Individual Time Calculation (min): 30 min & 40 min    Short Term Goals: Week 1:  OT Short Term Goal 1 (Week 1): Patient to perform toilet tranfser with Min assist OT Short Term Goal 2 (Week 1): Patient to perform self feeding with adapted silverware with set up assist OT Short Term Goal 3 (Week 1): Patient to perform UB dressing with Min assist OT Short Term Goal 4 (Week 1): Patient to perform bathing with Mod assist using AE as needed  Skilled Therapeutic Interventions/Progress Updates:  Session 1 Skilled OT intervention completed with focus on ADL retraining and AE education. Pt received seated in recliner, agreeable to session. 3/10 pain reported in Lt hand from IV site; pre-medicated. OT offered rest breaks, repositioning throughout for pain reduction.  Pt requested to get ready for day. Completed light min A sit > stand from recliner using RW then ambulated with close supervision using RW to w/c at sink. Instructed pt to bathe parts as independently as possible before OT assist to gauge current assist level. Pt required increased time for delayed processing and initiation but able to wash UB with supervision using LH sponge to reach under arms due to lack of BUE AROM.  Education provided on LH sponge for washing BLE, reacher for threading clothing over BLE. Pt able to return demo supervision for LB bathing, min A for LB dressing with pants and total A for socks for time, using associated AE with max cues for sequencing and technique. Able to stand with light min A from w/c using RW, donn pants over hips with CGA for balance, and ambulated back to recliner with CGA using RW.  Pt remained seated in recliner with BLE elevated, with chair alarm on/activated, and with all needs in reach at end of  session.  Session 2 Skilled OT intervention completed with focus on functional transfers, BUE AA/P/ROM. Pt received seated in recliner, agreeable to session. No pain reported.  Pt's brother not present for family ed. Pt called brother and left a message in case he arrives so therapy can continue with therapy.   Completed supervision sit > stand with RW and ambulated with supervision > w/c at bathroom door. Pt declined self-care needs at current time. Transported dependently in w/c > gym for time.  Seated at table top, pt completed the following activities to promote functional use of BUE needed for independence with BADLs: -PROM shoulder flexion bilaterally with restrictions noted at about 90 degrees -BUE table slides including shoulder flexion, unilateral horizontal abduction and unilateral shoulder external rotation (x15, each arm) -AAROM with 2 lb dowel for shoulder flexion 2x5  Back in room, pt remained seated in w/c awaiting upcoming SLP session, with chair alarm on/activated, and with all needs in reach at end of session.   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

## 2023-08-01 NOTE — Progress Notes (Signed)
Speech Language Pathology Daily Session Note  Patient Details  Name: Russell Garcia MRN: [id number] Date of Birth: 1956/03/12  Today's Date: 08/01/2023 SLP Individual Time: 9629-5284 SLP Individual Time Calculation (min): 30 min  Short Term Goals: Week 2: SLP Short Term Goal 1 (Week 2): STGs=LTGs d/t ELOS  Skilled Therapeutic Interventions: SLP conducted skilled therapy session targeting cognitive retraining goals. Patient received in wheelchair directly after OT session. Anticipating family education session, though family not present throughout. Patient transferred to speech therapy office and led through cognitive task targeting working memory with patient benefiting from supervision-minA to recall answers already provided and to recall task parameters. SLP and patient discussed upcoming discharge with patient expressing excitement and anticipation for returning home. Patient indicates awareness of anticipated deficits and verbalizes understanding to information provided re: need for assistance upon discharge. Patient returned to room and left in wheelchair, ready for next session with PT. SLP will continue with plan of care as indicated.     Pain Pain Assessment Pain Score: 4  Pain Location: Generalized  Therapy/Group: Individual Therapy  [name], M.A., CCC-SLP  [name] 08/01/2023, 12:47 PM

## 2023-08-01 NOTE — Progress Notes (Signed)
Patient ID: Russell Garcia, male   DOB: 10/23/1956, 67 y.o.   MRN: [id number]  WC with ELR ordered through [employer] on 10/4. HH approval with [facility] for SN/PT /OT.
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
## 2023-08-01 NOTE — Progress Notes (Signed)
Occupational Therapy Weekly Progress Note  Patient Details  Name: MURRIEL EIDEM MRN: [id number] Date of Birth: 07-15-1956  Beginning of progress report period: July 23, 2023 End of progress report period: August 01, 2023  Patient has met 4 of 4 short term goals. Pt is making gradual progress towards LTGs. He is able to bathe at an overall min A level, dress with mod A and requires min assist for toileting tasks. Pt continues to demonstrate difficulty with BUE AROM, high pain levels, low endurance, gross/fine motor coordination deficits and delayed processing/initiation without cues resulting in difficulty completing BADL tasks without increased physical assist. He has improved his functional mobility, but still needs assist to power up from low surfaces though from elevated surfaces has stood with supervision. Functional grasp also improving as well as cognition including memory, awareness and problem solving. Pt will benefit from continued skilled OT services to focus on mentioned deficits. Family ed not initiated as of date/time.  Patient continues to demonstrate the following deficits: muscle weakness, decreased cardiorespiratoy endurance, decreased coordination, decreased initiation and delayed processing, and decreased standing balance and therefore will continue to benefit from skilled OT intervention to enhance overall performance with BADL, iADL, and Reduce care partner burden.  Patient progressing toward long term goals..  Continue plan of care.  OT Short Term Goals Week 1:  OT Short Term Goal 1 (Week 1): Patient to perform toilet tranfser with Min assist OT Short Term Goal 1 - Progress (Week 1): Met OT Short Term Goal 2 (Week 1): Patient to perform self feeding with adapted silverware with set up assist OT Short Term Goal 2 - Progress (Week 1): Met OT Short Term Goal 3 (Week 1): Patient to perform UB dressing with Min assist OT Short Term Goal 3 - Progress (Week 1): Met OT Short  Term Goal 4 (Week 1): Patient to perform bathing with Mod assist using AE as needed OT Short Term Goal 4 - Progress (Week 1): Met Week 2:  OT Short Term Goal 1 (Week 2): STG = LTG due to ELOS   Maripat Borba E Phillippa Straub, MS, OTR/L  08/01/2023, 7:50 AM

## 2023-08-01 NOTE — Progress Notes (Signed)
Physical Therapy Session Note  Patient Details  Name: Russell Garcia MRN: [id number] Date of Birth: [birth year]  Today's Date: 08/01/2023 PT Individual Time: 4098-1191 and 1401-1455 PT Individual Time Calculation (min): 41 min and 54 min  Short Term Goals: Week 1:  PT Short Term Goal 1 (Week 1): Pt will perform bed mobility with overall MinA. PT Short Term Goal 2 (Week 1): Pt will perform functional standing transfers with MinA. PT Short Term Goal 3 (Week 1): Pt will ambulate at least 50 ft per bout using LRAD with CGA. PT Short Term Goal 4 (Week 1): Pt will complete at least 1 step using RW with no more than ModA.  Skilled Therapeutic Interventions/Progress Updates:   Treatment Session 1 Received pt sitting in [facility]. Pt's brother not present for family education due to getting sick - treatment team aware. Pt agreeable to PT treatment and reported pain in shoulders but that it was "tolerable". Session with emphasis on discharge planning, functional mobility/transfers, generalized strengthening and endurance, gait training, and curb navigation. Pt transported to main therapy gym in [facility] dependently for time management purposes.   Pt stood from [facility] with RW and CGA and navigated 1 5in curb x 2 trials with RW and CGA - pt required cues for RW safety and overall technique. Transported to ortho gym and performed seated BUE strengthening on UBE at level 1 for 2 minutes forward and 3 minutes backwards with emphasis on UE strength and shoulder ROM - pt reported 7/10 difficulty with mild increase in pain. Stood from [facility] with RW and CGA fading to supervision x 2 additional trials and ambulated 149ft x 1 and 151ft x 1 with RW and CGA fading to close supervision back to room. Pt voided in urinal while standing with assist for clothing management and assist to hold urinal. Concluded session with pt sitting in recliner, needs within reach, and chair pad alarm on.   Treatment Session 2 Received pt sitting in recliner, pt  agreeable to PT treatment, and denied any pain during session. Session with emphasis on functional mobility/transfers, generalized strengthening and endurance, dynamic standing balance/coordination, gait training. Went through sensation, MMT, and pain interference questionnaire in preparation for discharge and pt reported feeling ready to D/C Wednesday. Pt stood from recliner with RW and supervision and ambulated 164ft x 2 trials with RW and close supervision to/from dayroom. Worked on dynamic standing balance/coordination, reaction time, and fine motor control dribbling basketball without UE support and CGA for balance for 2 minutes - limited by increased back pain.   Pt requested to lie flat on plinth to stretch back - transitioned into supine with legs hanging off edge with supervision and reported immediate relief. Pt performed 1x10 supine chest press with 4lb dowel increasing to 1x10 with 6.5lb dowel with emphasis on UE strength while stretching back. Then transitioned to supine overhead shoulder flexion with 4lb dowel 2x10 with emphasis on shoulder ROM. Pt able to transition supine<>sitting EOM with CGA/close supervision with increased time/effort but reported back felt "much better" after lying flat. Demonstrated seated trunk flexion to middle and L/R but pt reported pain when stretching to R, therefore stopped activity. Worked on blocked practice sit<>stands from elevated EOM on Airex 2x5 with min A fading to CGA with emphasis on static standing balance. Returned to room and concluded session with pt sitting in recliner, needs within reach, and chair pad alarm on.   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

## 2023-08-01 NOTE — Progress Notes (Signed)
Patient ID: Russell Garcia, male   DOB: 1955-11-21, 67 y.o.   MRN: [id number]  [facility] orders submitted to [facility].

## 2023-08-02 ENCOUNTER — Other Ambulatory Visit (HOSPITAL_COMMUNITY): Payer: Self-pay

## 2023-08-02 DIAGNOSIS — S5411XD Injury of median nerve at forearm level, right arm, subsequent encounter: Principal | ICD-10-CM

## 2023-08-02 DIAGNOSIS — W3400XA Accidental discharge from unspecified firearms or gun, initial encounter: Secondary | ICD-10-CM

## 2023-08-02 LAB — BASIC METABOLIC PANEL
Anion gap: 11 (ref 5–15)
BUN: 12 mg/dL (ref 8–23)
CO2: 25 mmol/L (ref 22–32)
Calcium: 8.7 mg/dL — ABNORMAL LOW (ref 8.9–10.3)
Chloride: 98 mmol/L (ref 98–111)
Creatinine, Ser: 1.02 mg/dL (ref 0.61–1.24)
GFR, Estimated: >60 mL/min (ref >60)
Glucose, Bld: 104 mg/dL — ABNORMAL HIGH (ref 70–99)
Potassium: 3.4 mmol/L — ABNORMAL LOW (ref 3.5–5.1)
Sodium: 134 mmol/L — ABNORMAL LOW (ref 135–145)

## 2023-08-02 MED ORDER — POTASSIUM CHLORIDE 20 MEQ PO PACK
40.0000 meq | PACK | Freq: Once | ORAL | Status: AC
  Administered 2023-08-02: 40 meq via ORAL
  Filled 2023-08-02: qty 2

## 2023-08-02 MED ORDER — SENNA 8.6 MG PO TABS: 2.0000 | ORAL_TABLET | Freq: Every day | ORAL | Status: DC

## 2023-08-02 MED ORDER — ATORVASTATIN CALCIUM 20 MG PO TABS
20.0000 mg | ORAL_TABLET | Freq: Every day | ORAL | 0 refills | Status: DC
  Filled 2023-08-02: qty 30, 30d supply, fill #0

## 2023-08-02 MED ORDER — METOPROLOL TARTRATE 50 MG PO TABS
50.0000 mg | ORAL_TABLET | Freq: Four times a day (QID) | ORAL | 0 refills | Status: DC
  Filled 2023-08-02: qty 120, 30d supply, fill #0

## 2023-08-02 MED ORDER — VALSARTAN-HYDROCHLOROTHIAZIDE 160-25 MG PO TABS
1.0000 | ORAL_TABLET | Freq: Every day | ORAL | 0 refills | Status: DC
  Filled 2023-08-02: qty 30, 30d supply, fill #0

## 2023-08-02 MED ORDER — QUETIAPINE FUMARATE 50 MG PO TABS
50.0000 mg | ORAL_TABLET | Freq: Every day | ORAL | 0 refills | Status: DC
  Filled 2023-08-02: qty 30, 30d supply, fill #0

## 2023-08-02 MED ORDER — METOCLOPRAMIDE HCL 10 MG PO TABS
10.0000 mg | ORAL_TABLET | Freq: Three times a day (TID) | ORAL | 0 refills | Status: DC
  Filled 2023-08-02: qty 90, 23d supply, fill #0

## 2023-08-02 MED ORDER — OXYCODONE HCL 5 MG PO TABS
5.0000 mg | ORAL_TABLET | Freq: Four times a day (QID) | ORAL | 0 refills | Status: DC | PRN
  Filled 2023-08-02: qty 30, 8d supply, fill #0

## 2023-08-02 MED ORDER — TRAZODONE HCL 50 MG PO TABS
50.0000 mg | ORAL_TABLET | Freq: Every day | ORAL | 0 refills | Status: DC
  Filled 2023-08-02: qty 30, 30d supply, fill #0

## 2023-08-02 MED ORDER — ASPIRIN 81 MG PO CHEW: 81.0000 mg | CHEWABLE_TABLET | Freq: Every day | ORAL | Status: DC

## 2023-08-02 MED ORDER — POLYETHYLENE GLYCOL 3350 17 G PO PACK: 17.0000 g | PACK | Freq: Every day | ORAL | Status: DC

## 2023-08-02 MED ORDER — ACETAMINOPHEN 325 MG PO TABS: 325.0000 mg | ORAL_TABLET | ORAL | Status: DC | PRN

## 2023-08-02 MED ORDER — MAGNESIUM OXIDE 400 MG PO TABS
400.0000 mg | ORAL_TABLET | Freq: Every day | ORAL | 0 refills | Status: DC
  Filled 2023-08-02: qty 30, 30d supply, fill #0

## 2023-08-02 MED ORDER — VITAMIN D3 50 MCG (2000 UT) PO TABS
4000.0000 [IU] | ORAL_TABLET | Freq: Every day | ORAL | 0 refills | Status: DC
  Filled 2023-08-02: qty 60, 30d supply, fill #0

## 2023-08-02 NOTE — Progress Notes (Signed)
Physical Therapy Session Note  Patient Details  Name: Russell Garcia MRN: [id number] Date of Birth: Jul 07, 1956  Today's Date: 08/02/2023 PT Individual Time: [phone] and 1430-1454 PT Individual Time Calculation (min): 31 min and 24 min  Short Term Goals: Week 1:  PT Short Term Goal 1 (Week 1): Pt will perform bed mobility with overall MinA. PT Short Term Goal 2 (Week 1): Pt will perform functional standing transfers with MinA. PT Short Term Goal 3 (Week 1): Pt will ambulate at least 50 ft per bout using LRAD with CGA. PT Short Term Goal 4 (Week 1): Pt will complete at least 1 step using RW with no more than ModA.  Skilled Therapeutic Interventions/Progress Updates:   Treatment Session 1 Received pt sitting in recliner, pt agreeable to PT treatment, and denied any pain during session. Session with emphasis on functional mobility/transfers, generalized strengthening and endurance, ambulation, and simulated car transfers. Pt performed all transfers with RW and supervision throughout session. Pt ambulated from recliner to [facility] with RW and supervision, then transported to/from room in [facility] dependently for time management purposes. Pt performed simulated car transfer with RW and supervision with increased time/effort to get LEs in/out of car, then ambulated 3ft on uneven surfaces (ramp) with RW and close supervision. Pt able to stand and pick up object from floor using reacher and supervision and recommended pt get one for home use to avoid unnecessary bending/strain on abdominal incision. Discussed equipment and not needing WC anymore - CSW notified of pt's plan to decline WC. Returned to room, ambulated back to recliner with RW and supervision, and concluded session with pt sitting in recliner, needs within reach, and seatbelt alarm on.   Treatment Session 2 Received pt sitting in recliner with IV running - RN notified and present to disconnect. Pt agreeable to PT treatment and denied any pain during  session. Session with emphasis on functional mobility/transfers, generalized strengthening and endurance, and gait training. Pt performed all transfers with RW and supervision throughout session. Pt ambulated 170ft x 2 trials with RW and supervision to/from dayroom. Pt performed seated BLE strengthening on Kinetron at 20 cm/sec for 1 minute x 3 trials with emphasis on glute/quad strength. Returned to room and transferred into supine with supervision. Pt left in care of NT for bladder scan.   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

## 2023-08-02 NOTE — Progress Notes (Signed)
Physical Therapy Discharge Summary  Patient Details  Name: SCHAUN DECARLI MRN: [id number] Date of Birth: 20-Jul-1956  Date of Discharge from PT service:August 02, 2023  Patient has met 9 of 9 long term goals due to improved activity tolerance, improved balance, improved postural control, increased strength, increased range of motion, decreased pain, ability to compensate for deficits, improved awareness, and improved coordination. Patient to discharge at an ambulatory level Supervision using RW. Patient's care partner is independent to provide the necessary physical and cognitive assistance at discharge. Pt's significant other present for OT family education, however brother unable to make it to scheduled family education on 10/7 due to illness.   All goals met   Recommendation:  Patient will benefit from ongoing skilled PT services in home health setting to continue to advance safe functional mobility, address ongoing impairments in transfers, generalized strengthening and endurance, dynamic standing balance/coordination, gait training, and to minimize fall risk.  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 Rarely or not at all Pain Interference with Therapy Activities: 2. Occasionally Pain Interference with Day-to-Day Activities: 1. Rarely or not at all Cognition Overall Cognitive Status: Within Functional Limits for tasks assessed Arousal/Alertness: Awake/alert Orientation Level: Oriented X4 Memory: Impaired Awareness: Appears intact Problem Solving: Appears intact Safety/Judgment: Appears intact Sensation Sensation Light Touch: Impaired  Detail Light Touch Impaired Details: Impaired LLE;Impaired LUE;Impaired RUE Hot/Cold: Not tested Proprioception: Impaired by gross assessment Stereognosis: Not tested Additional Comments: pt reports numbness along entire LLE, along entire RUE, and along fingertips on L hand Coordination Gross Motor Movements are Fluid and Coordinated: No Fine Motor Movements are Fluid and Coordinated: No Coordination and Movement Description: altered balance strategies due to RLE prosthetic Finger Nose Finger Test: decreased shoulder ROM and bilateral tremors Heel Shin Test: decreased ROM bilaterally (impaired due to prosthetic on RLE) Motor  Motor Motor: Other (comment) Motor - Skilled Clinical Observations: altered balance strategies due to RLE prosthetic  Mobility Bed Mobility Bed Mobility: Rolling Right;Rolling Left;Sit to Supine;Supine to Sit Rolling Right: Supervision/verbal cueing Rolling Left: Supervision/Verbal cueing Supine to Sit: Supervision/Verbal cueing Sit to Supine: Supervision/Verbal cueing Transfers Transfers: Sit to Stand;Stand to Sit;Stand Pivot Transfers Sit to Stand: Supervision/Verbal cueing Stand to Sit: Supervision/Verbal cueing Stand Pivot Transfers: Supervision/Verbal cueing Transfer (Assistive device): Rolling walker Locomotion  Gait Ambulation: Yes Gait Assistance: Supervision/Verbal cueing Gait Distance (Feet): 175 Feet Assistive device: Rolling walker Gait Gait: Yes Gait Pattern: Impaired Gait Pattern: Step-to pattern;Decreased stance time - right;Right flexed knee in stance;Decreased weight shift to right;Poor foot clearance - right;Decreased step length - right;Decreased stride length Gait velocity: decreased Stairs / Additional Locomotion Stairs: Yes Stairs Assistance: Contact Guard/Touching assist Stair Management Technique: With walker;Forwards Number of Stairs: 1 Height of Stairs: 5 Ramp: Supervision/Verbal cueing (RW) Curb: Contact Guard/Touching  assist (RW) Pick up small object from the floor assist level: Supervision/Verbal cueing Pick up small object from the floor assistive device: RW and Engineer, manufacturing Wheelchair Mobility: No  Trunk/Postural Assessment  Cervical Assessment Cervical Assessment: Exceptions to [facility] (forward head) Thoracic Assessment Thoracic Assessment: Exceptions to [facility] (thoracic rounding) Lumbar Assessment Lumbar Assessment: Within Functional Limits Postural Control Postural Control: Within Functional Limits  Balance Balance Balance Assessed: Yes Static Sitting Balance Static Sitting - Balance Support: Feet  supported;No upper extremity supported Static Sitting - Level of Assistance: 7: Independent Dynamic Sitting Balance Dynamic Sitting - Balance Support: No upper extremity supported;Feet supported Dynamic Sitting - Level of Assistance: 6: Modified independent (Device/Increase time) Static Standing Balance Static Standing - Balance Support: Bilateral upper extremity supported;During functional activity (RW) Static Standing - Level of Assistance: 5: Stand by assistance (supervision) Dynamic Standing Balance Dynamic Standing - Balance Support: Bilateral upper extremity supported;During functional activity (RW) Dynamic Standing - Level of Assistance: 5: Stand by assistance (supervision) Dynamic Standing - Comments: with transfers and gait Extremity Assessment  RLE Assessment RLE Assessment: Exceptions to [facility] General Strength Comments: tested sitting in recliner; limited ROM due to prosthetic RLE Strength Right Hip Flexion: 4-/5 Right Hip ABduction: 4/5 Right Hip ADduction: 4/5 Right Knee Flexion: 4-/5 Right Knee Extension: 4-/5 LLE Assessment LLE Assessment: Exceptions to [facility] General Strength Comments: tested sitting in recliner LLE Strength Left Hip Flexion: 4-/5 Left Hip ABduction: 4+/5 Left Hip ADduction: 4+/5 Left Knee Flexion: 4/5 Left Knee Extension: 4+/5 Left Ankle  Dorsiflexion: 4+/5 Left Ankle Plantar Flexion: 4+/5   Daphane Odekirk M Zaunegger Blima Rich PT, DPT 08/02/2023, 7:13 AM

## 2023-08-02 NOTE — Progress Notes (Signed)
Occupational Therapy Discharge Summary  Patient Details  Name: Russell Garcia MRN: [id number] Date of Birth: 1956-10-03  Date of Discharge from OT service:August 02, 2023   Patient has met 5 of 10 long term goals due to improved activity tolerance, improved balance, ability to compensate for deficits, functional use of  RIGHT upper and LEFT upper extremity, improved awareness, and improved coordination.  Patient to discharge at [facility] Assist level.  Patient's care partner is independent to provide the necessary physical and cognitive assistance at discharge. Family education was arranged and attempted however brother did not attend session therefore limited education provided. Verbal education offered to (ex) wife during OT session, however no hands on assist training completed.  Reasons goals not met: Upper body and lower body dressing, as well as bathing, toilet transfer and RUE functional use goals not met at supervision/independent level due to deficits in ROM, strength, balance, endurance, requiring increased assist  Recommendation:  Patient will benefit from ongoing skilled OT services in home health setting to continue to advance functional skills in the area of BADL, iADL, and Reduce care partner burden.  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); abdominal incision Restrictions Weight Bearing Restrictions: No ADL ADL Equipment Provided: Long-handled sponge Eating: Modified independent Where Assessed-Eating: Chair Grooming: Modified independent Where Assessed-Grooming: Sitting at sink Upper Body Bathing: Supervision/safety Where Assessed-Upper Body Bathing: Sitting at sink Lower Body Bathing: Supervision/safety Where  Assessed-Lower Body Bathing: Sitting at sink, Standing at sink Upper Body Dressing: Minimal assistance, Minimal cueing Where Assessed-Upper Body Dressing: Sitting at sink Lower Body Dressing: Minimal assistance Where Assessed-Lower Body Dressing: Sitting at sink, Standing at sink Toileting: Supervision/safety Where Assessed-Toileting: Teacher, adult education: Close supervision Toilet Transfer Method: Proofreader: Raised toilet seat, Grab bars, Other (comment) (RW) Tub/Shower Transfer: Not assessed Tub/Shower Transfer Method: Unable to assess Film/video editor: Close supervision Film/video editor Method: Designer, industrial/product: Grab bars, Shower seat with back ADL Comments: Patient's self care greatly limited by BUE deficits in ROM and strength. Vision Baseline Vision/History: 1 Wears glasses Patient Visual Report: No change from baseline Vision Assessment?: No apparent visual deficits Perception  Perception: Within Functional Limits Praxis Praxis: WFL Cognition Cognition Overall Cognitive Status: Within Functional Limits for tasks assessed Arousal/Alertness: Awake/alert Orientation Level: Person;Place;Situation Person: Oriented Place: Oriented Situation: Oriented Memory: Impaired Awareness: Appears intact Problem Solving: Appears intact Safety/Judgment: Appears intact Brief Interview for Mental Status (BIMS) Repetition of Three Words (First Attempt): 3 Temporal Orientation: Year: Correct Temporal Orientation: Month: Accurate within 5 days Temporal Orientation: Day: Correct Recall: "Sock": Yes, no cue required Recall: "Blue": Yes, no cue required Recall: "Bed": Yes, no cue required BIMS Summary Score: 15 Sensation Sensation Light Touch: Impaired Detail Light Touch Impaired Details: Impaired LLE;Impaired LUE;Impaired RUE Hot/Cold: Not tested Proprioception: Impaired by gross assessment Stereognosis: Not  tested Additional Comments: pt reports numbness along entire LLE, along entire RUE, and along fingertips on L hand Coordination Gross Motor Movements are Fluid and Coordinated: No Fine Motor Movements are Fluid and Coordinated: No Coordination and Movement Description: altered balance strategies due to RLE prosthetic Finger Nose Finger Test: decreased shoulder ROM and bilateral tremors Heel Shin Test: decreased ROM bilaterally (impaired due to prosthetic on RLE) Motor  Motor Motor: Other (comment) Motor - Skilled Clinical Observations: altered balance  strategies due to RLE prosthetic Mobility  Bed Mobility Bed Mobility: Rolling Right;Rolling Left;Sit to Supine;Supine to Sit Rolling Right: Supervision/verbal cueing Rolling Left: Supervision/Verbal cueing Supine to Sit: Supervision/Verbal cueing Sit to Supine: Supervision/Verbal cueing Transfers Sit to Stand: Supervision/Verbal cueing Stand to Sit: Supervision/Verbal cueing  Trunk/Postural Assessment  Cervical Assessment Cervical Assessment: Exceptions to [facility] (forward head) Thoracic Assessment Thoracic Assessment: Exceptions to [facility] (thoracic rounding) Lumbar Assessment Lumbar Assessment: Within Functional Limits Postural Control Postural Control: Within Functional Limits  Balance Balance Balance Assessed: Yes Static Sitting Balance Static Sitting - Balance Support: Feet supported;No upper extremity supported Static Sitting - Level of Assistance: 7: Independent Dynamic Sitting Balance Dynamic Sitting - Balance Support: No upper extremity supported;Feet supported Dynamic Sitting - Level of Assistance: 6: Modified independent (Device/Increase time) Static Standing Balance Static Standing - Balance Support: Bilateral upper extremity supported;During functional activity (RW) Static Standing - Level of Assistance: 5: Stand by assistance (supervision) Dynamic Standing Balance Dynamic Standing - Balance Support: Bilateral upper  extremity supported;During functional activity (RW) Dynamic Standing - Level of Assistance: 5: Stand by assistance (supervision) Dynamic Standing - Comments: with transfers and gait Extremity/Trunk Assessment RUE Assessment RUE Assessment: Exceptions to [facility] Passive Range of Motion (PROM) Comments: restricted at 70 degrees of shoulder flexion Active Range of Motion (AROM) Comments: < 45 degrees shoulder flexion General Strength Comments: 3+/5 grasp LUE Assessment LUE Assessment: Exceptions to [facility] Passive Range of Motion (PROM) Comments: Restriction at 90 degrees of shoulder flexion Active Range of Motion (AROM) Comments: < 100 degrees shoulder flexion General Strength Comments: grasp WFL   Avri Paiva E Dontea Corlew, MS, OTR/L  08/02/2023, 3:29 PM

## 2023-08-02 NOTE — Progress Notes (Signed)
Speech Language Pathology Discharge Summary  Patient Details  Name: Russell Garcia MRN: [id number] Date of Birth: 11/08/1955  Date of Discharge from SLP service:August 02, 2023  Today's Date: 08/02/2023 SLP Individual Time: 4098-1191 SLP Individual Time Calculation (min): 45 min  Skilled Therapeutic Interventions:  SLP conducted skilled therapy session targeting cognitive retraining goals and family education with brother and primary care partner present. SLP readministered cognitive testing to assess for lingering deficits d/t impending discharge from the hospital with patient exhibiting mild deficits in the areas of registration, attention, and problem solving. No other deficits identified on testing. SLP, patient, and family discussed remaining deficits and the need for supervision at discharge for complex cognitive tasks. Patient and family verbalized understanding. SLP and patient discussed plans for discharge and patient reported plans accurately and efficiently. Patient was left in chair with call bell in reach and chair alarm set. Patient appropriate for discharge from speech services given planned discharge from hospital. See discharge summary below.   Patient has met 3 of 3 long term goals.  Patient to discharge at overall Supervision level.  Reasons goals not met: n/a   Clinical Impression/Discharge Summary:  Patient has made excellent progress towards therapy goals meeting 3/3 long term goals during CIR admission. Patient is currently tolerating Dys3/thin liquid diet per patient preference utilizing safe swallowing strategies with modI. Cognitively, patient is functioning at an overall supervision level for basic and mildly complex tasks and minA for moderately complex tasks. SLP repeated COGNISTAT at final session with patient continuing to demonstrate mild deficits in the areas of attention and problem solving. Patient would benefit from SLP services in an outpatient setting to continue to  target lingering ST deficits. Patient and family education complete. Patient appropriate for discharge from SLP services.   Care Partner:  Caregiver Able to Provide Assistance: Yes  Type of Caregiver Assistance: Cognitive  Recommendation:  Outpatient SLP  Rationale for SLP Follow Up: Maximize cognitive function and independence   Equipment: n/a   Reasons for discharge: Discharged from hospital   Patient/Family Agrees with Progress Made and Goals Achieved: Yes   [name], M.A., CCC-SLP   [name] 08/02/2023, 12:57 PM

## 2023-08-02 NOTE — Plan of Care (Signed)
  Problem: RH Swallowing Goal: LTG Patient will consume least restrictive diet using compensatory strategies with assistance (SLP) Description: LTG:  Patient will consume least restrictive diet using compensatory strategies with assistance (SLP) Outcome: Completed/Met   Problem: RH Memory Goal: LTG Patient will demonstrate ability for day to day (SLP) Description: LTG:   Patient will demonstrate ability for day to day recall/carryover during cognitive/linguistic activities with assist  (SLP) Outcome: Completed/Met Goal: LTG Patient will use memory compensatory aids to (SLP) Description: LTG:  Patient will use memory compensatory aids to recall biographical/new, daily complex information with cues (SLP) Outcome: Completed/Met

## 2023-08-02 NOTE — Progress Notes (Signed)
Occupational Therapy Session Note  Patient Details  Name: Russell Garcia MRN: [id number] Date of Birth: 09/05/1956  Today's Date: 08/02/2023 OT Individual Time: 0805-0900 & 1350-1415 OT Individual Time Calculation (min): 55 min & 25 min   Short Term Goals: Week 2:  OT Short Term Goal 1 (Week 2): STG = LTG due to ELOS  Skilled Therapeutic Interventions/Progress Updates:  Session 1 Skilled OT intervention completed with focus on ADL retraining. Pt received lying at EOB, agreeable to session. No pain reported.  Pt requested to take off prosthetic limb and bathe. Pt reports he's been sleeping in his RLE prosthesis as "no one knows how to take it off but you and Tobi Bastos." Reminded pt of importance to let his Rt residual limb to breathe for pressure relief, and per pt, his brother knows how to doff it and can assist with hygiene.   Supervision sit > stand from elevated bed with RW then supervision ambulatory transfer with RW > sink. Able to doff pants down with supervision. Pt reported that his "hands are too weak to push the button" of the prosthetic. Total A needed to push release valve and doff the prosthesis, however pt able to doff sleeve/socket independently. Pt washed socket for hygiene and to maintain skin integrity with supervision. Set out to air dry.  Cues needed for sequencing and increased time needed for delayed processing, but pt able to doff shirt via pulling overhead with min A, wash UB with supervision, donn deo with min A, and donn shirt with min A for over head only. Washed residual limb and LLE with min A. Mod A needed to donn RLE socket, sleeve, and prosthesis. Threaded pants min A, stood supervision using sink for balance for peri-hygiene with CGA for balance. Donned pants over hips with min A.   Supervision sit > stand and ambulatory transfer with RW > recliner. Pt reflected on the events that brought him to the hospital; emotional support offered. Pt remained seated in recliner,  with chair alarm on/activated, and with all needs in reach at end of session.  Session 2 Skilled OT intervention completed with focus on toileting needs, ambulatory transfers, and eating modification education. Pt received supine in bed, agreeable to session however reporting that IV should be done and needs to be disconnected. Unrated pain reported in Rt trapezius; pre-medicated. OT offered rest breaks, repositioning and e-stim for pain reduction.  Pt required mod A for bed mobility to EOB with difficulty with trunk elevation due to abdominal wound therefore cued pt to log roll to side-lying however HHA still needed. Prosthesis already on. Completed supervision sit > stand using RW from elevated bed. Lowered pants with supervision then utilized waist strap to fasten urinal, then pt able to steady it as a modification for poor dexterity and fine motor control. Raised pants back up with CGA for balance. Ambulated with supervision using RW > recliner with assist needed only for IV pole.  OT issued pt a built up handle for spoon for poor grasping/fine motor control, to increase independence with self-feeding.   Pt remained seated in w/c, with chair alarm on/activated, and with all needs in reach at end of session.  [place] Stim One  Applied for 60 mins (unattended) to the Rt trapezius for pain management with the following parameters:  330 pulse width 35 Hz pulse rate On 8 sec/ off 8 sec Ramp up/ down 2 sec Symmetrical Biphasic wave form  Max intensity at 500 Ohm load  Removed from  pt at end of cycle with no adverse skin reaction or irritation noted.   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

## 2023-08-02 NOTE — Progress Notes (Signed)
Patient ID: Russell Garcia, male   DOB: 1956/02/18, 67 y.o.   MRN: [id number]   Patient no longer requires wheelchair per PT. [employer] informed.
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
## 2023-08-02 NOTE — Progress Notes (Signed)
Inpatient Rehabilitation Care Coordinator Discharge Note   Patient Details  Name: Russell Garcia MRN: [id number] Date of Birth: [birth year]   Discharge location: Home  Length of Stay: 11 Days  Discharge activity level: MOD I/SUP  Home/community participation: Brother, son, S.O and Friends  Patient response [id number] Literacy - How often do you need to have someone help you when you read instructions, pamphlets, or other written material from your doctor or pharmacy?: Rarely  Patient response [id number]:MVHQIO Isolation - How often do you feel lonely or isolated from those around you?: Never  Services provided included: SW, Neuropsych, Pharmacy, TR, CM, RN, OT, SLP, PT, RD, MD  Financial Services:  Financial Services Utilized: Private Insurance [employer]  Choices offered to/list presented to: Patient  Follow-up services arranged:  Home Health, DME Home Health Agency: [facility]urbish RN PT OT    DME : None    Patient response to transportation need: Is the patient able to respond to transportation needs?: Yes In the past 12 months, has lack of transportation kept you from medical appointments or from getting medications?: No In the past 12 months, has lack of transportation kept you from meetings, work, or from getting things needed for daily living?: No   Patient/Family verbalized understanding of follow-up arrangements:  Yes  Individual responsible for coordination of the follow-up plan: self  Confirmed correct DME delivered: Andria Rhein 08/02/2023    Comments (or additional information):  Summary of Stay    Date/Time Discharge Planning CSW  07/26/23 1524 Discharging home with brother who is retired.  Support from S.O and son as well.  1 level home 2 steps. Pt has a RW, SPC and BSC CJB       [employer]

## 2023-08-02 NOTE — Plan of Care (Signed)
  Problem: RH Bathing Goal: LTG Patient will bathe all body parts with assist levels (OT) Description: LTG: Patient will bathe all body parts with assist levels (OT) Outcome: Not Met (add Reason) Flowsheets (Taken 08/02/2023 1529) LTG: Pt will perform bathing with assistance level/cueing: (Not met due to limited AROM, as well as delayed processing, and endurance deficits) --   Problem: RH Dressing Goal: LTG Patient will perform upper body dressing (OT) Description: LTG Patient will perform upper body dressing with assist, with/without cues (OT). Outcome: Not Met (add Reason) Flowsheets (Taken 08/02/2023 1529) LTG: Pt will perform upper body dressing with assistance level of: (Not met due to limited AROM, as well as delayed processing, and endurance deficits) -- Goal: LTG Patient will perform lower body dressing w/assist (OT) Description: LTG: Patient will perform lower body dressing with assist, with/without cues in positioning using equipment (OT) Outcome: Not Met (add Reason) Flowsheets (Taken 08/02/2023 1529) LTG: Pt will perform lower body dressing with assistance level of: (Not met due to limited AROM, as well as delayed processing, and endurance deficits) --   Problem: RH Functional Use of Upper Extremity Goal: LTG Patient will use RT/LT upper extremity as a (OT) Description: LTG: Patient will use right/left upper extremity as a stabilizer/gross assist/diminished/nondominant/dominant level with assist, with/without cues during functional activity (OT) Outcome: Not Met (add Reason) Flowsheets (Taken 08/02/2023 1529) LTG: Use of upper extremity in functional activities: (Not met due to limited AROM, as well as delayed processing, and endurance deficits) --   Problem: RH Toilet Transfers Goal: LTG Patient will perform toilet transfers w/assist (OT) Description: LTG: Patient will perform toilet transfers with assist, with/without cues using equipment (OT) Outcome: Not Met (add  Reason) Flowsheets (Taken 08/02/2023 1529) LTG: Pt will perform toilet transfers with assistance level of: (Not met due to limited AROM, as well as delayed processing, and endurance deficits) --   Problem: RH Balance Goal: LTG Patient will maintain dynamic standing with ADLs (OT) Description: LTG:  Patient will maintain dynamic standing balance with assist during activities of daily living (OT)  Outcome: Completed/Met   Problem: RH Eating Goal: LTG Patient will perform eating w/assist, cues/equip (OT) Description: LTG: Patient will perform eating with assist, with/without cues using equipment (OT) Outcome: Completed/Met   Problem: RH Grooming Goal: LTG Patient will perform grooming w/assist,cues/equip (OT) Description: LTG: Patient will perform grooming with assist, with/without cues using equipment (OT) Outcome: Completed/Met   Problem: RH Toileting Goal: LTG Patient will perform toileting task (3/3 steps) with assistance level (OT) Description: LTG: Patient will perform toileting task (3/3 steps) with assistance level (OT)  Outcome: Completed/Met   Problem: RH Tub/Shower Transfers Goal: LTG Patient will perform tub/shower transfers w/assist (OT) Description: LTG: Patient will perform tub/shower transfers with assist, with/without cues using equipment (OT) Outcome: Completed/Met

## 2023-08-03 ENCOUNTER — Other Ambulatory Visit (HOSPITAL_COMMUNITY): Payer: Self-pay

## 2023-08-03 DIAGNOSIS — S5411XD Injury of median nerve at forearm level, right arm, subsequent encounter: Secondary | ICD-10-CM | POA: Diagnosis not present

## 2023-08-03 DIAGNOSIS — W3400XA Accidental discharge from unspecified firearms or gun, initial encounter: Secondary | ICD-10-CM | POA: Diagnosis not present
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
## 2023-08-03 NOTE — Progress Notes (Signed)
Inpatient Rehabilitation Discharge Medication Review by a Pharmacist   A complete drug regimen review was completed for this patient to identify any potential clinically significant medication issues.   High Risk Drug Classes Is patient taking? Indication by Medication  Antipsychotic Yes Quetiapine - mood, agitation  Anticoagulant No    Antibiotic No   Opioid Yes PRN Oxycodone - severe pain  Antiplatelet Yes Aspirin 81 mg - cardioprotective  Hypoglycemics/insulin No    Vasoactive Medication Yes Valsartan-Hydrochlorothiazide, metoprolol - hypertension  Chemotherapy No    Other Yes Atorvastatin - hyperlipidemia Metoclopramide - GI motility Vitamin D, magnesium oxide - supplements Miralax, senna - laxatives Trazodone - sleep  PRN:  Acetaminophen - mild pain Diclofenac gel - topical pain relief        Type of Medication Issue Identified Description of Issue Recommendation(s)  Drug Interaction(s) (clinically significant)        Duplicate Therapy   Prior lisinopril-hydrochlorothiazide and carvedilol discontinued. To continue valsartan-hydrochlorothiazide and metoprolol.  Allergy        No Medication Administration End Date        Incorrect Dose        Additional Drug Therapy Needed        Significant med changes from prior encounter (inform family/care partners about these prior to discharge). Asprin dose reduced. Prior amlodipine discontinued (last filled in November 2023).  PRN Percocet changed to Oxycodone.   Communicate changes with patient/family prior to discharge.      Other   Irbesartan for valsartan while inpatient. Back to Valsartan at discharge.      Clinically significant medication issues were identified that warrant physician communication and completion of prescribed/recommended actions by midnight of the next day:  No   Name of provider notified for urgent issues identified:    Provider Method of Notification:    Pharmacist comments:     Time spent  performing this drug regimen review (minutes):  20 minutes  Khai Torbert A. Jeanella Craze, PharmD, BCPS, FNKF Clinical Pharmacist [place] Please utilize Amion for appropriate phone number to reach the unit pharmacist [facility])

## 2023-08-05 ENCOUNTER — Telehealth: Payer: Self-pay | Admitting: Specialist

## 2023-08-05 DIAGNOSIS — J9811 Atelectasis: Secondary | ICD-10-CM | POA: Diagnosis not present

## 2023-08-05 DIAGNOSIS — S45891D Other specified injury of other specified blood vessels at shoulder and upper arm level, right arm, subsequent encounter: Secondary | ICD-10-CM | POA: Diagnosis not present

## 2023-08-05 DIAGNOSIS — D62 Acute posthemorrhagic anemia: Secondary | ICD-10-CM | POA: Diagnosis not present

## 2023-08-05 DIAGNOSIS — I10 Essential (primary) hypertension: Secondary | ICD-10-CM | POA: Diagnosis not present

## 2023-08-05 DIAGNOSIS — I700 Atherosclerosis of aorta: Secondary | ICD-10-CM | POA: Diagnosis not present

## 2023-08-05 DIAGNOSIS — S36113D Laceration of liver, unspecified degree, subsequent encounter: Secondary | ICD-10-CM | POA: Diagnosis not present

## 2023-08-05 DIAGNOSIS — F4310 Post-traumatic stress disorder, unspecified: Secondary | ICD-10-CM | POA: Diagnosis not present

## 2023-08-05 DIAGNOSIS — Z48815 Encounter for surgical aftercare following surgery on the digestive system: Secondary | ICD-10-CM | POA: Diagnosis not present

## 2023-08-05 DIAGNOSIS — J189 Pneumonia, unspecified organism: Secondary | ICD-10-CM | POA: Diagnosis not present

## 2023-08-05 NOTE — Telephone Encounter (Signed)
Heather from [place] called. RN will be performing wound care please write order for 1w1, 2w1, 1w8.  Confirm that Dr. Carlis Abbott will be the signing MD. Call Heather at [phone] Shirlean Mylar, MHA, OT/L [phone]

## 2023-08-08 ENCOUNTER — Telehealth: Payer: Self-pay | Admitting: *Deleted

## 2023-08-08 DIAGNOSIS — J189 Pneumonia, unspecified organism: Secondary | ICD-10-CM | POA: Diagnosis not present

## 2023-08-08 DIAGNOSIS — F4310 Post-traumatic stress disorder, unspecified: Secondary | ICD-10-CM | POA: Diagnosis not present

## 2023-08-08 DIAGNOSIS — S36113D Laceration of liver, unspecified degree, subsequent encounter: Secondary | ICD-10-CM | POA: Diagnosis not present

## 2023-08-08 DIAGNOSIS — I10 Essential (primary) hypertension: Secondary | ICD-10-CM | POA: Diagnosis not present

## 2023-08-08 DIAGNOSIS — S45891D Other specified injury of other specified blood vessels at shoulder and upper arm level, right arm, subsequent encounter: Secondary | ICD-10-CM | POA: Diagnosis not present

## 2023-08-08 DIAGNOSIS — D62 Acute posthemorrhagic anemia: Secondary | ICD-10-CM | POA: Diagnosis not present

## 2023-08-08 DIAGNOSIS — J9811 Atelectasis: Secondary | ICD-10-CM | POA: Diagnosis not present

## 2023-08-08 DIAGNOSIS — Z48815 Encounter for surgical aftercare following surgery on the digestive system: Secondary | ICD-10-CM | POA: Diagnosis not present

## 2023-08-08 DIAGNOSIS — I700 Atherosclerosis of aorta: Secondary | ICD-10-CM | POA: Diagnosis not present

## 2023-08-08 NOTE — Telephone Encounter (Signed)
Debroah Loop PT called for POC 2wk2,1wk2.2wk2,1wk2.  Approval given.

## 2023-08-08 NOTE — Telephone Encounter (Signed)
Left voice mail stating that Dr. Carlis Abbott will sign initial order but since patient is not following up with her then additional orders will have to go to PCP.

## 2023-08-09 DIAGNOSIS — I700 Atherosclerosis of aorta: Secondary | ICD-10-CM | POA: Diagnosis not present

## 2023-08-09 DIAGNOSIS — F4310 Post-traumatic stress disorder, unspecified: Secondary | ICD-10-CM | POA: Diagnosis not present

## 2023-08-09 DIAGNOSIS — J189 Pneumonia, unspecified organism: Secondary | ICD-10-CM | POA: Diagnosis not present

## 2023-08-09 DIAGNOSIS — J9811 Atelectasis: Secondary | ICD-10-CM | POA: Diagnosis not present

## 2023-08-09 DIAGNOSIS — S36113D Laceration of liver, unspecified degree, subsequent encounter: Secondary | ICD-10-CM | POA: Diagnosis not present

## 2023-08-09 DIAGNOSIS — D62 Acute posthemorrhagic anemia: Secondary | ICD-10-CM | POA: Diagnosis not present

## 2023-08-09 DIAGNOSIS — Z48815 Encounter for surgical aftercare following surgery on the digestive system: Secondary | ICD-10-CM | POA: Diagnosis not present

## 2023-08-09 DIAGNOSIS — I10 Essential (primary) hypertension: Secondary | ICD-10-CM | POA: Diagnosis not present

## 2023-08-09 DIAGNOSIS — S45891D Other specified injury of other specified blood vessels at shoulder and upper arm level, right arm, subsequent encounter: Secondary | ICD-10-CM | POA: Diagnosis not present

## 2023-08-10 ENCOUNTER — Telehealth: Payer: Self-pay

## 2023-08-10 DIAGNOSIS — J189 Pneumonia, unspecified organism: Secondary | ICD-10-CM | POA: Diagnosis not present

## 2023-08-10 DIAGNOSIS — F4310 Post-traumatic stress disorder, unspecified: Secondary | ICD-10-CM | POA: Diagnosis not present

## 2023-08-10 DIAGNOSIS — I700 Atherosclerosis of aorta: Secondary | ICD-10-CM | POA: Diagnosis not present

## 2023-08-10 DIAGNOSIS — Z48815 Encounter for surgical aftercare following surgery on the digestive system: Secondary | ICD-10-CM | POA: Diagnosis not present

## 2023-08-10 DIAGNOSIS — S36113D Laceration of liver, unspecified degree, subsequent encounter: Secondary | ICD-10-CM | POA: Diagnosis not present

## 2023-08-10 DIAGNOSIS — S45891D Other specified injury of other specified blood vessels at shoulder and upper arm level, right arm, subsequent encounter: Secondary | ICD-10-CM | POA: Diagnosis not present

## 2023-08-10 DIAGNOSIS — J9811 Atelectasis: Secondary | ICD-10-CM | POA: Diagnosis not present

## 2023-08-10 DIAGNOSIS — D62 Acute posthemorrhagic anemia: Secondary | ICD-10-CM | POA: Diagnosis not present

## 2023-08-10 DIAGNOSIS — I10 Essential (primary) hypertension: Secondary | ICD-10-CM | POA: Diagnosis not present

## 2023-08-10 NOTE — Telephone Encounter (Signed)
Roe Coombs, OT from [place] called with frequency for visits. Six totals visits over the next 8 weeks. Orders approved.

## 2023-08-11 DIAGNOSIS — I10 Essential (primary) hypertension: Secondary | ICD-10-CM | POA: Diagnosis not present

## 2023-08-11 DIAGNOSIS — J189 Pneumonia, unspecified organism: Secondary | ICD-10-CM | POA: Diagnosis not present

## 2023-08-11 DIAGNOSIS — F4310 Post-traumatic stress disorder, unspecified: Secondary | ICD-10-CM | POA: Diagnosis not present

## 2023-08-11 DIAGNOSIS — I700 Atherosclerosis of aorta: Secondary | ICD-10-CM | POA: Diagnosis not present

## 2023-08-11 DIAGNOSIS — S36113D Laceration of liver, unspecified degree, subsequent encounter: Secondary | ICD-10-CM | POA: Diagnosis not present

## 2023-08-11 DIAGNOSIS — S45891D Other specified injury of other specified blood vessels at shoulder and upper arm level, right arm, subsequent encounter: Secondary | ICD-10-CM | POA: Diagnosis not present

## 2023-08-11 DIAGNOSIS — Z48815 Encounter for surgical aftercare following surgery on the digestive system: Secondary | ICD-10-CM | POA: Diagnosis not present

## 2023-08-11 DIAGNOSIS — J9811 Atelectasis: Secondary | ICD-10-CM | POA: Diagnosis not present

## 2023-08-11 DIAGNOSIS — D62 Acute posthemorrhagic anemia: Secondary | ICD-10-CM | POA: Diagnosis not present

## 2023-08-12 DIAGNOSIS — J9811 Atelectasis: Secondary | ICD-10-CM | POA: Diagnosis not present

## 2023-08-12 DIAGNOSIS — S45891D Other specified injury of other specified blood vessels at shoulder and upper arm level, right arm, subsequent encounter: Secondary | ICD-10-CM | POA: Diagnosis not present

## 2023-08-12 DIAGNOSIS — I10 Essential (primary) hypertension: Secondary | ICD-10-CM | POA: Diagnosis not present

## 2023-08-12 DIAGNOSIS — Z48815 Encounter for surgical aftercare following surgery on the digestive system: Secondary | ICD-10-CM | POA: Diagnosis not present

## 2023-08-12 DIAGNOSIS — S36113D Laceration of liver, unspecified degree, subsequent encounter: Secondary | ICD-10-CM | POA: Diagnosis not present

## 2023-08-12 DIAGNOSIS — D62 Acute posthemorrhagic anemia: Secondary | ICD-10-CM | POA: Diagnosis not present

## 2023-08-12 DIAGNOSIS — I700 Atherosclerosis of aorta: Secondary | ICD-10-CM | POA: Diagnosis not present

## 2023-08-12 DIAGNOSIS — F4310 Post-traumatic stress disorder, unspecified: Secondary | ICD-10-CM | POA: Diagnosis not present

## 2023-08-12 DIAGNOSIS — J189 Pneumonia, unspecified organism: Secondary | ICD-10-CM | POA: Diagnosis not present

## 2023-08-15 DIAGNOSIS — S45891D Other specified injury of other specified blood vessels at shoulder and upper arm level, right arm, subsequent encounter: Secondary | ICD-10-CM | POA: Diagnosis not present

## 2023-08-15 DIAGNOSIS — I700 Atherosclerosis of aorta: Secondary | ICD-10-CM | POA: Diagnosis not present

## 2023-08-15 DIAGNOSIS — D62 Acute posthemorrhagic anemia: Secondary | ICD-10-CM | POA: Diagnosis not present

## 2023-08-15 DIAGNOSIS — J189 Pneumonia, unspecified organism: Secondary | ICD-10-CM | POA: Diagnosis not present

## 2023-08-15 DIAGNOSIS — I10 Essential (primary) hypertension: Secondary | ICD-10-CM | POA: Diagnosis not present

## 2023-08-15 DIAGNOSIS — Z48815 Encounter for surgical aftercare following surgery on the digestive system: Secondary | ICD-10-CM | POA: Diagnosis not present

## 2023-08-15 DIAGNOSIS — S36113D Laceration of liver, unspecified degree, subsequent encounter: Secondary | ICD-10-CM | POA: Diagnosis not present

## 2023-08-15 DIAGNOSIS — F4310 Post-traumatic stress disorder, unspecified: Secondary | ICD-10-CM | POA: Diagnosis not present

## 2023-08-15 DIAGNOSIS — J9811 Atelectasis: Secondary | ICD-10-CM | POA: Diagnosis not present

## 2023-08-16 DIAGNOSIS — S36113D Laceration of liver, unspecified degree, subsequent encounter: Secondary | ICD-10-CM | POA: Diagnosis not present

## 2023-08-16 DIAGNOSIS — J9811 Atelectasis: Secondary | ICD-10-CM | POA: Diagnosis not present

## 2023-08-16 DIAGNOSIS — D62 Acute posthemorrhagic anemia: Secondary | ICD-10-CM | POA: Diagnosis not present

## 2023-08-16 DIAGNOSIS — I10 Essential (primary) hypertension: Secondary | ICD-10-CM | POA: Diagnosis not present

## 2023-08-16 DIAGNOSIS — J189 Pneumonia, unspecified organism: Secondary | ICD-10-CM | POA: Diagnosis not present

## 2023-08-16 DIAGNOSIS — Z48815 Encounter for surgical aftercare following surgery on the digestive system: Secondary | ICD-10-CM | POA: Diagnosis not present

## 2023-08-16 DIAGNOSIS — S45891D Other specified injury of other specified blood vessels at shoulder and upper arm level, right arm, subsequent encounter: Secondary | ICD-10-CM | POA: Diagnosis not present

## 2023-08-16 DIAGNOSIS — I700 Atherosclerosis of aorta: Secondary | ICD-10-CM | POA: Diagnosis not present

## 2023-08-16 DIAGNOSIS — F4310 Post-traumatic stress disorder, unspecified: Secondary | ICD-10-CM | POA: Diagnosis not present

## 2023-08-17 DIAGNOSIS — J9811 Atelectasis: Secondary | ICD-10-CM | POA: Diagnosis not present

## 2023-08-17 DIAGNOSIS — D62 Acute posthemorrhagic anemia: Secondary | ICD-10-CM | POA: Diagnosis not present

## 2023-08-17 DIAGNOSIS — F4310 Post-traumatic stress disorder, unspecified: Secondary | ICD-10-CM | POA: Diagnosis not present

## 2023-08-17 DIAGNOSIS — I700 Atherosclerosis of aorta: Secondary | ICD-10-CM | POA: Diagnosis not present

## 2023-08-17 DIAGNOSIS — S36113D Laceration of liver, unspecified degree, subsequent encounter: Secondary | ICD-10-CM | POA: Diagnosis not present

## 2023-08-17 DIAGNOSIS — I10 Essential (primary) hypertension: Secondary | ICD-10-CM | POA: Diagnosis not present

## 2023-08-17 DIAGNOSIS — J189 Pneumonia, unspecified organism: Secondary | ICD-10-CM | POA: Diagnosis not present

## 2023-08-17 DIAGNOSIS — S45891D Other specified injury of other specified blood vessels at shoulder and upper arm level, right arm, subsequent encounter: Secondary | ICD-10-CM | POA: Diagnosis not present

## 2023-08-17 DIAGNOSIS — Z48815 Encounter for surgical aftercare following surgery on the digestive system: Secondary | ICD-10-CM | POA: Diagnosis not present

## 2023-08-18 DIAGNOSIS — S21331D Puncture wound without foreign body of right front wall of thorax with penetration into thoracic cavity, subsequent encounter: Secondary | ICD-10-CM | POA: Diagnosis not present

## 2023-08-18 DIAGNOSIS — W3400XD Accidental discharge from unspecified firearms or gun, subsequent encounter: Secondary | ICD-10-CM | POA: Diagnosis not present

## 2023-08-19 ENCOUNTER — Other Ambulatory Visit: Payer: Self-pay | Admitting: *Deleted

## 2023-08-19 ENCOUNTER — Telehealth: Payer: Self-pay | Admitting: Family Medicine

## 2023-08-19 DIAGNOSIS — M79601 Pain in right arm: Secondary | ICD-10-CM

## 2023-08-19 NOTE — Telephone Encounter (Signed)
Aram Beecham From [facility] she is requesting a call back for issue identified during post discharge Medication reconciliation please contact [phone]

## 2023-08-19 NOTE — Telephone Encounter (Signed)
Attempted call to [facility]- office closed. Will call back during regular business hours.

## 2023-08-22 DIAGNOSIS — F4310 Post-traumatic stress disorder, unspecified: Secondary | ICD-10-CM | POA: Diagnosis not present

## 2023-08-22 DIAGNOSIS — S36113D Laceration of liver, unspecified degree, subsequent encounter: Secondary | ICD-10-CM | POA: Diagnosis not present

## 2023-08-22 DIAGNOSIS — D62 Acute posthemorrhagic anemia: Secondary | ICD-10-CM | POA: Diagnosis not present

## 2023-08-22 DIAGNOSIS — I700 Atherosclerosis of aorta: Secondary | ICD-10-CM | POA: Diagnosis not present

## 2023-08-22 DIAGNOSIS — Z48815 Encounter for surgical aftercare following surgery on the digestive system: Secondary | ICD-10-CM | POA: Diagnosis not present

## 2023-08-22 DIAGNOSIS — J9811 Atelectasis: Secondary | ICD-10-CM | POA: Diagnosis not present

## 2023-08-22 DIAGNOSIS — S45891D Other specified injury of other specified blood vessels at shoulder and upper arm level, right arm, subsequent encounter: Secondary | ICD-10-CM | POA: Diagnosis not present

## 2023-08-22 DIAGNOSIS — I10 Essential (primary) hypertension: Secondary | ICD-10-CM | POA: Diagnosis not present

## 2023-08-22 DIAGNOSIS — J189 Pneumonia, unspecified organism: Secondary | ICD-10-CM | POA: Diagnosis not present
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
## 2023-08-23 NOTE — Telephone Encounter (Signed)
[employer] found two issues of concern with post discharge medication review.   1-  patient regularly forgetting or being sleep and doesn't take 4 th dose of medication ( when asked not known which med was being discussed)  recommended that medication be changed to oned daily metoprolol succinate 50-100mg  ER  to help patient condition and help with adherence . BP appears to be controlled on current regimen but 4th dose is difficult to get in on awake hours   2- combination of metoclopramide 10mg  / clintiapine 50mg   may increase risk for additive effects including increase frequency and severity  of TD, EPS, NMS some manufacturers labels for metoclopramide  contradict use of drugs together while others state avoidance is necessary. If agents must be used together . Monitor closely for movement disorder and additive CNS effects. There may also be additive  sedation . Discontinue these meds  at firs signs of dyskenisia. Patient has been on med together for several weeks wtihou complications. Instructed patient to advise doctor of any issues.   [employer] will refax this ( states was faxed twice previously  08/15/23 and 08/19/23 )  Recommended that be signed as reviewed and returned so no further calls will be received.

## 2023-08-23 NOTE — Telephone Encounter (Signed)
Attempted to contact patient.left a voice mail message requesting a return call.

## 2023-08-25 ENCOUNTER — Ambulatory Visit: Payer: Medicare HMO | Admitting: Vascular Surgery

## 2023-08-25 ENCOUNTER — Encounter: Payer: Self-pay | Admitting: Vascular Surgery

## 2023-08-25 ENCOUNTER — Ambulatory Visit (HOSPITAL_COMMUNITY)
Admission: RE | Admit: 2023-08-25 | Discharge: 2023-08-25 | Disposition: A | Discharge status: Home / Self Care | Payer: Medicare HMO | Source: Ambulatory Visit | Attending: Vascular Surgery | Admitting: Vascular Surgery

## 2023-08-25 VITALS — BP 156/103 | HR 65 | Temp 97.9°F | Resp 20 | Ht 72.0 in | Wt 265.0 lb

## 2023-08-25 DIAGNOSIS — Z95828 Presence of other vascular implants and grafts: Secondary | ICD-10-CM

## 2023-08-25 DIAGNOSIS — M79601 Pain in right arm: Secondary | ICD-10-CM | POA: Diagnosis not present

## 2023-08-29 DIAGNOSIS — J189 Pneumonia, unspecified organism: Secondary | ICD-10-CM | POA: Diagnosis not present

## 2023-08-29 DIAGNOSIS — J9811 Atelectasis: Secondary | ICD-10-CM | POA: Diagnosis not present

## 2023-08-29 DIAGNOSIS — Z48815 Encounter for surgical aftercare following surgery on the digestive system: Secondary | ICD-10-CM | POA: Diagnosis not present

## 2023-08-29 DIAGNOSIS — I10 Essential (primary) hypertension: Secondary | ICD-10-CM | POA: Diagnosis not present

## 2023-08-29 DIAGNOSIS — S45891D Other specified injury of other specified blood vessels at shoulder and upper arm level, right arm, subsequent encounter: Secondary | ICD-10-CM | POA: Diagnosis not present

## 2023-08-29 DIAGNOSIS — D62 Acute posthemorrhagic anemia: Secondary | ICD-10-CM | POA: Diagnosis not present

## 2023-08-29 DIAGNOSIS — I700 Atherosclerosis of aorta: Secondary | ICD-10-CM | POA: Diagnosis not present

## 2023-08-29 DIAGNOSIS — S36113D Laceration of liver, unspecified degree, subsequent encounter: Secondary | ICD-10-CM | POA: Diagnosis not present

## 2023-08-29 DIAGNOSIS — F4310 Post-traumatic stress disorder, unspecified: Secondary | ICD-10-CM | POA: Diagnosis not present

## 2023-08-30 DIAGNOSIS — I10 Essential (primary) hypertension: Secondary | ICD-10-CM | POA: Diagnosis not present

## 2023-08-30 DIAGNOSIS — F4310 Post-traumatic stress disorder, unspecified: Secondary | ICD-10-CM | POA: Diagnosis not present

## 2023-08-30 DIAGNOSIS — J9811 Atelectasis: Secondary | ICD-10-CM | POA: Diagnosis not present

## 2023-08-30 DIAGNOSIS — J189 Pneumonia, unspecified organism: Secondary | ICD-10-CM | POA: Diagnosis not present

## 2023-08-30 DIAGNOSIS — Z48815 Encounter for surgical aftercare following surgery on the digestive system: Secondary | ICD-10-CM | POA: Diagnosis not present

## 2023-08-30 DIAGNOSIS — I700 Atherosclerosis of aorta: Secondary | ICD-10-CM | POA: Diagnosis not present

## 2023-08-30 DIAGNOSIS — S45891D Other specified injury of other specified blood vessels at shoulder and upper arm level, right arm, subsequent encounter: Secondary | ICD-10-CM | POA: Diagnosis not present

## 2023-08-30 DIAGNOSIS — D62 Acute posthemorrhagic anemia: Secondary | ICD-10-CM | POA: Diagnosis not present

## 2023-08-30 DIAGNOSIS — S36113D Laceration of liver, unspecified degree, subsequent encounter: Secondary | ICD-10-CM | POA: Diagnosis not present

## 2023-09-01 DIAGNOSIS — I10 Essential (primary) hypertension: Secondary | ICD-10-CM | POA: Diagnosis not present

## 2023-09-01 DIAGNOSIS — J189 Pneumonia, unspecified organism: Secondary | ICD-10-CM | POA: Diagnosis not present

## 2023-09-01 DIAGNOSIS — Z48815 Encounter for surgical aftercare following surgery on the digestive system: Secondary | ICD-10-CM | POA: Diagnosis not present

## 2023-09-01 DIAGNOSIS — M79601 Pain in right arm: Secondary | ICD-10-CM | POA: Diagnosis not present

## 2023-09-01 DIAGNOSIS — I700 Atherosclerosis of aorta: Secondary | ICD-10-CM | POA: Diagnosis not present

## 2023-09-01 DIAGNOSIS — D62 Acute posthemorrhagic anemia: Secondary | ICD-10-CM | POA: Diagnosis not present

## 2023-09-01 DIAGNOSIS — J9811 Atelectasis: Secondary | ICD-10-CM | POA: Diagnosis not present

## 2023-09-01 DIAGNOSIS — S36113D Laceration of liver, unspecified degree, subsequent encounter: Secondary | ICD-10-CM | POA: Diagnosis not present

## 2023-09-01 DIAGNOSIS — F4310 Post-traumatic stress disorder, unspecified: Secondary | ICD-10-CM | POA: Diagnosis not present

## 2023-09-01 DIAGNOSIS — S45891D Other specified injury of other specified blood vessels at shoulder and upper arm level, right arm, subsequent encounter: Secondary | ICD-10-CM | POA: Diagnosis not present

## 2023-09-02 DIAGNOSIS — Z89511 Acquired absence of right leg below knee: Secondary | ICD-10-CM | POA: Diagnosis not present

## 2023-09-05 DIAGNOSIS — S36113D Laceration of liver, unspecified degree, subsequent encounter: Secondary | ICD-10-CM | POA: Diagnosis not present

## 2023-09-05 DIAGNOSIS — I10 Essential (primary) hypertension: Secondary | ICD-10-CM | POA: Diagnosis not present

## 2023-09-05 DIAGNOSIS — D62 Acute posthemorrhagic anemia: Secondary | ICD-10-CM | POA: Diagnosis not present

## 2023-09-05 DIAGNOSIS — J189 Pneumonia, unspecified organism: Secondary | ICD-10-CM | POA: Diagnosis not present

## 2023-09-05 DIAGNOSIS — Z48815 Encounter for surgical aftercare following surgery on the digestive system: Secondary | ICD-10-CM | POA: Diagnosis not present

## 2023-09-05 DIAGNOSIS — F4310 Post-traumatic stress disorder, unspecified: Secondary | ICD-10-CM | POA: Diagnosis not present

## 2023-09-05 DIAGNOSIS — I700 Atherosclerosis of aorta: Secondary | ICD-10-CM | POA: Diagnosis not present

## 2023-09-05 DIAGNOSIS — J9811 Atelectasis: Secondary | ICD-10-CM | POA: Diagnosis not present

## 2023-09-05 DIAGNOSIS — S45891D Other specified injury of other specified blood vessels at shoulder and upper arm level, right arm, subsequent encounter: Secondary | ICD-10-CM | POA: Diagnosis not present

## 2023-09-06 DIAGNOSIS — J189 Pneumonia, unspecified organism: Secondary | ICD-10-CM | POA: Diagnosis not present

## 2023-09-06 DIAGNOSIS — Z48815 Encounter for surgical aftercare following surgery on the digestive system: Secondary | ICD-10-CM | POA: Diagnosis not present

## 2023-09-06 DIAGNOSIS — S45891D Other specified injury of other specified blood vessels at shoulder and upper arm level, right arm, subsequent encounter: Secondary | ICD-10-CM | POA: Diagnosis not present

## 2023-09-06 DIAGNOSIS — J9811 Atelectasis: Secondary | ICD-10-CM | POA: Diagnosis not present

## 2023-09-06 DIAGNOSIS — I700 Atherosclerosis of aorta: Secondary | ICD-10-CM | POA: Diagnosis not present

## 2023-09-06 DIAGNOSIS — S36113D Laceration of liver, unspecified degree, subsequent encounter: Secondary | ICD-10-CM | POA: Diagnosis not present

## 2023-09-06 DIAGNOSIS — I10 Essential (primary) hypertension: Secondary | ICD-10-CM | POA: Diagnosis not present

## 2023-09-06 DIAGNOSIS — F4310 Post-traumatic stress disorder, unspecified: Secondary | ICD-10-CM | POA: Diagnosis not present

## 2023-09-06 DIAGNOSIS — D62 Acute posthemorrhagic anemia: Secondary | ICD-10-CM | POA: Diagnosis not present

## 2023-09-07 DIAGNOSIS — S36113D Laceration of liver, unspecified degree, subsequent encounter: Secondary | ICD-10-CM | POA: Diagnosis not present

## 2023-09-07 DIAGNOSIS — J189 Pneumonia, unspecified organism: Secondary | ICD-10-CM | POA: Diagnosis not present

## 2023-09-07 DIAGNOSIS — D62 Acute posthemorrhagic anemia: Secondary | ICD-10-CM | POA: Diagnosis not present

## 2023-09-07 DIAGNOSIS — I10 Essential (primary) hypertension: Secondary | ICD-10-CM | POA: Diagnosis not present

## 2023-09-07 DIAGNOSIS — J9811 Atelectasis: Secondary | ICD-10-CM | POA: Diagnosis not present

## 2023-09-07 DIAGNOSIS — Z48815 Encounter for surgical aftercare following surgery on the digestive system: Secondary | ICD-10-CM | POA: Diagnosis not present

## 2023-09-07 DIAGNOSIS — F4310 Post-traumatic stress disorder, unspecified: Secondary | ICD-10-CM | POA: Diagnosis not present

## 2023-09-07 DIAGNOSIS — I700 Atherosclerosis of aorta: Secondary | ICD-10-CM | POA: Diagnosis not present

## 2023-09-07 DIAGNOSIS — S45891D Other specified injury of other specified blood vessels at shoulder and upper arm level, right arm, subsequent encounter: Secondary | ICD-10-CM | POA: Diagnosis not present

## 2023-09-08 DIAGNOSIS — D62 Acute posthemorrhagic anemia: Secondary | ICD-10-CM | POA: Diagnosis not present

## 2023-09-08 DIAGNOSIS — S36113D Laceration of liver, unspecified degree, subsequent encounter: Secondary | ICD-10-CM | POA: Diagnosis not present

## 2023-09-08 DIAGNOSIS — I10 Essential (primary) hypertension: Secondary | ICD-10-CM | POA: Diagnosis not present

## 2023-09-08 DIAGNOSIS — F4310 Post-traumatic stress disorder, unspecified: Secondary | ICD-10-CM | POA: Diagnosis not present

## 2023-09-08 DIAGNOSIS — S45891D Other specified injury of other specified blood vessels at shoulder and upper arm level, right arm, subsequent encounter: Secondary | ICD-10-CM | POA: Diagnosis not present

## 2023-09-08 DIAGNOSIS — Z48815 Encounter for surgical aftercare following surgery on the digestive system: Secondary | ICD-10-CM | POA: Diagnosis not present

## 2023-09-08 DIAGNOSIS — J9811 Atelectasis: Secondary | ICD-10-CM | POA: Diagnosis not present

## 2023-09-08 DIAGNOSIS — I700 Atherosclerosis of aorta: Secondary | ICD-10-CM | POA: Diagnosis not present

## 2023-09-08 DIAGNOSIS — J189 Pneumonia, unspecified organism: Secondary | ICD-10-CM | POA: Diagnosis not present

## 2023-09-12 ENCOUNTER — Other Ambulatory Visit: Payer: Self-pay

## 2023-09-12 DIAGNOSIS — J189 Pneumonia, unspecified organism: Secondary | ICD-10-CM | POA: Diagnosis not present

## 2023-09-12 DIAGNOSIS — Z48815 Encounter for surgical aftercare following surgery on the digestive system: Secondary | ICD-10-CM | POA: Diagnosis not present

## 2023-09-12 DIAGNOSIS — D62 Acute posthemorrhagic anemia: Secondary | ICD-10-CM | POA: Diagnosis not present

## 2023-09-12 DIAGNOSIS — S36113D Laceration of liver, unspecified degree, subsequent encounter: Secondary | ICD-10-CM | POA: Diagnosis not present

## 2023-09-12 DIAGNOSIS — I10 Essential (primary) hypertension: Secondary | ICD-10-CM | POA: Diagnosis not present

## 2023-09-12 DIAGNOSIS — J9811 Atelectasis: Secondary | ICD-10-CM | POA: Diagnosis not present

## 2023-09-12 DIAGNOSIS — M79601 Pain in right arm: Secondary | ICD-10-CM

## 2023-09-12 DIAGNOSIS — I700 Atherosclerosis of aorta: Secondary | ICD-10-CM | POA: Diagnosis not present

## 2023-09-12 DIAGNOSIS — Z95828 Presence of other vascular implants and grafts: Secondary | ICD-10-CM

## 2023-09-12 DIAGNOSIS — S45891D Other specified injury of other specified blood vessels at shoulder and upper arm level, right arm, subsequent encounter: Secondary | ICD-10-CM | POA: Diagnosis not present

## 2023-09-12 DIAGNOSIS — F4310 Post-traumatic stress disorder, unspecified: Secondary | ICD-10-CM | POA: Diagnosis not present

## 2023-09-13 ENCOUNTER — Telehealth: Payer: Self-pay

## 2023-09-13 DIAGNOSIS — D62 Acute posthemorrhagic anemia: Secondary | ICD-10-CM | POA: Diagnosis not present

## 2023-09-13 DIAGNOSIS — I700 Atherosclerosis of aorta: Secondary | ICD-10-CM | POA: Diagnosis not present

## 2023-09-13 DIAGNOSIS — I10 Essential (primary) hypertension: Secondary | ICD-10-CM | POA: Diagnosis not present

## 2023-09-13 DIAGNOSIS — J189 Pneumonia, unspecified organism: Secondary | ICD-10-CM | POA: Diagnosis not present

## 2023-09-13 DIAGNOSIS — S45891D Other specified injury of other specified blood vessels at shoulder and upper arm level, right arm, subsequent encounter: Secondary | ICD-10-CM | POA: Diagnosis not present

## 2023-09-13 DIAGNOSIS — Z48815 Encounter for surgical aftercare following surgery on the digestive system: Secondary | ICD-10-CM | POA: Diagnosis not present

## 2023-09-13 DIAGNOSIS — S36113D Laceration of liver, unspecified degree, subsequent encounter: Secondary | ICD-10-CM | POA: Diagnosis not present

## 2023-09-13 DIAGNOSIS — J9811 Atelectasis: Secondary | ICD-10-CM | POA: Diagnosis not present

## 2023-09-13 DIAGNOSIS — F4310 Post-traumatic stress disorder, unspecified: Secondary | ICD-10-CM | POA: Diagnosis not present

## 2023-09-13 NOTE — Telephone Encounter (Signed)
Received call from [facility] nurse: Andres Shad  Patient took metoprolol 50mg  (TID) and valsartan hydrochlorothiazide at 7am.   BP at 1045am: 150/100  Per Dr. Ashley Royalty, have BP check tomorrow by Home Health and if elevation continues contact the office. Also, pt to schedule Hospital follow-up visit asap.   Pt is aware and will call to schedule following PT.

## 2023-09-14 ENCOUNTER — Ambulatory Visit (INDEPENDENT_AMBULATORY_CARE_PROVIDER_SITE_OTHER): Payer: Medicare HMO | Admitting: Family Medicine

## 2023-09-14 ENCOUNTER — Encounter: Payer: Self-pay | Admitting: Family Medicine

## 2023-09-14 ENCOUNTER — Telehealth: Payer: Self-pay | Admitting: Family Medicine

## 2023-09-14 VITALS — BP 129/83 | HR 60 | Ht 72.0 in | Wt 258.0 lb

## 2023-09-14 DIAGNOSIS — Z23 Encounter for immunization: Secondary | ICD-10-CM | POA: Diagnosis not present

## 2023-09-14 DIAGNOSIS — I10 Essential (primary) hypertension: Secondary | ICD-10-CM | POA: Diagnosis not present

## 2023-09-14 DIAGNOSIS — S36113D Laceration of liver, unspecified degree, subsequent encounter: Secondary | ICD-10-CM | POA: Diagnosis not present

## 2023-09-14 DIAGNOSIS — J9811 Atelectasis: Secondary | ICD-10-CM | POA: Diagnosis not present

## 2023-09-14 DIAGNOSIS — I700 Atherosclerosis of aorta: Secondary | ICD-10-CM | POA: Diagnosis not present

## 2023-09-14 DIAGNOSIS — D62 Acute posthemorrhagic anemia: Secondary | ICD-10-CM | POA: Diagnosis not present

## 2023-09-14 DIAGNOSIS — W3400XA Accidental discharge from unspecified firearms or gun, initial encounter: Secondary | ICD-10-CM

## 2023-09-14 DIAGNOSIS — S45891D Other specified injury of other specified blood vessels at shoulder and upper arm level, right arm, subsequent encounter: Secondary | ICD-10-CM | POA: Diagnosis not present

## 2023-09-14 DIAGNOSIS — Z48815 Encounter for surgical aftercare following surgery on the digestive system: Secondary | ICD-10-CM | POA: Diagnosis not present

## 2023-09-14 DIAGNOSIS — J189 Pneumonia, unspecified organism: Secondary | ICD-10-CM | POA: Diagnosis not present

## 2023-09-14 DIAGNOSIS — F4310 Post-traumatic stress disorder, unspecified: Secondary | ICD-10-CM | POA: Diagnosis not present

## 2023-09-14 MED ORDER — CARVEDILOL 6.25 MG PO TABS: 6.2500 mg | ORAL_TABLET | Freq: Two times a day (BID) | ORAL | 2 refills | Status: DC

## 2023-09-14 NOTE — Telephone Encounter (Signed)
Debroah Loop, Physical Therapist from [place] called in about patients BP, it is "around" 152/90 but no symptoms. Patient has been taking metoprolol TID instead of every 6 hours. Patient has appointment for later today

## 2023-09-14 NOTE — Patient Instructions (Addendum)
Stop metoprolol. Start carvedilol.  Continue valsartan/hydrochlorothiazide.  See me again in 1 month.

## 2023-09-15 DIAGNOSIS — W3400XD Accidental discharge from unspecified firearms or gun, subsequent encounter: Secondary | ICD-10-CM | POA: Diagnosis not present

## 2023-09-15 DIAGNOSIS — S21331D Puncture wound without foreign body of right front wall of thorax with penetration into thoracic cavity, subsequent encounter: Secondary | ICD-10-CM | POA: Diagnosis not present

## 2023-09-18 ENCOUNTER — Encounter: Payer: Self-pay | Admitting: Family Medicine

## 2023-09-18 NOTE — Assessment & Plan Note (Signed)
He had prolonged hospitalization recently.  Seems to be recovering fairly well.  Continues to have limited use of his hands and is working with OT.  Additionally he continues with physical therapy as well for deconditioning.
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
## 2023-09-18 NOTE — Assessment & Plan Note (Signed)
Blood pressure is fairly well-controlled.  Will switch metoprolol to carvedilol to help with better blood pressure control as well as simplify dosing.  He will continue valsartan with hydrochlorothiazide.

## 2023-09-19 DIAGNOSIS — D62 Acute posthemorrhagic anemia: Secondary | ICD-10-CM | POA: Diagnosis not present

## 2023-09-19 DIAGNOSIS — F4310 Post-traumatic stress disorder, unspecified: Secondary | ICD-10-CM | POA: Diagnosis not present

## 2023-09-19 DIAGNOSIS — I10 Essential (primary) hypertension: Secondary | ICD-10-CM | POA: Diagnosis not present

## 2023-09-19 DIAGNOSIS — I700 Atherosclerosis of aorta: Secondary | ICD-10-CM | POA: Diagnosis not present

## 2023-09-19 DIAGNOSIS — S45891D Other specified injury of other specified blood vessels at shoulder and upper arm level, right arm, subsequent encounter: Secondary | ICD-10-CM | POA: Diagnosis not present

## 2023-09-19 DIAGNOSIS — J189 Pneumonia, unspecified organism: Secondary | ICD-10-CM | POA: Diagnosis not present

## 2023-09-19 DIAGNOSIS — S36113D Laceration of liver, unspecified degree, subsequent encounter: Secondary | ICD-10-CM | POA: Diagnosis not present

## 2023-09-19 DIAGNOSIS — Z48815 Encounter for surgical aftercare following surgery on the digestive system: Secondary | ICD-10-CM | POA: Diagnosis not present

## 2023-09-19 DIAGNOSIS — J9811 Atelectasis: Secondary | ICD-10-CM | POA: Diagnosis not present

## 2023-09-20 DIAGNOSIS — F4310 Post-traumatic stress disorder, unspecified: Secondary | ICD-10-CM | POA: Diagnosis not present

## 2023-09-20 DIAGNOSIS — S36113D Laceration of liver, unspecified degree, subsequent encounter: Secondary | ICD-10-CM | POA: Diagnosis not present

## 2023-09-20 DIAGNOSIS — D62 Acute posthemorrhagic anemia: Secondary | ICD-10-CM | POA: Diagnosis not present

## 2023-09-20 DIAGNOSIS — J9811 Atelectasis: Secondary | ICD-10-CM | POA: Diagnosis not present

## 2023-09-20 DIAGNOSIS — S45891D Other specified injury of other specified blood vessels at shoulder and upper arm level, right arm, subsequent encounter: Secondary | ICD-10-CM | POA: Diagnosis not present

## 2023-09-20 DIAGNOSIS — Z48815 Encounter for surgical aftercare following surgery on the digestive system: Secondary | ICD-10-CM | POA: Diagnosis not present

## 2023-09-20 DIAGNOSIS — J189 Pneumonia, unspecified organism: Secondary | ICD-10-CM | POA: Diagnosis not present

## 2023-09-20 DIAGNOSIS — I700 Atherosclerosis of aorta: Secondary | ICD-10-CM | POA: Diagnosis not present

## 2023-09-20 DIAGNOSIS — I10 Essential (primary) hypertension: Secondary | ICD-10-CM | POA: Diagnosis not present

## 2023-09-26 DIAGNOSIS — S36113D Laceration of liver, unspecified degree, subsequent encounter: Secondary | ICD-10-CM | POA: Diagnosis not present

## 2023-09-26 DIAGNOSIS — Z48815 Encounter for surgical aftercare following surgery on the digestive system: Secondary | ICD-10-CM | POA: Diagnosis not present

## 2023-09-26 DIAGNOSIS — J9811 Atelectasis: Secondary | ICD-10-CM | POA: Diagnosis not present

## 2023-09-26 DIAGNOSIS — S45891D Other specified injury of other specified blood vessels at shoulder and upper arm level, right arm, subsequent encounter: Secondary | ICD-10-CM | POA: Diagnosis not present

## 2023-09-26 DIAGNOSIS — F4310 Post-traumatic stress disorder, unspecified: Secondary | ICD-10-CM | POA: Diagnosis not present

## 2023-09-26 DIAGNOSIS — I700 Atherosclerosis of aorta: Secondary | ICD-10-CM | POA: Diagnosis not present

## 2023-09-26 DIAGNOSIS — J189 Pneumonia, unspecified organism: Secondary | ICD-10-CM | POA: Diagnosis not present

## 2023-09-26 DIAGNOSIS — D62 Acute posthemorrhagic anemia: Secondary | ICD-10-CM | POA: Diagnosis not present

## 2023-09-26 DIAGNOSIS — I10 Essential (primary) hypertension: Secondary | ICD-10-CM | POA: Diagnosis not present

## 2023-09-27 DIAGNOSIS — I700 Atherosclerosis of aorta: Secondary | ICD-10-CM | POA: Diagnosis not present

## 2023-09-27 DIAGNOSIS — F4310 Post-traumatic stress disorder, unspecified: Secondary | ICD-10-CM | POA: Diagnosis not present

## 2023-09-27 DIAGNOSIS — I10 Essential (primary) hypertension: Secondary | ICD-10-CM | POA: Diagnosis not present

## 2023-09-27 DIAGNOSIS — S45891D Other specified injury of other specified blood vessels at shoulder and upper arm level, right arm, subsequent encounter: Secondary | ICD-10-CM | POA: Diagnosis not present

## 2023-09-27 DIAGNOSIS — Z48815 Encounter for surgical aftercare following surgery on the digestive system: Secondary | ICD-10-CM | POA: Diagnosis not present

## 2023-09-27 DIAGNOSIS — D62 Acute posthemorrhagic anemia: Secondary | ICD-10-CM | POA: Diagnosis not present

## 2023-09-27 DIAGNOSIS — J189 Pneumonia, unspecified organism: Secondary | ICD-10-CM | POA: Diagnosis not present

## 2023-09-27 DIAGNOSIS — S36113D Laceration of liver, unspecified degree, subsequent encounter: Secondary | ICD-10-CM | POA: Diagnosis not present

## 2023-09-27 DIAGNOSIS — J9811 Atelectasis: Secondary | ICD-10-CM | POA: Diagnosis not present

## 2023-09-28 DIAGNOSIS — I700 Atherosclerosis of aorta: Secondary | ICD-10-CM | POA: Diagnosis not present

## 2023-09-28 DIAGNOSIS — F4310 Post-traumatic stress disorder, unspecified: Secondary | ICD-10-CM | POA: Diagnosis not present

## 2023-09-28 DIAGNOSIS — S36113D Laceration of liver, unspecified degree, subsequent encounter: Secondary | ICD-10-CM | POA: Diagnosis not present

## 2023-09-28 DIAGNOSIS — J189 Pneumonia, unspecified organism: Secondary | ICD-10-CM | POA: Diagnosis not present

## 2023-09-28 DIAGNOSIS — Z48815 Encounter for surgical aftercare following surgery on the digestive system: Secondary | ICD-10-CM | POA: Diagnosis not present

## 2023-09-28 DIAGNOSIS — D62 Acute posthemorrhagic anemia: Secondary | ICD-10-CM | POA: Diagnosis not present

## 2023-09-28 DIAGNOSIS — J9811 Atelectasis: Secondary | ICD-10-CM | POA: Diagnosis not present

## 2023-09-28 DIAGNOSIS — I10 Essential (primary) hypertension: Secondary | ICD-10-CM | POA: Diagnosis not present

## 2023-09-28 DIAGNOSIS — S45891D Other specified injury of other specified blood vessels at shoulder and upper arm level, right arm, subsequent encounter: Secondary | ICD-10-CM | POA: Diagnosis not present

## 2023-10-12 ENCOUNTER — Encounter: Payer: Self-pay | Admitting: Family Medicine

## 2023-10-12 ENCOUNTER — Ambulatory Visit (INDEPENDENT_AMBULATORY_CARE_PROVIDER_SITE_OTHER): Payer: Medicare HMO | Admitting: Family Medicine

## 2023-10-12 VITALS — BP 134/89 | HR 76 | Ht 72.0 in | Wt 257.0 lb

## 2023-10-12 DIAGNOSIS — I10 Essential (primary) hypertension: Secondary | ICD-10-CM | POA: Diagnosis not present

## 2023-10-12 DIAGNOSIS — R29898 Other symptoms and signs involving the musculoskeletal system: Secondary | ICD-10-CM

## 2023-10-12 DIAGNOSIS — N529 Male erectile dysfunction, unspecified: Secondary | ICD-10-CM

## 2023-10-12 DIAGNOSIS — W3400XA Accidental discharge from unspecified firearms or gun, initial encounter: Secondary | ICD-10-CM

## 2023-10-12 MED ORDER — TADALAFIL 10 MG PO TABS: 10.0000 mg | ORAL_TABLET | ORAL | 1 refills | Status: DC | PRN

## 2023-10-12 NOTE — Assessment & Plan Note (Signed)
Adding tadalafil back on.
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
## 2023-10-12 NOTE — Assessment & Plan Note (Signed)
Overall doing well.  Still with b/l hand weakness.  Recommend OT referral for therapy and evaluation for driving safety.

## 2023-10-12 NOTE — Assessment & Plan Note (Signed)
BP is well controlled.  Continue current medications for management of HTN.   

## 2023-10-16 ENCOUNTER — Other Ambulatory Visit: Payer: Self-pay | Admitting: Family Medicine

## 2023-11-02 DIAGNOSIS — H43812 Vitreous degeneration, left eye: Secondary | ICD-10-CM | POA: Diagnosis not present

## 2023-11-02 DIAGNOSIS — H25013 Cortical age-related cataract, bilateral: Secondary | ICD-10-CM | POA: Diagnosis not present

## 2023-11-02 DIAGNOSIS — H2513 Age-related nuclear cataract, bilateral: Secondary | ICD-10-CM | POA: Diagnosis not present

## 2023-11-08 ENCOUNTER — Ambulatory Visit: Payer: Medicare HMO | Attending: Family Medicine | Admitting: Occupational Therapy

## 2023-11-08 DIAGNOSIS — M25621 Stiffness of right elbow, not elsewhere classified: Secondary | ICD-10-CM

## 2023-11-08 DIAGNOSIS — M25611 Stiffness of right shoulder, not elsewhere classified: Secondary | ICD-10-CM

## 2023-11-08 DIAGNOSIS — M25642 Stiffness of left hand, not elsewhere classified: Secondary | ICD-10-CM

## 2023-11-08 DIAGNOSIS — W3400XD Accidental discharge from unspecified firearms or gun, subsequent encounter: Secondary | ICD-10-CM | POA: Insufficient documentation

## 2023-11-08 DIAGNOSIS — M79642 Pain in left hand: Secondary | ICD-10-CM | POA: Diagnosis not present

## 2023-11-08 DIAGNOSIS — R29898 Other symptoms and signs involving the musculoskeletal system: Secondary | ICD-10-CM | POA: Insufficient documentation

## 2023-11-08 DIAGNOSIS — M6281 Muscle weakness (generalized): Secondary | ICD-10-CM

## 2023-11-08 DIAGNOSIS — M25641 Stiffness of right hand, not elsewhere classified: Secondary | ICD-10-CM | POA: Diagnosis not present

## 2023-11-08 DIAGNOSIS — M79641 Pain in right hand: Secondary | ICD-10-CM | POA: Diagnosis not present

## 2023-11-08 NOTE — Patient Instructions (Signed)
Flexor Tendon Gliding (Active Hook Fist) ? ? ?With fingers and knuckles straight, bend middle and tip joints. Do not bend large knuckles. ?Repeat _10-15___ times. Do _3__ sessions per day. ? ?MP Flexion (Active) ? ? ?With back of hand on table, bend large knuckles as far as they will go, keeping small joints straight. ?Repeat _10-15___ times. Do __3 sessions per day. ?Activity: Reach into a narrow container.* ? ?  ?  ?Finger Flexion / Extension ? ? ?With palm up, bend fingers of left hand toward palm, making a  fist. Straighten fingers, opening fist. ?Repeat sequence _10-15___ times per session. Do _3__ sessions per day. ?Hand Variation: Palm down  ? ?Copyright ? [employer]. All rights reserved.   ?
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
## 2023-11-14 ENCOUNTER — Ambulatory Visit: Payer: Medicare HMO | Admitting: Occupational Therapy

## 2023-11-14 DIAGNOSIS — M6281 Muscle weakness (generalized): Secondary | ICD-10-CM

## 2023-11-14 DIAGNOSIS — M25641 Stiffness of right hand, not elsewhere classified: Secondary | ICD-10-CM | POA: Diagnosis not present

## 2023-11-14 DIAGNOSIS — M25642 Stiffness of left hand, not elsewhere classified: Secondary | ICD-10-CM | POA: Diagnosis not present

## 2023-11-14 DIAGNOSIS — M25621 Stiffness of right elbow, not elsewhere classified: Secondary | ICD-10-CM

## 2023-11-14 DIAGNOSIS — R29898 Other symptoms and signs involving the musculoskeletal system: Secondary | ICD-10-CM | POA: Diagnosis not present

## 2023-11-14 DIAGNOSIS — M25611 Stiffness of right shoulder, not elsewhere classified: Secondary | ICD-10-CM | POA: Diagnosis not present

## 2023-11-14 DIAGNOSIS — M79642 Pain in left hand: Secondary | ICD-10-CM | POA: Diagnosis not present

## 2023-11-14 DIAGNOSIS — M79641 Pain in right hand: Secondary | ICD-10-CM
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
## 2023-11-14 NOTE — Patient Instructions (Addendum)
If you use heat on your hands, monitor right hand closely to avoid overheating 5-6 mins may be long enough. You can heat left hand for 8-10 mins if it does not feel too hot.   AROM: PIP Flexion / Extension   Pinch bottom knuckle of ___each_____ finger of hand to prevent bending. Actively bend middle knuckle until stretch is felt. Hold __5__ seconds. Relax. Straighten finger as far as possible. Repeat __10-15__ times per set. Do _2__ sessions per day.   AROM: DIP Flexion / Extension   Pinch middle knuckle of _____each___ finger of  hand to prevent bending. Bend end knuckle until stretch is felt. Hold _5___ seconds. Relax. Straighten finger as far as possible. Repeat _10-15___ times per set.  Do _4-6___ sessions per day.  AROM: Finger Flexion / Extension   Actively bend fingers of  hand. Start with knuckles furthest from palm, and slowly make a fist. Hold __5__ seconds. Relax. Then straighten fingers as far as possible. Repeat _10-15___ times per set.  Do _4_ sessions per day.  Copyright  VHI. All rights reserved.   Putty-in-Your-Hand    Slowly squeeze putty or a soft rubber ball while breathing normally. Repeat with other hand. Repeat __10-20__ times. Do ___1-2_ sessions per day.  http://gt2.exer.us/884   Copyright  VHI. All rights reserved.    Finger / Thumb Activities: Extension    Roll putty into rope shape using all fingers held straight. Hitchhike with thumb up and out.  Copyright  VHI. All rights reserved.  Pinch: Palmar    Pinch putty with right thumb and each fingertip in turn. Repeat ___10-20_ times. Do _1-2___ sessions per day. Activity: Peel fruit such as lemons or oranges.* Peel stickers off surfaces.  Copyright  VHI. All rights reserved.

## 2023-11-16 ENCOUNTER — Other Ambulatory Visit: Payer: Self-pay

## 2023-11-16 ENCOUNTER — Telehealth: Payer: Self-pay | Admitting: Family Medicine

## 2023-11-16 MED ORDER — VITAMIN D3 50 MCG (2000 UT) PO TABS: 4000.0000 [IU] | ORAL_TABLET | Freq: Every day | ORAL | 0 refills | Status: DC

## 2023-11-16 NOTE — Telephone Encounter (Signed)
Copied from CRM [id number]. Topic: Clinical - Medication Refill >> Nov 16, 2023  3:18 PM Nila Nephew wrote: Most Recent Primary Care Visit:  Provider: Everrett Coombe  Department: [facility]  Visit Type: OFFICE VISIT  Date: 10/12/2023  Medication: Vitamin D - Working inside now and would like V D3 - requesting a sufficient dosage  Has the patient contacted their pharmacy? No (Agent: If no, request that the patient contact the pharmacy for the refill. If patient does not wish to contact the pharmacy document the reason why and proceed with request.) (Agent: If yes, when and what did the pharmacy advise?)  Is this the correct pharmacy for this prescription? Yes If no, delete pharmacy and type the correct one.  This is the patient's preferred pharmacy:  [id number] - [place], [place] - [address] AT [facility], [address] [place] [address] Phone: [phone] Fax: [phone]    Has the prescription been filled recently? No  Is the patient out of the medication? No  Has the patient been seen for an appointment in the last year OR does the patient have an upcoming appointment? Yes  Can we respond through MyChart? No - Phone Call  Agent: Please be advised that Rx refills may take up to 3 business days. We ask that you follow-up with your pharmacy.

## 2023-11-17 MED ORDER — VITAMIN D3 50 MCG (2000 UT) PO TABS: 4000.0000 [IU] | ORAL_TABLET | Freq: Every day | ORAL | 0 refills | Status: AC

## 2023-11-17 NOTE — Addendum Note (Signed)
Addended by: Ardyth Man on: 11/17/2023 04:30 PM   Modules accepted: Orders

## 2023-11-23 ENCOUNTER — Ambulatory Visit: Payer: Medicare HMO | Admitting: Occupational Therapy

## 2023-11-23 ENCOUNTER — Encounter: Payer: Self-pay | Admitting: Occupational Therapy

## 2023-11-23 DIAGNOSIS — M25621 Stiffness of right elbow, not elsewhere classified: Secondary | ICD-10-CM | POA: Diagnosis not present

## 2023-11-23 DIAGNOSIS — M25611 Stiffness of right shoulder, not elsewhere classified: Secondary | ICD-10-CM

## 2023-11-23 DIAGNOSIS — M25642 Stiffness of left hand, not elsewhere classified: Secondary | ICD-10-CM

## 2023-11-23 DIAGNOSIS — R29898 Other symptoms and signs involving the musculoskeletal system: Secondary | ICD-10-CM | POA: Diagnosis not present

## 2023-11-23 DIAGNOSIS — M25641 Stiffness of right hand, not elsewhere classified: Secondary | ICD-10-CM | POA: Diagnosis not present

## 2023-11-23 DIAGNOSIS — M79641 Pain in right hand: Secondary | ICD-10-CM

## 2023-11-23 DIAGNOSIS — M79642 Pain in left hand: Secondary | ICD-10-CM | POA: Diagnosis not present

## 2023-11-23 DIAGNOSIS — M6281 Muscle weakness (generalized): Secondary | ICD-10-CM | POA: Diagnosis not present

## 2023-11-23 NOTE — Patient Instructions (Signed)
Shoulder Push-Up (Prone on Elbows)    With elbows placed under shoulders, rise up on elbows as high as possible. Keep hips on surface and back arched. Hold _5___ seconds. Repeat 10____ times. Do __1__ sessions per day.  Copyright  VHI. All rights reserved.   Shoulder: Flexion (Supine)    With hands shoulder width apart, slowly lower dowel to floor behind head. Do not let elbows bend. Keep back flat. Hold __5__ seconds. Repeat __10-20__ times. Do __1-2__ sessions per day.  Repeat for chest press, focus on keeping shoulder blade against the bed 10-20 reps 1-2  CAUTION: Stretch slowly and gently.  Copyright  VHI. All rights reserved.
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
## 2023-11-30 ENCOUNTER — Encounter: Payer: Self-pay | Admitting: Occupational Therapy

## 2023-11-30 ENCOUNTER — Ambulatory Visit: Payer: Medicare HMO | Attending: Family Medicine | Admitting: Occupational Therapy

## 2023-11-30 DIAGNOSIS — M25621 Stiffness of right elbow, not elsewhere classified: Secondary | ICD-10-CM | POA: Diagnosis not present

## 2023-11-30 DIAGNOSIS — M79642 Pain in left hand: Secondary | ICD-10-CM | POA: Diagnosis not present

## 2023-11-30 DIAGNOSIS — M25641 Stiffness of right hand, not elsewhere classified: Secondary | ICD-10-CM

## 2023-11-30 DIAGNOSIS — M25611 Stiffness of right shoulder, not elsewhere classified: Secondary | ICD-10-CM | POA: Diagnosis not present

## 2023-11-30 DIAGNOSIS — M79641 Pain in right hand: Secondary | ICD-10-CM

## 2023-11-30 DIAGNOSIS — M6281 Muscle weakness (generalized): Secondary | ICD-10-CM

## 2023-11-30 DIAGNOSIS — M25642 Stiffness of left hand, not elsewhere classified: Secondary | ICD-10-CM | POA: Diagnosis not present
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
## 2023-12-08 ENCOUNTER — Ambulatory Visit: Payer: Medicare HMO | Admitting: Occupational Therapy
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
## 2023-12-09 ENCOUNTER — Encounter: Payer: Self-pay | Admitting: Occupational Therapy

## 2023-12-09 ENCOUNTER — Ambulatory Visit: Payer: Medicare HMO | Admitting: Occupational Therapy

## 2023-12-09 DIAGNOSIS — M25642 Stiffness of left hand, not elsewhere classified: Secondary | ICD-10-CM | POA: Diagnosis not present

## 2023-12-09 DIAGNOSIS — M79641 Pain in right hand: Secondary | ICD-10-CM | POA: Diagnosis not present

## 2023-12-09 DIAGNOSIS — M25621 Stiffness of right elbow, not elsewhere classified: Secondary | ICD-10-CM | POA: Diagnosis not present

## 2023-12-09 DIAGNOSIS — M25641 Stiffness of right hand, not elsewhere classified: Secondary | ICD-10-CM | POA: Diagnosis not present

## 2023-12-09 DIAGNOSIS — M79642 Pain in left hand: Secondary | ICD-10-CM

## 2023-12-09 DIAGNOSIS — M25611 Stiffness of right shoulder, not elsewhere classified: Secondary | ICD-10-CM

## 2023-12-09 DIAGNOSIS — M6281 Muscle weakness (generalized): Secondary | ICD-10-CM | POA: Diagnosis not present

## 2023-12-15 ENCOUNTER — Ambulatory Visit: Payer: Medicare HMO | Admitting: Occupational Therapy
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
## 2023-12-19 ENCOUNTER — Ambulatory Visit: Payer: Medicare HMO | Admitting: Occupational Therapy

## 2023-12-20 ENCOUNTER — Telehealth: Payer: Self-pay | Admitting: Family Medicine
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
## 2023-12-22 ENCOUNTER — Telehealth: Payer: Self-pay
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
## 2023-12-23 NOTE — Telephone Encounter (Signed)
 Attempted  to call patient to inform. Left a voice mail message requesting a return call.

## 2023-12-26 ENCOUNTER — Telehealth: Payer: Self-pay | Admitting: Family Medicine

## 2023-12-26 NOTE — Telephone Encounter (Signed)
 Copied from CRM [id number]. Topic: General - Other >> Dec 23, 2023  5:08 PM Shelah Lewandowsky wrote: Reason for CRM: returning call from office, no answer at [facility]

## 2023-12-28 ENCOUNTER — Ambulatory Visit: Payer: Medicare HMO | Attending: Family Medicine | Admitting: Occupational Therapy

## 2023-12-28 DIAGNOSIS — M79642 Pain in left hand: Secondary | ICD-10-CM | POA: Diagnosis not present

## 2023-12-28 DIAGNOSIS — M25621 Stiffness of right elbow, not elsewhere classified: Secondary | ICD-10-CM | POA: Insufficient documentation

## 2023-12-28 DIAGNOSIS — M25642 Stiffness of left hand, not elsewhere classified: Secondary | ICD-10-CM | POA: Diagnosis not present

## 2023-12-28 DIAGNOSIS — M79641 Pain in right hand: Secondary | ICD-10-CM | POA: Insufficient documentation

## 2023-12-28 DIAGNOSIS — M25641 Stiffness of right hand, not elsewhere classified: Secondary | ICD-10-CM | POA: Diagnosis not present

## 2023-12-28 DIAGNOSIS — M25611 Stiffness of right shoulder, not elsewhere classified: Secondary | ICD-10-CM | POA: Insufficient documentation
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
## 2023-12-28 NOTE — Patient Instructions (Signed)
    ROM: Abduction - Wand   Holding wand with left hand palm up, push wand directly out to side, leading with other hand palm down, until stretch is felt. Hold 5 seconds. Repeat 10-15 times per set. Do 1 sessions per day.    ROM: Extension - Wand (Standing)   Stand holding wand behind back. Raise arms as far as possible. Repeat 10 times per set.  Do 2-3 sessions per day.      Active Assistive Shoulder External Rotation    SIT with stick at waist level, LEFT palm up, other palm down. Step to side, push forearm out from body with hand palm down, and keep elbows bent. Hold. Side step and return to start position. Perform 10 reps. 2X DAY   Cane Overhead - Sitting   With arms straight, hold cane forward at waist. Raise cane above head. Hold 3 seconds. Repeat 10 times. Do 1 times per day.  (Home) Retraction: Row - Bilateral (Anchor)    Facing anchor, arms reaching forward, pull hands toward stomach, pinching shoulder blades together. Repeat _10-15___ times per set. Do __1__ sets per session. Do __7__ sessions per week.  red band SHOULDER: Extension (Band)    Start with arm slightly forward. Holding band, with both hands pull backward, past hip, keeping elbow straight. Do not swing arm. Hold __5_ seconds. Use ___red_____ band. __10-15_ reps per set, __1_ sets per day, _7__ days per week  Copyright  VHI. All rights reserved.   Copyright  VHI. All rights reserved.

## 2023-12-30 NOTE — Telephone Encounter (Signed)
 Patient states letter that was written did not write him out of work . He states he needs the letter to say that patient can not work due to inablilty to use hands ( he is undergoing hand therapy currently) -  He states he was hoping that the note would keep him out of work anther couple of months.

## 2024-01-01 ENCOUNTER — Encounter: Payer: Self-pay | Admitting: Family Medicine

## 2024-01-02 NOTE — Telephone Encounter (Signed)
Attempted call to patient . Phone rang without answer. Could not leave a voice mail message.

## 2024-01-10 ENCOUNTER — Encounter: Payer: Self-pay | Admitting: Family Medicine

## 2024-01-10 ENCOUNTER — Ambulatory Visit (INDEPENDENT_AMBULATORY_CARE_PROVIDER_SITE_OTHER): Payer: Medicare HMO | Admitting: Family Medicine

## 2024-01-10 VITALS — BP 138/89 | HR 67 | Ht 72.0 in | Wt 258.0 lb

## 2024-01-10 DIAGNOSIS — W3400XA Accidental discharge from unspecified firearms or gun, initial encounter: Secondary | ICD-10-CM | POA: Diagnosis not present

## 2024-01-10 DIAGNOSIS — I10 Essential (primary) hypertension: Secondary | ICD-10-CM | POA: Diagnosis not present

## 2024-01-10 DIAGNOSIS — E785 Hyperlipidemia, unspecified: Secondary | ICD-10-CM | POA: Diagnosis not present

## 2024-01-10 DIAGNOSIS — Z1211 Encounter for screening for malignant neoplasm of colon: Secondary | ICD-10-CM

## 2024-01-10 MED ORDER — CARVEDILOL 6.25 MG PO TABS: 6.2500 mg | ORAL_TABLET | Freq: Two times a day (BID) | ORAL | 2 refills | Status: DC

## 2024-01-10 MED ORDER — TADALAFIL 5 MG PO TABS
5.0000 mg | ORAL_TABLET | ORAL | 3 refills | Status: DC | PRN
Start: 2024-01-10 — End: 2024-04-26

## 2024-01-10 MED ORDER — ATORVASTATIN CALCIUM 20 MG PO TABS: 20.0000 mg | ORAL_TABLET | Freq: Every day | ORAL | 3 refills | Status: AC

## 2024-01-10 NOTE — Assessment & Plan Note (Signed)
 Blood pressure is elevated today.  He has medications at home but is not currently taking.  I recommend he start these and follow-up in about 2 weeks for blood pressure recheck.
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
## 2024-01-10 NOTE — Assessment & Plan Note (Signed)
Tolerating atorvastatin, update lipid panel.
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
## 2024-01-11 ENCOUNTER — Ambulatory Visit: Admitting: Occupational Therapy

## 2024-01-18 ENCOUNTER — Ambulatory Visit: Admitting: Occupational Therapy

## 2024-01-18 DIAGNOSIS — M79642 Pain in left hand: Secondary | ICD-10-CM | POA: Diagnosis not present

## 2024-01-18 DIAGNOSIS — M25621 Stiffness of right elbow, not elsewhere classified: Secondary | ICD-10-CM | POA: Diagnosis not present

## 2024-01-18 DIAGNOSIS — M79641 Pain in right hand: Secondary | ICD-10-CM

## 2024-01-18 DIAGNOSIS — M25642 Stiffness of left hand, not elsewhere classified: Secondary | ICD-10-CM | POA: Diagnosis not present

## 2024-01-18 DIAGNOSIS — M25641 Stiffness of right hand, not elsewhere classified: Secondary | ICD-10-CM | POA: Diagnosis not present

## 2024-01-18 DIAGNOSIS — M25611 Stiffness of right shoulder, not elsewhere classified: Secondary | ICD-10-CM
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
## 2024-01-18 NOTE — Patient Instructions (Signed)
 SHOULDER: Flexion At Wall    Slide both arms up wall plam on towel Maintain upright posture and tuck in stomach. perfrom 1 set of diagonals each direction, and circles, for ach arm separately _10-20__ reps per set, __1_ sets per day, __7_ days per week  Copyright  [employer]. All rights reserved.

## 2024-01-24 ENCOUNTER — Ambulatory Visit

## 2024-01-25 ENCOUNTER — Ambulatory Visit: Attending: Family Medicine | Admitting: Occupational Therapy
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
## 2024-01-31 ENCOUNTER — Ambulatory Visit: Admitting: Occupational Therapy

## 2024-02-23 ENCOUNTER — Ambulatory Visit: Payer: Medicare HMO | Attending: Vascular Surgery | Admitting: Physician Assistant

## 2024-02-23 ENCOUNTER — Ambulatory Visit (HOSPITAL_COMMUNITY)
Admission: RE | Admit: 2024-02-23 | Discharge: 2024-02-23 | Disposition: A | Discharge status: Home / Self Care | Payer: Medicare HMO | Source: Ambulatory Visit | Attending: Vascular Surgery | Admitting: Vascular Surgery

## 2024-02-23 VITALS — BP 155/105 | HR 65 | Temp 98.2°F | Wt 267.7 lb

## 2024-02-23 DIAGNOSIS — Z95828 Presence of other vascular implants and grafts: Secondary | ICD-10-CM

## 2024-02-23 DIAGNOSIS — M79601 Pain in right arm: Secondary | ICD-10-CM
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
## 2024-02-28 ENCOUNTER — Other Ambulatory Visit: Payer: Self-pay | Admitting: Family Medicine

## 2024-02-28 DIAGNOSIS — I10 Essential (primary) hypertension: Secondary | ICD-10-CM

## 2024-04-25 ENCOUNTER — Ambulatory Visit: Payer: Self-pay

## 2024-04-25 DIAGNOSIS — I10 Essential (primary) hypertension: Secondary | ICD-10-CM

## 2024-04-25 NOTE — Telephone Encounter (Signed)
 Copied from CRM [id number]. Topic: Clinical - Medication Question >> Apr 25, 2024  4:04 PM Mercer PEDLAR wrote: Reason for CRM: patient is requesting to increase dosage of tadalafil  (CIALIS ) 5 MG tablet to 10 mg. Would like a call to let him know if that is possible.   [id number] [address], [place] - [address] AT [facility] OF [address] [place] [address] Phone: [phone] Fax: [phone]

## 2024-04-25 NOTE — Telephone Encounter (Signed)
 Patient requesting to increase cialis  strength from 5mg  to 10mg   Last written as 5mg  01/10/2024 Last OV 01/10/2024 Upcoming appt 07/12/2024

## 2024-04-26 MED ORDER — TADALAFIL 10 MG PO TABS: 10.0000 mg | ORAL_TABLET | ORAL | 3 refills | Status: AC | PRN

## 2024-04-26 NOTE — Addendum Note (Signed)
 Addended by: Kimberlly Norgard E on: 04/26/2024 04:23 PM   Modules accepted: Orders

## 2024-06-04 ENCOUNTER — Other Ambulatory Visit: Payer: Self-pay | Admitting: Family Medicine

## 2024-06-04 DIAGNOSIS — I10 Essential (primary) hypertension: Secondary | ICD-10-CM

## 2024-06-26 ENCOUNTER — Encounter: Payer: Self-pay | Admitting: Sports Medicine

## 2024-07-10 ENCOUNTER — Ambulatory Visit: Admitting: Orthopedic Surgery

## 2024-07-10 ENCOUNTER — Encounter: Payer: Self-pay | Admitting: Orthopedic Surgery

## 2024-07-10 DIAGNOSIS — Z89511 Acquired absence of right leg below knee: Secondary | ICD-10-CM | POA: Diagnosis not present
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
## 2024-07-12 ENCOUNTER — Encounter: Payer: Self-pay | Admitting: Family Medicine

## 2024-07-12 ENCOUNTER — Ambulatory Visit (INDEPENDENT_AMBULATORY_CARE_PROVIDER_SITE_OTHER): Admitting: Family Medicine

## 2024-07-12 VITALS — BP 143/88 | HR 63 | Ht 72.0 in | Wt 282.0 lb

## 2024-07-12 DIAGNOSIS — R739 Hyperglycemia, unspecified: Secondary | ICD-10-CM | POA: Diagnosis not present

## 2024-07-12 DIAGNOSIS — Z23 Encounter for immunization: Secondary | ICD-10-CM | POA: Diagnosis not present

## 2024-07-12 DIAGNOSIS — Z125 Encounter for screening for malignant neoplasm of prostate: Secondary | ICD-10-CM

## 2024-07-12 DIAGNOSIS — W3400XA Accidental discharge from unspecified firearms or gun, initial encounter: Secondary | ICD-10-CM | POA: Diagnosis not present

## 2024-07-12 DIAGNOSIS — I10 Essential (primary) hypertension: Secondary | ICD-10-CM | POA: Diagnosis not present

## 2024-07-12 DIAGNOSIS — E785 Hyperlipidemia, unspecified: Secondary | ICD-10-CM

## 2024-07-12 MED ORDER — CARVEDILOL 6.25 MG PO TABS: 6.2500 mg | ORAL_TABLET | Freq: Two times a day (BID) | ORAL | 2 refills | Status: AC

## 2024-07-12 NOTE — Assessment & Plan Note (Signed)
 Continue valsartan /hydrochlorothiazide  and adding coreg  back on.  Updated rx sent in.  3 week follow up for BP check
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
## 2024-07-12 NOTE — Assessment & Plan Note (Signed)
Tolerating atorvastatin, update lipid panel.

## 2024-07-12 NOTE — Assessment & Plan Note (Signed)
 Overall doing well.  He has made pretty good recovery.  Still with some mild hand weakness but he has resumed working some.

## 2024-07-13 LAB — CBC WITH DIFFERENTIAL/PLATELET
Basophils Absolute: 0 x10E3/uL (ref 0.0–0.2)
Basos: 1 %
EOS (ABSOLUTE): 0.2 x10E3/uL (ref 0.0–0.4)
Eos: 3 %
Hematocrit: 43.8 % (ref 37.5–51.0)
Hemoglobin: 13.6 g/dL (ref 13.0–17.7)
Immature Grans (Abs): 0 x10E3/uL (ref 0.0–0.1)
Immature Granulocytes: 0 %
Lymphocytes Absolute: 2.1 x10E3/uL (ref 0.7–3.1)
Lymphs: 36 %
MCH: 26.8 pg (ref 26.6–33.0)
MCHC: 31.1 g/dL — ABNORMAL LOW (ref 31.5–35.7)
MCV: 86 fL (ref 79–97)
Monocytes Absolute: 0.5 x10E3/uL (ref 0.1–0.9)
Monocytes: 9 %
Neutrophils Absolute: 2.9 x10E3/uL (ref 1.4–7.0)
Neutrophils: 51 %
Platelets: 173 x10E3/uL (ref 150–450)
RBC: 5.07 x10E6/uL (ref 4.14–5.80)
RDW: 13.1 % (ref 11.6–15.4)
WBC: 5.8 x10E3/uL (ref 3.4–10.8)

## 2024-07-13 LAB — LIPID PANEL WITH LDL/HDL RATIO
Cholesterol, Total: 168 mg/dL (ref 100–199)
HDL: 51 mg/dL (ref >39)
LDL Chol Calc (NIH): 102 mg/dL — ABNORMAL HIGH (ref 0–99)
LDL/HDL Ratio: 2 ratio (ref 0.0–3.6)
Triglycerides: 82 mg/dL (ref 0–149)
VLDL Cholesterol Cal: 15 mg/dL (ref 5–40)

## 2024-07-13 LAB — CMP14+EGFR
ALT: 11 IU/L (ref 0–44)
AST: 21 IU/L (ref 0–40)
Albumin: 4.3 g/dL (ref 3.9–4.9)
Alkaline Phosphatase: 66 IU/L (ref 47–123)
BUN/Creatinine Ratio: 15 (ref 10–24)
BUN: 18 mg/dL (ref 8–27)
Bilirubin Total: 0.6 mg/dL (ref 0.0–1.2)
CO2: 22 mmol/L (ref 20–29)
Calcium: 9.4 mg/dL (ref 8.6–10.2)
Chloride: 102 mmol/L (ref 96–106)
Creatinine, Ser: 1.23 mg/dL (ref 0.76–1.27)
Globulin, Total: 3 g/dL (ref 1.5–4.5)
Glucose: 91 mg/dL (ref 70–99)
Potassium: 4.3 mmol/L (ref 3.5–5.2)
Sodium: 138 mmol/L (ref 134–144)
Total Protein: 7.3 g/dL (ref 6.0–8.5)
eGFR: 64 mL/min/1.73 (ref >59)

## 2024-07-13 LAB — HEMOGLOBIN A1C
Est. average glucose Bld gHb Est-mCnc: 97 mg/dL
Hgb A1c MFr Bld: 5 % (ref 4.8–5.6)

## 2024-07-13 LAB — PSA: Prostate Specific Ag, Serum: 1.3 ng/mL (ref 0.0–4.0)

## 2024-07-27 DIAGNOSIS — S61204A Unspecified open wound of right ring finger without damage to nail, initial encounter: Secondary | ICD-10-CM | POA: Diagnosis not present

## 2024-07-27 DIAGNOSIS — Z87828 Personal history of other (healed) physical injury and trauma: Secondary | ICD-10-CM | POA: Diagnosis not present

## 2024-07-28 DIAGNOSIS — S61204A Unspecified open wound of right ring finger without damage to nail, initial encounter: Secondary | ICD-10-CM | POA: Diagnosis not present

## 2024-07-30 ENCOUNTER — Telehealth: Payer: Self-pay

## 2024-07-30 ENCOUNTER — Ambulatory Visit: Payer: Self-pay | Admitting: Family Medicine

## 2024-07-30 NOTE — Telephone Encounter (Signed)
 Copied from CRM [id number]. Topic: General - Other >> Jul 30, 2024 12:16 PM Aleatha C wrote: Reason for CRM: Patient  went to [facility] 9/29 for his finger and they wanted him to follow up with PC, he is going to upload pictures

## 2024-08-01 ENCOUNTER — Ambulatory Visit: Payer: Self-pay

## 2024-08-01 NOTE — Telephone Encounter (Signed)
 FYI Only or Action Required?: FYI only for provider.  Patient was last seen in primary care on 07/12/2024 by Alvia Bring, DO.  Called Nurse Triage reporting [place].  Symptoms began a week ago.  Interventions attempted: OTC medications: Wart remover.  Symptoms are: gradually worsening.  Triage Disposition: See Physician Within 24 Hours  Patient/caregiver understands and will follow disposition?: Yes Reason for Disposition  Looks like a boil, infected sore, deep ulcer or other infected rash  Answer Assessment - Initial Assessment Questions Thought it was a wart or a small bump at first, started getting larger and bleeding the other day. Pt bought wart remover and tried that yesterday.   1. APPEARANCE of SWELLING: What does it look like?     Circular, in between firm and squishy  2. SIZE: How large is the swelling? (e.g., inches, cm; or compare to size of pinhead, tip of pen, eraser, coin, pea, grape, ping pong ball)      About the size of a large pea  3. LOCATION: Where is the swelling located?     Right ring finger  4. ONSET: When did the swelling start?     Noticed it 3 weeks ago  5. COLOR: What color is it? Is there more than one color?     Skin colored   6. PAIN: Is there any pain? If Yes, ask: How bad is the pain? (Scale 1-10; or mild, moderate, severe)       Painful to the touch - 7 or 8/10  7. ITCH: Does it itch? If Yes, ask: How bad is the itch?      Denies  8. CAUSE: What do you think caused the swelling?     Unsure  9 OTHER SYMPTOMS: Do you have any other symptoms? (e.g., fever)     4 days ago it started bleeding, bleeding stopped and now leaking clear drainage.  Protocols used: Skin Lump or Localized Swelling-A-AH  Copied from CRM #[id number]. Topic: Clinical - Red Word Triage >> Aug 01, 2024  1:21 PM Donna BRAVO wrote: Red Word that prompted transfer to Nurse Triage: patient has a lump on right hand ring finger, the lump has gotten  bigger, has been bleeding for 3 days, bleeding stopped yesterday.

## 2024-08-02 ENCOUNTER — Encounter: Payer: Self-pay | Admitting: Family Medicine

## 2024-08-02 ENCOUNTER — Ambulatory Visit (INDEPENDENT_AMBULATORY_CARE_PROVIDER_SITE_OTHER): Admitting: Family Medicine

## 2024-08-02 VITALS — BP 166/94 | HR 76 | Ht 72.0 in | Wt 288.0 lb

## 2024-08-02 DIAGNOSIS — L989 Disorder of the skin and subcutaneous tissue, unspecified: Secondary | ICD-10-CM | POA: Insufficient documentation

## 2024-08-02 MED ORDER — DOXYCYCLINE HYCLATE 100 MG PO TABS: 100.0000 mg | ORAL_TABLET | Freq: Two times a day (BID) | ORAL | 0 refills | Status: DC

## 2024-08-02 NOTE — Progress Notes (Signed)
 Russell Garcia - 68 y.o. male MRN [id number]  Date of birth: September 04, 1956  Subjective Chief Complaint  Patient presents with   Finger Injury    HPI Russell Garcia is a 68 y.o. male here today with complaint of finger pain.  He has lesion along the pad of the distal phalanx of the 4th digit.  Area has been enlarging. He has bleeding around the base of the lesion.  He is unsure how long the area has been here because he has decreased feeling in this hand.  He was seen in the ED on 10/4 where the lesion was cleansed and dressed.  He does have some pain. No purulent drainage.   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 Joshua)   BYPASS AXILLA/BRACHIAL ARTERY Right 06/08/2023   Procedure: BYPASS AXILLA/BRACHIAL ARTERY;  Surgeon: Lanis Fonda BRAVO, MD;  Location: [facility] OR;  Service: Vascular;  Laterality: Right;   INGUINAL HERNIA REPAIR     LAPAROTOMY N/A 06/10/2023   Procedure: EXPLORATION LAPAROTOMY;  Surgeon: Paola Dreama SAILOR, MD;  Location: [facility] OR;  Service: General;  Laterality: N/A;   LAPAROTOMY N/A 06/08/2023   Procedure: EXPLORATION LAPAROTOMY;  Surgeon: Dasie Leonor CROME, MD;  Location: [facility] OR;  Service: General;  Laterality: N/A;   PARTIAL COLECTOMY  06/08/2023   Procedure: PARTIAL COLECTOMY;  Surgeon: Dasie Leonor CROME, MD;  Location: [facility] OR;  Service: General;;   R BKA      THROMBECTOMY BRACHIAL ARTERY Right 06/08/2023   Procedure: THROMBECTOMY BRACHIAL ARTERY;  Surgeon: Lanis Fonda BRAVO, MD;  Location: [facility] OR;  Service: Vascular;  Laterality: Right;   TOTAL HIP ARTHROPLASTY Right 06/07/2019   TOTAL HIP ARTHROPLASTY Right 06/07/2019   Procedure: RIGHT TOTAL HIP ARTHROPLASTY ANTERIOR APPROACH;  Surgeon: Vernetta Lonni GRADE, MD;  Location: [facility] OR;  Service: Orthopedics;  Laterality: Right;   TRACHEOSTOMY TUBE PLACEMENT N/A 07/01/2023   Procedure: TRACHEOSTOMY;  Surgeon: Sebastian Moles, MD;  Location: [facility] OR;  Service: General;  Laterality: N/A;    Social History   Socioeconomic History   Marital status: Single    Spouse name: Not on file   Number of children: 4   Years of education: 14   Highest education level: Some college, no degree  Occupational History   Occupation: Education administrator   Occupation: Retired  Tobacco Use   Smoking status: Never   Smokeless tobacco: Never  Advertising account planner   Vaping status: Never Used  Substance and Sexual Activity   Alcohol use: Not Currently   Drug use: Not Currently   Sexual activity: Yes    Partners: Female  Other Topics Concern   Not on file  Social History Narrative   ** Merged History Encounter **       Lives  with significant other. He has four children. He enjoys watching movies and fishing.    Social Drivers of Corporate investment banker Strain: High Risk (04/05/2023)   Overall Financial Resource Strain (CARDIA)    Difficulty of Paying Living Expenses: Very hard  Food Insecurity: No Food Insecurity (04/05/2023)   Hunger Vital Sign    Worried About Running Out of Food in the Last Year: Never true    Ran Out of Food in the Last Year: Never true  Transportation Needs: No Transportation Needs (04/05/2023)   PRAPARE - Administrator, Civil Service (Medical): No    Lack of Transportation (Non-Medical): No  Physical Activity: Sufficiently Active (04/05/2023)   Exercise Vital Sign    Days of Exercise per Week:  3 days    Minutes of Exercise per Session: 60 min  Stress: No Stress Concern Present (04/05/2023)   [employer] of Occupational Health - Occupational Stress Questionnaire    Feeling of Stress : Not at all  Social Connections: Moderately Integrated (04/05/2023)   Social Connection and Isolation Panel    Frequency of Communication with Friends and Family: More than three times a week    Frequency of Social Gatherings with Friends and Family: Once a week    Attends Religious Services: More than 4 times per year    Active Member of Clubs or Organizations: No    Attends Banker Meetings: Never    Marital Status: Living with partner    Family History  Problem Relation Age of Onset   Leukemia Father    Heart attack Mother 56   Diabetes Neg Hx    Colon cancer Neg Hx    Prostate cancer Neg Hx     Health Maintenance  Topic Date Due   Hepatitis C Screening  Never done   Medicare Annual Wellness (AWV)  04/04/2024   COVID-19 Vaccine (3 - 2025-26 season) 06/25/2024   Colonoscopy  11/22/2024   Zoster Vaccines- Shingrix (1 of 2) 10/11/2024 (Originally 08/28/2006)   DTaP/Tdap/Td (5 - Td or Tdap) 06/15/2026   Pneumococcal Vaccine: 50+ Years  Completed   Influenza Vaccine  Completed   Meningococcal B Vaccine  Aged Out     ----------------------------------------------------------------------------------------------------------------------------------------------------------------------------------------------------------------- Physical Exam BP (!) 166/94 (BP Location: Left Arm, Patient Position: Sitting, Cuff Size: Large)   Pulse 76   Ht 6' (1.829 m)   Wt 288 lb (130.6 kg)   SpO2 98%   BMI 39.06 kg/m   Physical Exam Constitutional:      Appearance: Normal appearance.  Skin:    Comments: Pedunculated lesion of the 4th digit of R hand.  Bleeding around the base of the lesion.  Surrounding maceration from bandage.   Neurological:     Mental Status: He is alert.      ------------------------------------------------------------------------------------------------------------------------------------------------------------------------------------------------------------------- Assessment and Plan  Skin lesion of hand BCC/SCC vs Pyogenic granuloma.  There appears to be quite a bit of vascularization around this area and I would be concerned about excessive bleeding if removed in the clinic setting here.  Will refer to hand surgeon for complete excision. Discussed briefly with Dr. Murrell and will add a course of doxycycline as well.    Meds ordered this encounter  Medications   doxycycline (VIBRA-TABS) 100 MG tablet    Sig: Take 1 tablet (100 mg total) by mouth 2 (two) times daily.    Dispense:  20 tablet    Refill:  0    No follow-ups on file.

## 2024-08-02 NOTE — Assessment & Plan Note (Addendum)
 BCC/SCC vs Pyogenic granuloma.  There appears to be quite a bit of vascularization around this area and I would be concerned about excessive bleeding if removed in the clinic setting here.  Will refer to hand surgeon for complete excision. Discussed briefly with Dr. Murrell and will add a course of doxycycline as well.

## 2024-08-03 ENCOUNTER — Telehealth: Payer: Self-pay

## 2024-08-03 NOTE — Telephone Encounter (Signed)
 Copied from CRM #[id number]. Topic: Referral - Question >> Aug 03, 2024  3:36 PM Delon DASEN wrote: Reason for CRM: Sari with [facility] received referral but they do not hands. Not able to see this patient for hands.

## 2024-08-14 ENCOUNTER — Other Ambulatory Visit: Payer: Self-pay | Admitting: Orthopedic Surgery

## 2024-08-14 DIAGNOSIS — L980 Pyogenic granuloma: Secondary | ICD-10-CM | POA: Diagnosis not present

## 2024-08-15 ENCOUNTER — Other Ambulatory Visit: Payer: Self-pay

## 2024-08-15 ENCOUNTER — Encounter (HOSPITAL_BASED_OUTPATIENT_CLINIC_OR_DEPARTMENT_OTHER): Payer: Self-pay | Admitting: Orthopedic Surgery

## 2024-08-15 ENCOUNTER — Encounter (HOSPITAL_BASED_OUTPATIENT_CLINIC_OR_DEPARTMENT_OTHER)
Admission: RE | Admit: 2024-08-15 | Discharge: 2024-08-15 | Disposition: A | Discharge status: Home / Self Care | Source: Ambulatory Visit | Attending: Orthopedic Surgery | Admitting: Orthopedic Surgery

## 2024-08-15 DIAGNOSIS — R9431 Abnormal electrocardiogram [ECG] [EKG]: Secondary | ICD-10-CM | POA: Insufficient documentation

## 2024-08-15 DIAGNOSIS — I10 Essential (primary) hypertension: Secondary | ICD-10-CM | POA: Insufficient documentation

## 2024-08-15 DIAGNOSIS — Z01810 Encounter for preprocedural cardiovascular examination: Secondary | ICD-10-CM | POA: Insufficient documentation

## 2024-08-15 DIAGNOSIS — R001 Bradycardia, unspecified: Secondary | ICD-10-CM | POA: Insufficient documentation

## 2024-08-15 DIAGNOSIS — Z539 Procedure and treatment not carried out, unspecified reason: Secondary | ICD-10-CM | POA: Diagnosis not present

## 2024-08-15 DIAGNOSIS — L980 Pyogenic granuloma: Secondary | ICD-10-CM | POA: Diagnosis not present

## 2024-08-15 LAB — BASIC METABOLIC PANEL WITH GFR
Anion gap: 8 (ref 5–15)
BUN: 17 mg/dL (ref 8–23)
CO2: 25 mmol/L (ref 22–32)
Calcium: 9.2 mg/dL (ref 8.9–10.3)
Chloride: 103 mmol/L (ref 98–111)
Creatinine, Ser: 1.19 mg/dL (ref 0.61–1.24)
GFR, Estimated: >60 mL/min (ref >60)
Glucose, Bld: 91 mg/dL (ref 70–99)
Potassium: 4 mmol/L (ref 3.5–5.1)
Sodium: 136 mmol/L (ref 135–145)

## 2024-08-15 NOTE — Progress Notes (Signed)
  Ensure presurgery drink given with written/verbal instruction to complete by 1000 DOS         Enhanced Recovery after Surgery for Orthopedics Enhanced Recovery after Surgery is a protocol used to improve the stress on your body and your recovery after surgery.  Patient Instructions  The night before surgery:  No food after midnight. ONLY clear liquids after midnight  The day of surgery (if you do NOT have diabetes):  Drink ONE (1) Pre-Surgery Clear Ensure as directed.   This drink was given to you during your hospital  pre-op appointment visit. The pre-op nurse will instruct you on the time to drink the  Pre-Surgery Ensure depending on your surgery time. Finish the drink at the designated time by the pre-op nurse.  Nothing else to drink after completing the  Pre-Surgery Clear Ensure.  The day of surgery (if you have diabetes): Drink ONE (1) Gatorade 2 (G2) as directed. This drink was given to you during your hospital  pre-op appointment visit.  The pre-op nurse will instruct you on the time to drink the   Gatorade 2 (G2) depending on your surgery time. Color of the Gatorade may vary. Red is not allowed. Nothing else to drink after completing the  Gatorade 2 (G2).         If you have questions, please contact your surgeon's office.

## 2024-08-16 ENCOUNTER — Ambulatory Visit (HOSPITAL_BASED_OUTPATIENT_CLINIC_OR_DEPARTMENT_OTHER): Admitting: Anesthesiology

## 2024-08-16 ENCOUNTER — Encounter (HOSPITAL_BASED_OUTPATIENT_CLINIC_OR_DEPARTMENT_OTHER): Payer: Self-pay | Admitting: Orthopedic Surgery

## 2024-08-16 ENCOUNTER — Encounter (HOSPITAL_BASED_OUTPATIENT_CLINIC_OR_DEPARTMENT_OTHER): Admission: RE | Disposition: A | Payer: Self-pay | Attending: Orthopedic Surgery

## 2024-08-16 ENCOUNTER — Ambulatory Visit (HOSPITAL_BASED_OUTPATIENT_CLINIC_OR_DEPARTMENT_OTHER)
Admission: RE | Admit: 2024-08-16 | Discharge: 2024-08-16 | Disposition: A | Discharge status: Home / Self Care | Attending: Orthopedic Surgery | Admitting: Orthopedic Surgery

## 2024-08-16 ENCOUNTER — Other Ambulatory Visit: Payer: Self-pay

## 2024-08-16 DIAGNOSIS — L980 Pyogenic granuloma: Secondary | ICD-10-CM | POA: Diagnosis not present

## 2024-08-16 DIAGNOSIS — I10 Essential (primary) hypertension: Secondary | ICD-10-CM

## 2024-08-16 DIAGNOSIS — Z539 Procedure and treatment not carried out, unspecified reason: Secondary | ICD-10-CM | POA: Insufficient documentation

## 2024-08-16 SURGERY — EXCISION METACARPAL MASS
Anesthesia: Choice | Site: Ring Finger | Laterality: Right

## 2024-08-16 MED ORDER — BUPIVACAINE HCL (PF) 0.25 % IJ SOLN
INTRAMUSCULAR | Status: AC
  Filled 2024-08-16: qty 30

## 2024-08-16 MED ORDER — LACTATED RINGERS IV SOLN: INTRAVENOUS | Status: DC

## 2024-08-16 NOTE — Progress Notes (Signed)
 Surgery Cancelled due to no longer needed surgery

## 2024-08-16 NOTE — H&P (Signed)
 Russell Garcia is an 68 y.o. male.   Chief Complaint: pyogenic granuloma HPI: 68 yo male with right ring finger pyogenic granuloma.  It has been enlarging and bleeds easily.  He wishes to have it removed.  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 Joshua)   BYPASS AXILLA/BRACHIAL ARTERY Right 06/08/2023   Procedure: BYPASS AXILLA/BRACHIAL ARTERY;  Surgeon: Lanis Fonda BRAVO, MD;  Location: [facility] OR;  Service: Vascular;  Laterality: Right;   INGUINAL HERNIA REPAIR     LAPAROTOMY N/A 06/10/2023   Procedure: EXPLORATION LAPAROTOMY;  Surgeon: Paola Dreama SAILOR, MD;  Location: [facility] OR;  Service: General;  Laterality: N/A;   LAPAROTOMY N/A 06/08/2023   Procedure: EXPLORATION LAPAROTOMY;  Surgeon: Dasie Leonor CROME, MD;  Location: [facility] OR;  Service: General;  Laterality: N/A;   PARTIAL COLECTOMY  06/08/2023   Procedure: PARTIAL COLECTOMY;  Surgeon: Dasie Leonor CROME, MD;  Location: [facility] OR;  Service: General;;   R BKA     THROMBECTOMY BRACHIAL ARTERY Right 06/08/2023   Procedure: THROMBECTOMY BRACHIAL ARTERY;  Surgeon: Lanis Fonda BRAVO, MD;  Location: [facility] OR;  Service: Vascular;  Laterality: Right;   TOTAL HIP ARTHROPLASTY Right 06/07/2019   TOTAL HIP ARTHROPLASTY Right 06/07/2019   Procedure: RIGHT TOTAL HIP ARTHROPLASTY ANTERIOR APPROACH;  Surgeon: Vernetta Lonni GRADE, MD;  Location: [facility] OR;  Service: Orthopedics;  Laterality: Right;   TRACHEOSTOMY TUBE PLACEMENT N/A 07/01/2023    Procedure: TRACHEOSTOMY;  Surgeon: Sebastian Moles, MD;  Location: [facility] OR;  Service: General;  Laterality: N/A;    Family History: Family History  Problem Relation Age of Onset   Leukemia Father    Heart attack Mother 64   Diabetes Neg Hx    Colon cancer Neg Hx    Prostate cancer Neg Hx     Social History:   reports that he has never smoked. He has never used smokeless tobacco. He reports that he does not currently use alcohol. He reports that he does not currently use drugs.  Medications: No medications prior to admission.    Results for orders placed or performed during the hospital encounter of 08/16/24 (from the past 48 hours)  Basic metabolic panel per protocol     Status: None   Collection Time: 08/15/24 11:00 AM  Result Value Ref Range   Sodium 136 135 - 145 mmol/L   Potassium 4.0 3.5 - 5.1 mmol/L   Chloride 103 98 - 111 mmol/L   CO2 25 22 - 32 mmol/L   Glucose, Bld 91 70 - 99 mg/dL    Comment: Glucose reference range applies only to samples taken after fasting for at least 8 hours.   BUN 17 8 - 23 mg/dL   Creatinine, Ser 8.80 0.61 - 1.24 mg/dL   Calcium  9.2 8.9 - 10.3 mg/dL   GFR, Estimated >39 >39 mL/min    Comment: (NOTE) Calculated using the  CKD-EPI Creatinine Equation (2021)    Anion gap 8 5 - 15    Comment: Performed at [facility], [address], [place], [place] [address]    No results found.    Height 6' (1.829 m), weight 130.6 kg.  General appearance: alert, cooperative, and appears stated age Head: Normocephalic, without obvious abnormality, atraumatic Neck: supple, symmetrical, trachea midline Extremities: Intact sensation and capillary refill all digits.  +epl/fpl/io.  Pyogenic granuloma at volar aspect right ring finger Skin: Skin color, texture, turgor normal. No rashes or lesions Neurologic: Grossly normal Incision/Wound: as above  Assessment/Plan Right ring finger pyogenic granuloma.  Non operative and operative treatment options  have been discussed with the patient and patient wishes to proceed with operative treatment. Risks, benefits and alternatives of surgery were discussed including risks of blood loss, infection, damage to nerves/vessels/tendons/ligament/bone, failure of surgery, need for additional surgery, complication with wound healing, stiffness, recurrence.  He voiced understanding of these risks and elected to proceed.    Russell Garcia 08/16/2024, 9:31 AM

## 2024-08-17 LAB — SURGICAL PATHOLOGY

## 2024-08-23 DIAGNOSIS — L980 Pyogenic granuloma: Secondary | ICD-10-CM | POA: Diagnosis not present

## 2024-08-27 ENCOUNTER — Encounter: Payer: Self-pay | Admitting: Radiology

## 2024-08-29 ENCOUNTER — Other Ambulatory Visit: Payer: Self-pay | Admitting: Family Medicine

## 2024-08-29 DIAGNOSIS — I10 Essential (primary) hypertension: Secondary | ICD-10-CM

## 2024-10-05 ENCOUNTER — Telehealth: Payer: Self-pay

## 2024-10-05 NOTE — Progress Notes (Signed)
° °  10/05/2024  Patient ID: Alric JINNY Pizza, male   DOB: November 21, 1955, 68 y.o.   MRN: [id number]  This patient is appearing on a report for being at risk of failing the adherence measure for cholesterol (statin) medications this calendar year.   Medication: atorvastatin  20mg  daily  Last fill date: 05/20/24 for 90 day supply  Contacted pharmacy to facilitate refills.  Channing DELENA Mealing, PharmD, DPLA
